# Patient Record
Sex: Female | Born: 1955 | Race: Black or African American | Hispanic: No | Marital: Married | Smoking: Former smoker
Health system: Southern US, Community
[De-identification: ages and names within clinical notes are randomized; demographics above are authoritative.]

## PROBLEM LIST (undated history)

## (undated) DIAGNOSIS — F410 Panic disorder [episodic paroxysmal anxiety] without agoraphobia: Secondary | ICD-10-CM

## (undated) DIAGNOSIS — M1990 Unspecified osteoarthritis, unspecified site: Secondary | ICD-10-CM

## (undated) DIAGNOSIS — E119 Type 2 diabetes mellitus without complications: Secondary | ICD-10-CM

## (undated) DIAGNOSIS — I10 Essential (primary) hypertension: Secondary | ICD-10-CM

## (undated) DIAGNOSIS — E7800 Pure hypercholesterolemia, unspecified: Secondary | ICD-10-CM

## (undated) HISTORY — PX: ABDOMINAL HYSTERECTOMY: SHX81

## (undated) HISTORY — PX: CHOLECYSTECTOMY: SHX55

## (undated) HISTORY — PX: TUBAL LIGATION: SHX77

---

## 1978-04-18 HISTORY — PX: TUBAL LIGATION: SHX77

## 1997-07-17 ENCOUNTER — Ambulatory Visit (HOSPITAL_COMMUNITY): Admission: RE | Admit: 1997-07-17 | Discharge: 1997-07-17 | Payer: Self-pay | Admitting: Internal Medicine

## 1998-03-18 ENCOUNTER — Other Ambulatory Visit: Admission: RE | Admit: 1998-03-18 | Discharge: 1998-03-18 | Payer: Self-pay | Admitting: Gynecology

## 1998-08-13 ENCOUNTER — Encounter: Payer: Self-pay | Admitting: Internal Medicine

## 1998-08-13 ENCOUNTER — Ambulatory Visit (HOSPITAL_COMMUNITY): Admission: RE | Admit: 1998-08-13 | Discharge: 1998-08-13 | Payer: Self-pay | Admitting: Internal Medicine

## 1998-09-28 ENCOUNTER — Emergency Department (HOSPITAL_COMMUNITY): Admission: EM | Admit: 1998-09-28 | Discharge: 1998-09-28 | Payer: Self-pay | Admitting: Emergency Medicine

## 1998-09-28 ENCOUNTER — Encounter: Payer: Self-pay | Admitting: Emergency Medicine

## 1999-06-15 ENCOUNTER — Other Ambulatory Visit: Admission: RE | Admit: 1999-06-15 | Discharge: 1999-06-15 | Payer: Self-pay | Admitting: Obstetrics and Gynecology

## 1999-08-16 ENCOUNTER — Ambulatory Visit (HOSPITAL_COMMUNITY): Admission: RE | Admit: 1999-08-16 | Discharge: 1999-08-16 | Payer: Self-pay | Admitting: Internal Medicine

## 1999-08-16 ENCOUNTER — Encounter: Payer: Self-pay | Admitting: Internal Medicine

## 2000-08-24 ENCOUNTER — Encounter: Payer: Self-pay | Admitting: Internal Medicine

## 2000-08-24 ENCOUNTER — Ambulatory Visit (HOSPITAL_COMMUNITY): Admission: RE | Admit: 2000-08-24 | Discharge: 2000-08-24 | Payer: Self-pay | Admitting: Internal Medicine

## 2000-09-16 ENCOUNTER — Encounter: Payer: Self-pay | Admitting: Emergency Medicine

## 2000-09-16 ENCOUNTER — Emergency Department (HOSPITAL_COMMUNITY): Admission: EM | Admit: 2000-09-16 | Discharge: 2000-09-16 | Payer: Self-pay | Admitting: Emergency Medicine

## 2000-09-19 ENCOUNTER — Encounter (INDEPENDENT_AMBULATORY_CARE_PROVIDER_SITE_OTHER): Payer: Self-pay | Admitting: Specialist

## 2000-09-19 ENCOUNTER — Encounter: Payer: Self-pay | Admitting: *Deleted

## 2000-09-20 ENCOUNTER — Inpatient Hospital Stay (HOSPITAL_COMMUNITY): Admission: RE | Admit: 2000-09-20 | Discharge: 2000-09-21 | Payer: Self-pay | Admitting: *Deleted

## 2001-06-20 ENCOUNTER — Emergency Department (HOSPITAL_COMMUNITY): Admission: EM | Admit: 2001-06-20 | Discharge: 2001-06-20 | Payer: Self-pay | Admitting: *Deleted

## 2001-06-20 ENCOUNTER — Encounter: Payer: Self-pay | Admitting: Emergency Medicine

## 2001-08-24 ENCOUNTER — Encounter: Admission: RE | Admit: 2001-08-24 | Discharge: 2001-08-24 | Payer: Self-pay | Admitting: Internal Medicine

## 2001-08-24 ENCOUNTER — Encounter: Payer: Self-pay | Admitting: Internal Medicine

## 2001-08-27 ENCOUNTER — Ambulatory Visit (HOSPITAL_COMMUNITY): Admission: RE | Admit: 2001-08-27 | Discharge: 2001-08-27 | Payer: Self-pay | Admitting: Internal Medicine

## 2001-08-27 ENCOUNTER — Encounter: Payer: Self-pay | Admitting: Internal Medicine

## 2005-01-03 ENCOUNTER — Ambulatory Visit: Payer: Self-pay | Admitting: Family Medicine

## 2005-01-05 ENCOUNTER — Ambulatory Visit: Payer: Self-pay | Admitting: Family Medicine

## 2005-01-17 ENCOUNTER — Ambulatory Visit: Payer: Self-pay | Admitting: Family Medicine

## 2005-01-31 ENCOUNTER — Ambulatory Visit: Payer: Self-pay | Admitting: Family Medicine

## 2005-03-25 ENCOUNTER — Encounter (INDEPENDENT_AMBULATORY_CARE_PROVIDER_SITE_OTHER): Payer: Self-pay | Admitting: Family Medicine

## 2005-03-25 ENCOUNTER — Ambulatory Visit: Payer: Self-pay | Admitting: Family Medicine

## 2005-03-25 LAB — CONVERTED CEMR LAB: Pap Smear: NORMAL

## 2005-04-05 ENCOUNTER — Ambulatory Visit: Payer: Self-pay | Admitting: *Deleted

## 2005-05-04 ENCOUNTER — Ambulatory Visit (HOSPITAL_COMMUNITY): Admission: RE | Admit: 2005-05-04 | Discharge: 2005-05-04 | Payer: Self-pay | Admitting: Family Medicine

## 2005-05-09 ENCOUNTER — Ambulatory Visit: Payer: Self-pay | Admitting: Internal Medicine

## 2005-05-18 ENCOUNTER — Ambulatory Visit: Payer: Self-pay | Admitting: Family Medicine

## 2005-06-27 ENCOUNTER — Ambulatory Visit: Payer: Self-pay | Admitting: Family Medicine

## 2005-06-28 ENCOUNTER — Ambulatory Visit (HOSPITAL_COMMUNITY): Admission: RE | Admit: 2005-06-28 | Discharge: 2005-06-28 | Payer: Self-pay | Admitting: Family Medicine

## 2005-10-30 ENCOUNTER — Emergency Department (HOSPITAL_COMMUNITY): Admission: EM | Admit: 2005-10-30 | Discharge: 2005-10-30 | Payer: Self-pay | Admitting: Emergency Medicine

## 2005-11-02 ENCOUNTER — Ambulatory Visit: Payer: Self-pay | Admitting: Family Medicine

## 2006-01-13 ENCOUNTER — Ambulatory Visit: Payer: Self-pay | Admitting: Internal Medicine

## 2006-01-16 ENCOUNTER — Ambulatory Visit: Payer: Self-pay | Admitting: Internal Medicine

## 2006-01-24 ENCOUNTER — Ambulatory Visit: Payer: Self-pay | Admitting: Family Medicine

## 2006-03-01 ENCOUNTER — Ambulatory Visit: Payer: Self-pay | Admitting: Family Medicine

## 2006-05-05 ENCOUNTER — Ambulatory Visit (HOSPITAL_COMMUNITY): Admission: RE | Admit: 2006-05-05 | Discharge: 2006-05-05 | Payer: Self-pay | Admitting: Family Medicine

## 2006-06-05 ENCOUNTER — Emergency Department (HOSPITAL_COMMUNITY): Admission: EM | Admit: 2006-06-05 | Discharge: 2006-06-05 | Payer: Self-pay | Admitting: Emergency Medicine

## 2006-12-22 ENCOUNTER — Encounter (INDEPENDENT_AMBULATORY_CARE_PROVIDER_SITE_OTHER): Payer: Self-pay | Admitting: Family Medicine

## 2006-12-22 DIAGNOSIS — F3289 Other specified depressive episodes: Secondary | ICD-10-CM | POA: Insufficient documentation

## 2006-12-22 DIAGNOSIS — J309 Allergic rhinitis, unspecified: Secondary | ICD-10-CM | POA: Insufficient documentation

## 2006-12-22 DIAGNOSIS — Z8679 Personal history of other diseases of the circulatory system: Secondary | ICD-10-CM | POA: Insufficient documentation

## 2006-12-22 DIAGNOSIS — I10 Essential (primary) hypertension: Secondary | ICD-10-CM | POA: Insufficient documentation

## 2006-12-22 DIAGNOSIS — F329 Major depressive disorder, single episode, unspecified: Secondary | ICD-10-CM | POA: Insufficient documentation

## 2007-01-03 ENCOUNTER — Encounter (INDEPENDENT_AMBULATORY_CARE_PROVIDER_SITE_OTHER): Payer: Self-pay | Admitting: *Deleted

## 2007-02-12 ENCOUNTER — Ambulatory Visit: Payer: Self-pay | Admitting: Family Medicine

## 2007-05-08 ENCOUNTER — Ambulatory Visit (HOSPITAL_COMMUNITY): Admission: RE | Admit: 2007-05-08 | Discharge: 2007-05-08 | Payer: Self-pay | Admitting: Family Medicine

## 2007-07-31 ENCOUNTER — Emergency Department (HOSPITAL_COMMUNITY): Admission: EM | Admit: 2007-07-31 | Discharge: 2007-08-01 | Payer: Self-pay | Admitting: Emergency Medicine

## 2007-08-13 ENCOUNTER — Ambulatory Visit: Payer: Self-pay | Admitting: Family Medicine

## 2007-08-13 LAB — CONVERTED CEMR LAB
ALT: 13 units/L (ref 0–35)
AST: 13 units/L (ref 0–37)
Albumin: 4 g/dL (ref 3.5–5.2)
Alkaline Phosphatase: 44 units/L (ref 39–117)
BUN: 11 mg/dL (ref 6–23)
CO2: 23 meq/L (ref 19–32)
Calcium: 9.1 mg/dL (ref 8.4–10.5)
Free T4: 0.99 ng/dL (ref 0.89–1.80)
Glucose, Bld: 146 mg/dL — ABNORMAL HIGH (ref 70–99)
Helicobacter Pylori Antibody-IgG: <0.4
Hemoglobin: 12.2 g/dL (ref 12.0–15.0)
Lymphocytes Relative: 34 % (ref 12–46)
Monocytes Absolute: 0.3 10*3/uL (ref 0.1–1.0)
Monocytes Relative: 6 % (ref 3–12)
Neutro Abs: 3.1 10*3/uL (ref 1.7–7.7)
TSH: 1.652 microintl units/mL (ref 0.350–5.50)
Total Protein: 7.2 g/dL (ref 6.0–8.3)
WBC: 5.4 10*3/uL (ref 4.0–10.5)

## 2008-02-08 ENCOUNTER — Ambulatory Visit: Payer: Self-pay | Admitting: Family Medicine

## 2008-05-13 ENCOUNTER — Ambulatory Visit (HOSPITAL_COMMUNITY): Admission: RE | Admit: 2008-05-13 | Discharge: 2008-05-13 | Payer: Self-pay | Admitting: Internal Medicine

## 2008-05-20 ENCOUNTER — Ambulatory Visit: Payer: Self-pay | Admitting: Internal Medicine

## 2008-05-20 ENCOUNTER — Encounter (INDEPENDENT_AMBULATORY_CARE_PROVIDER_SITE_OTHER): Payer: Self-pay | Admitting: Family Medicine

## 2008-06-08 ENCOUNTER — Emergency Department (HOSPITAL_COMMUNITY): Admission: EM | Admit: 2008-06-08 | Discharge: 2008-06-08 | Payer: Self-pay | Admitting: Emergency Medicine

## 2008-06-10 ENCOUNTER — Emergency Department (HOSPITAL_COMMUNITY): Admission: EM | Admit: 2008-06-10 | Discharge: 2008-06-10 | Payer: Self-pay | Admitting: Emergency Medicine

## 2008-07-04 ENCOUNTER — Emergency Department (HOSPITAL_BASED_OUTPATIENT_CLINIC_OR_DEPARTMENT_OTHER): Admission: EM | Admit: 2008-07-04 | Discharge: 2008-07-04 | Payer: Self-pay | Admitting: Emergency Medicine

## 2009-04-29 ENCOUNTER — Encounter: Admission: RE | Admit: 2009-04-29 | Discharge: 2009-04-29 | Payer: Self-pay | Admitting: Family Medicine

## 2009-05-14 ENCOUNTER — Ambulatory Visit (HOSPITAL_COMMUNITY): Admission: RE | Admit: 2009-05-14 | Discharge: 2009-05-14 | Payer: Self-pay | Admitting: Family Medicine

## 2009-06-02 ENCOUNTER — Ambulatory Visit: Payer: Self-pay | Admitting: Vascular Surgery

## 2009-08-27 ENCOUNTER — Encounter
Admission: RE | Admit: 2009-08-27 | Discharge: 2009-08-27 | Payer: Self-pay | Source: Home / Self Care | Admitting: Family Medicine

## 2010-05-06 ENCOUNTER — Other Ambulatory Visit (HOSPITAL_COMMUNITY): Payer: Self-pay | Admitting: Family Medicine

## 2010-05-06 DIAGNOSIS — Z0000 Encounter for general adult medical examination without abnormal findings: Secondary | ICD-10-CM

## 2010-05-20 ENCOUNTER — Ambulatory Visit (HOSPITAL_COMMUNITY)
Admission: RE | Admit: 2010-05-20 | Discharge: 2010-05-20 | Disposition: A | Discharge status: Home / Self Care | Payer: No Typology Code available for payment source | Source: Ambulatory Visit | Attending: Family Medicine | Admitting: Family Medicine

## 2010-05-20 ENCOUNTER — Ambulatory Visit (HOSPITAL_COMMUNITY): Admission: RE | Admit: 2010-05-20 | Payer: Self-pay | Source: Home / Self Care | Admitting: Family Medicine

## 2010-05-20 DIAGNOSIS — Z0000 Encounter for general adult medical examination without abnormal findings: Secondary | ICD-10-CM

## 2010-05-20 DIAGNOSIS — Z1231 Encounter for screening mammogram for malignant neoplasm of breast: Secondary | ICD-10-CM | POA: Insufficient documentation

## 2010-06-10 ENCOUNTER — Ambulatory Visit: Payer: No Typology Code available for payment source

## 2010-06-10 ENCOUNTER — Other Ambulatory Visit: Payer: No Typology Code available for payment source

## 2010-06-18 ENCOUNTER — Ambulatory Visit (INDEPENDENT_AMBULATORY_CARE_PROVIDER_SITE_OTHER): Payer: No Typology Code available for payment source

## 2010-06-18 ENCOUNTER — Other Ambulatory Visit (INDEPENDENT_AMBULATORY_CARE_PROVIDER_SITE_OTHER): Payer: No Typology Code available for payment source

## 2010-06-18 DIAGNOSIS — I6529 Occlusion and stenosis of unspecified carotid artery: Secondary | ICD-10-CM

## 2010-06-18 NOTE — Assessment & Plan Note (Signed)
OFFICE VISIT  Carol Wilkins, Carol Wilkins DOB:  [birth year]                                       06/18/2010 ZOXWR#:[id number]  CHIEF COMPLAINT:  Follow-up mild carotid artery disease.  Patient is a 55 year old woman with a history of hypertension and occasional dizziness who approximately a year ago was seen by her medical doctor and found to have a bruit in the left side of her neck and had a duplex scan which showed an approximately 50% stenosis.  She is sent to [place] for further evaluation.  Vascular lab at that time showed left-sided approximately 50% stenosis and 30% to 40% on the right.  She denies any history of amaurosis, hemiplegia, hemiparesis, speech difficulties, and has had no symptoms in the last year.  FAMILY HISTORY:  Her mother did have a stroke.  Vascular duplex scan of the carotid artery showed peak systolic of 57 over end-diastolic of 15 on the right, and a peak systolic of 100 over end-diastolic of 26 on the left with no hemodynamically significant stenosis noted in either carotid.  PAST MEDICAL HISTORY:  Significant for high blood pressure, some headaches (even though her blood pressure is well-controlled), occasional dizziness.  She denies diabetes, COPD, or coronary artery disease.  PHYSICAL EXAMINATION:  This is a well-developed, well-nourished woman in no acute distress.  Vital signs:  Heart rate was 75, sats were 99%, respiratory rate was 10.  HEENT is grossly within normal limits.  She is alert and oriented x3.  She had no tongue deviation.  No facial droop. She had a soft carotid bruit on the left side, none on the right.  She had no cardiac murmurs, and her heart rate and rhythm were regular.  She had good and equal strength in bilateral upper and lower extremities.  ASSESSMENT/PLAN:  Mild to moderate left carotid stenosis with no hemodynamically significant stenoses in either carotid in this asymptomatic woman who is right-hand  dominant.  Plan is to have her follow up in 1 year for a carotid study.  Della Goo, PA-C  Fransisco Hertz, MD Electronically Signed  RR/MEDQ  D:  06/18/2010  T:  06/18/2010  Job:  [id number]

## 2010-06-22 NOTE — Procedures (Unsigned)
CAROTID DUPLEX EXAM  INDICATION:  Carotid disease.  HISTORY: Diabetes:  No. Cardiac:  No. Hypertension:  Yes. Smoking:  Previous. Previous Surgery:  No. CV History:  Occasional dizziness. Amaurosis Fugax No, Paresthesias No, Hemiparesis No                                      RIGHT             LEFT Brachial systolic pressure:         154               150 Brachial Doppler waveforms:         Normal            Normal Vertebral direction of flow:        Antegrade         Antegrade DUPLEX VELOCITIES (cm/sec) CCA peak systolic                   88                64 ECA peak systolic                   66                155 ICA peak systolic                   57                100 ICA end diastolic                   15                26 PLAQUE MORPHOLOGY:                  Heterogeneous     Heterogeneous PLAQUE AMOUNT:                      Mild              Mild / moderate PLAQUE LOCATION:                    ICA               Distal CCA  IMPRESSION:  No hemodynamically significant stenoses noted in the bilateral internal carotid arteries with plaque formations as described above.  ___________________________________________ Quita Skye. Hart Rochester, M.D.  CH/MEDQ  D:  06/21/2010  T:  06/21/2010  Job:  [id number]

## 2010-08-31 NOTE — Consult Note (Signed)
NEW PATIENT CONSULTATION   Carol, Wilkins  DOB:  Mar 05, 1956                                       06/02/2009  OZHYQ#:[id number]   The patient is a 55 year old female referred by Dr. Mirna Mires for  carotid occlusive disease.  She states that a carotid bruit was heard on  physical examination by Dr. Loleta Chance and carotid duplex exam was performed  at [facility].  This was performed on 04/29/2009.  I have  reviewed the results of the study and have also reviewed the clinical  records provided by Dr. Loleta Chance.  The patient is noted to have mild carotid  occlusive disease bilaterally with the left side approximating 50% in  severity and the right side approximately 30%-40%.  She had antegrade  flow in both tibial arteries.  She denies any history of stroke,  hemiparesis, aphasia, amaurosis fugax, diplopia, blurred vision, syncope  or other neurologic symptoms.   CHRONIC STABLE MEDICAL PROBLEMS:  1. Hypertension.  2. Shingles, still takes gabapentin 300 mg t.i.d.  3. Negative for coronary artery disease, diabetes, COPD, stroke.  4. Remote history of asthma.   FAMILY HISTORY:  Positive for stroke, coronary artery disease and  diabetes in her mother, coronary artery disease and diabetes in two  sisters.   SOCIAL HISTORY:  She is separated, has three children.  Does work  actively.  She has not smoked in 25 years but did smoke up to a pack of  cigarettes per day prior to that.  Drinks occasional alcohol.   REVIEW OF SYSTEMS:  Positive for weight gain and headaches.  Denies any  chest pain, dyspnea on exertion, PND, orthopnea, claudication.  Has left  hip, thigh and calf discomfort on occasion which starts in her back.  All other systems in a 14 point review of systems are negative.   PHYSICAL EXAMINATION:  Vital signs:  Blood pressure 107/66, heart rate  62, respirations 14, temperature 98.  General:  She is a well-developed,  well-nourished middle-aged female  in no apparent distress.  HEENT:  Exam  is normal.  EOMs intact.  Conjunctivae normal.  Neck:  Is supple with 3+  carotid pulses.  No bruits are audible.  No palpable adenopathy.  Chest:  Clear to auscultation.  Cardiovascular:  Regular rhythm.  No murmurs.  No peripheral edema is noted.  Abdomen:  Soft, nontender with no masses.  Musculoskeletal:  Exam is free of deformities.  Neurological:  Normal.  Skin:  Free of rashes.   As noted I did review her carotid duplex report and the velocity in my  interpretation would be that her left internal carotid stenosis is  approximately 50% and the right in the 40% range.  This is asymptomatic  and does not require treatment at this time.  If she develops any  symptoms she will be in touch with [place].  I have recommended that she take  an aspirin 81 mg per day.  I have ordered a repeat study in 1 year.  She  will be seen in the PA clinic with carotid duplex exam unless she  develops any symptoms in the interim.     Quita Skye Hart Rochester, M.D.  Electronically Signed   JDL/MEDQ  D:  06/02/2009  T:  06/03/2009  Job:  [id number]   cc:   Annia Friendly. Loleta Chance, MD

## 2010-09-03 NOTE — Op Note (Signed)
[place]. [facility]  Patient:    Carol, Wilkins                      MRN: [id number] Proc. Date: 09/19/00 Adm. Date:  [id number] Attending:  Kandis Mannan CC:         Lilly Cove, M.D.   Operative Report  CCS# [id number]  PREOPERATIVE DIAGNOSIS:  Chronic cholecystitis with cholelithiasis.  POSTOPERATIVE DIAGNOSIS:  Acute cholecystitis with cholelithiasis.  OPERATION PERFORMED:  Laparoscopic cholecystectomy.  SURGEON:  Maisie Fus B. Samuella Cota, M.D.  ASSISTANT:  Milus Mallick, M.D.  ANESTHESIA:  General.  ANESTHESIOLOGIST:  Dr. Jacklynn Bue and CRNA.  DESCRIPTION OF PROCEDURE:  The patient was taken to the operating room and placed on the table in supine position.  After satisfactory general anesthetic with intubation, the entire abdomen was prepped and draped as a sterile field. The small vertical infraumbilical incision was made through skin and subcutaneous tissues and then the midline fascia.  A finger was placed in the peritoneal cavity.  There was some adhesion to the anterior abdominal wall inferiorly.  A pursestring suture of 0 Vicryl was placed and the Hasson trocar placed in the abdomen and the abdomen insufflated to 14 mmHg pressure.  A second 10 mm trocar was placed just to the right of midline in the subxiphoid area.  Two 5 mm trocars were placed laterally.  The gallbladder was distended with omentum adherent to essentially the entire gallbladder.  Some omentum was taken down and a needle was used to decompress the gallbladder so it could be grasped.  With the gallbladder grasped, gentle dissection was carried out to remove the omentum from the gallbladder.  There was a large stone impacted in the distal gallbladder obstructing the cystic duct.  The cystic artery was identified superior to the cystic duct and the cystic artery was doubly clipped on the remaining side, once on the gallbladder side and divided.  The cystic duct was then  dissected free.  The junction to the gallbladder was readily identified.  The cystic duct was triply clipped on the remaining side, once on the gallbladder side and divided.  The gallbladder was then dissected from the bed.  It was a very tedious dissection with a considerable amount of edema in the gallbladder wall and the liver bed.  Two other arteries were identified and doubly clipped at the end of dissection.  Bleeding was controlled with the cautery.  The gallbladder was then placed in the endocatch bag and brought up to the infraumbilical incision.  It was necessary to enlarge the fascial opening in order to deliver the gallbladder and the endocatch pouch.  The wound was copiously irrigated.  Hemostasis was obtained but because of the acute changes, it was elected to place a #10 Jackson-Pratt drain which was brought out through the lateral 5 mm trocar site.  The drain was placed through the subhepatic space.  It was sutured to the skin using 3-0 nylon.  The midline infraumbilical incision was then closed with three figure-of-eight sutures of 0 Vicryl.  The 5 mm trocar was removed.  The abdomen was deflated.  The two midline incisions were closed with running subcuticular sutures of 4-0 Vicryl and a single 4-0 Vicryl was placed at the 5 mm trocar laterally.  The Jackson-Pratt drain was connected to bulb suction.  Benzoin and 1/4 Steri-Strips were applied at all trocar sites.  The patient seemed to tolerate the procedure well and was taken to  the PACU in satisfactory condition. DD:  09/19/00 TD:  09/19/00 Job: [id number]

## 2010-12-25 ENCOUNTER — Emergency Department (HOSPITAL_COMMUNITY)
Admission: EM | Admit: 2010-12-25 | Discharge: 2010-12-25 | Disposition: A | Discharge status: Home / Self Care | Payer: BC Managed Care – PPO | Attending: Emergency Medicine | Admitting: Emergency Medicine

## 2010-12-25 ENCOUNTER — Inpatient Hospital Stay (INDEPENDENT_AMBULATORY_CARE_PROVIDER_SITE_OTHER)
Admission: RE | Admit: 2010-12-25 | Discharge: 2010-12-25 | Disposition: A | Discharge status: Home / Self Care | Payer: BC Managed Care – PPO | Source: Ambulatory Visit | Attending: Family Medicine | Admitting: Family Medicine

## 2010-12-25 ENCOUNTER — Emergency Department (HOSPITAL_COMMUNITY): Payer: BC Managed Care – PPO

## 2010-12-25 DIAGNOSIS — R220 Localized swelling, mass and lump, head: Secondary | ICD-10-CM | POA: Insufficient documentation

## 2010-12-25 DIAGNOSIS — Z79899 Other long term (current) drug therapy: Secondary | ICD-10-CM | POA: Insufficient documentation

## 2010-12-25 DIAGNOSIS — R51 Headache: Secondary | ICD-10-CM | POA: Insufficient documentation

## 2010-12-25 DIAGNOSIS — K1120 Sialoadenitis, unspecified: Secondary | ICD-10-CM | POA: Insufficient documentation

## 2010-12-25 DIAGNOSIS — I10 Essential (primary) hypertension: Secondary | ICD-10-CM | POA: Insufficient documentation

## 2010-12-25 LAB — CBC
HCT: 34.3 % — ABNORMAL LOW (ref 36.0–46.0)
MCH: 27.8 pg (ref 26.0–34.0)
MCHC: 33.2 g/dL (ref 30.0–36.0)
RBC: 4.1 MIL/uL (ref 3.87–5.11)

## 2010-12-25 LAB — DIFFERENTIAL
Basophils Relative: 0 % (ref 0–1)
Eosinophils Relative: 0 % (ref 0–5)
Neutro Abs: 8.1 10*3/uL — ABNORMAL HIGH (ref 1.7–7.7)
Neutrophils Relative %: 78 % — ABNORMAL HIGH (ref 43–77)

## 2010-12-25 LAB — POCT I-STAT, CHEM 8
Glucose, Bld: 90 mg/dL (ref 70–99)
Potassium: 3.4 mEq/L — ABNORMAL LOW (ref 3.5–5.1)
TCO2: 26 mmol/L (ref 0–100)

## 2010-12-25 MED ORDER — IOHEXOL 300 MG/ML  SOLN
80.0000 mL | Freq: Once | INTRAMUSCULAR | Status: AC | PRN
  Administered 2010-12-25: 80 mL via INTRAVENOUS

## 2011-01-11 LAB — POCT I-STAT, CHEM 8
BUN: 13
Calcium, Ion: 1.24
Chloride: 102
Creatinine, Ser: 1
Glucose, Bld: 135 — ABNORMAL HIGH
HCT: 39
Hemoglobin: 13.3
Potassium: 3.6
Sodium: 141
TCO2: 28

## 2011-01-11 LAB — CBC
MCHC: 33
Platelets: 250
RBC: 4.39
RDW: 13.7

## 2011-01-11 LAB — DIFFERENTIAL
Eosinophils Absolute: 0.1
Eosinophils Relative: 3
Lymphs Abs: 1.4
Monocytes Absolute: 0.4
Monocytes Relative: 7
Neutro Abs: 3.8

## 2011-01-11 LAB — POCT CARDIAC MARKERS
CKMB, poc: 7.2
CKMB, poc: 6.8
Myoglobin, poc: 291
Myoglobin, poc: 240
Operator id: 277751
Operator id: 277751
Troponin i, poc: <0.05
Troponin i, poc: <0.05

## 2011-02-16 ENCOUNTER — Inpatient Hospital Stay (INDEPENDENT_AMBULATORY_CARE_PROVIDER_SITE_OTHER)
Admission: RE | Admit: 2011-02-16 | Discharge: 2011-02-16 | Disposition: A | Discharge status: Home / Self Care | Payer: BC Managed Care – PPO | Source: Ambulatory Visit | Attending: Emergency Medicine | Admitting: Emergency Medicine

## 2011-02-16 DIAGNOSIS — J40 Bronchitis, not specified as acute or chronic: Secondary | ICD-10-CM

## 2011-02-16 DIAGNOSIS — H109 Unspecified conjunctivitis: Secondary | ICD-10-CM

## 2011-02-16 LAB — POCT RAPID STREP A: Streptococcus, Group A Screen (Direct): NEGATIVE

## 2011-05-30 ENCOUNTER — Other Ambulatory Visit (HOSPITAL_COMMUNITY): Payer: Self-pay | Admitting: Family Medicine

## 2011-05-30 DIAGNOSIS — Z1231 Encounter for screening mammogram for malignant neoplasm of breast: Secondary | ICD-10-CM

## 2011-06-17 ENCOUNTER — Encounter: Payer: Self-pay | Admitting: Surgery

## 2011-06-20 ENCOUNTER — Other Ambulatory Visit: Payer: No Typology Code available for payment source

## 2011-06-28 ENCOUNTER — Ambulatory Visit (HOSPITAL_COMMUNITY)
Admission: RE | Admit: 2011-06-28 | Discharge: 2011-06-28 | Disposition: A | Discharge status: Home / Self Care | Payer: No Typology Code available for payment source | Source: Ambulatory Visit | Attending: Family Medicine | Admitting: Family Medicine

## 2011-06-28 DIAGNOSIS — Z1231 Encounter for screening mammogram for malignant neoplasm of breast: Secondary | ICD-10-CM | POA: Insufficient documentation

## 2011-09-14 ENCOUNTER — Encounter: Payer: Self-pay | Admitting: Family Medicine

## 2011-09-14 ENCOUNTER — Ambulatory Visit (INDEPENDENT_AMBULATORY_CARE_PROVIDER_SITE_OTHER): Payer: No Typology Code available for payment source | Admitting: Family Medicine

## 2011-09-14 VITALS — BP 167/89 | HR 65 | Ht 63.0 in | Wt 173.0 lb

## 2011-09-14 DIAGNOSIS — B0229 Other postherpetic nervous system involvement: Secondary | ICD-10-CM

## 2011-09-14 DIAGNOSIS — I10 Essential (primary) hypertension: Secondary | ICD-10-CM

## 2011-09-14 MED ORDER — GABAPENTIN 300 MG PO CAPS: 300.0000 mg | ORAL_CAPSULE | Freq: Three times a day (TID) | ORAL | Status: DC

## 2011-09-14 MED ORDER — LISINOPRIL-HYDROCHLOROTHIAZIDE 20-12.5 MG PO TABS: 1.0000 | ORAL_TABLET | Freq: Every day | ORAL | Status: DC

## 2011-09-14 NOTE — Patient Instructions (Signed)
Thank you for coming in today, it was nice to meet you. I would like to see you back in 2-3 weeks for a physical and to recheck your blood pressure, hopefully you will have met with D. Hill by that time.

## 2011-09-18 ENCOUNTER — Emergency Department (HOSPITAL_COMMUNITY): Payer: No Typology Code available for payment source

## 2011-09-18 ENCOUNTER — Emergency Department (HOSPITAL_COMMUNITY)
Admission: EM | Admit: 2011-09-18 | Discharge: 2011-09-18 | Disposition: A | Discharge status: Home / Self Care | Payer: No Typology Code available for payment source | Attending: Emergency Medicine | Admitting: Emergency Medicine

## 2011-09-18 ENCOUNTER — Encounter (HOSPITAL_COMMUNITY): Payer: Self-pay | Admitting: Emergency Medicine

## 2011-09-18 DIAGNOSIS — I10 Essential (primary) hypertension: Secondary | ICD-10-CM | POA: Insufficient documentation

## 2011-09-18 DIAGNOSIS — R110 Nausea: Secondary | ICD-10-CM | POA: Insufficient documentation

## 2011-09-18 DIAGNOSIS — R42 Dizziness and giddiness: Secondary | ICD-10-CM

## 2011-09-18 HISTORY — DX: Panic disorder (episodic paroxysmal anxiety): F41.0

## 2011-09-18 HISTORY — DX: Essential (primary) hypertension: I10

## 2011-09-18 LAB — POCT I-STAT, CHEM 8
Calcium, Ion: 1.27 mmol/L (ref 1.12–1.32)
Creatinine, Ser: 1 mg/dL (ref 0.50–1.10)
Glucose, Bld: 94 mg/dL (ref 70–99)
HCT: 42 % (ref 36.0–46.0)
Hemoglobin: 14.3 g/dL (ref 12.0–15.0)
Potassium: 3.8 mEq/L (ref 3.5–5.1)
TCO2: 29 mmol/L (ref 0–100)

## 2011-09-18 MED ORDER — MECLIZINE HCL 25 MG PO TABS: 25.0000 mg | ORAL_TABLET | Freq: Three times a day (TID) | ORAL | Status: DC | PRN

## 2011-09-18 MED ORDER — MECLIZINE HCL 25 MG PO TABS
25.0000 mg | ORAL_TABLET | Freq: Once | ORAL | Status: AC
  Administered 2011-09-18: 25 mg via ORAL
  Filled 2011-09-18: qty 1

## 2011-09-18 NOTE — ED Notes (Signed)
Pt presenting to ed with c/o dizziness and positive nausea 2 days. Pt denies chest pain at this time. Pt is alert and oriented at this time. Pt states she was diagnosed with vertigo a long time ago. Pt states positive headaches. Pt denies blurred vision.

## 2011-09-18 NOTE — Assessment & Plan Note (Addendum)
Hesitant to start both BP meds back at once to avoid hypotension. Patient without insurance, will change to lisinopril/hctz for now and have her return in 2 weeks to recheck blood pressure.  Instructed to schedule cpe for that time to bring health maintenance up to date, check labs once has orange card.

## 2011-09-18 NOTE — Progress Notes (Signed)
  Subjective:    Patient ID: Carol Wilkins, female    DOB: 17-Jun-1955, 56 y.o.   MRN: [id number]  HPI 1. HTN:  Was taking Olmesartan-HCTZ 40/25mg  and amlodipine 10mg  however has been out for the past 3 months.  She does not check her bp at home.  She reports occasional headaches, bilaterall in her neck and frontal area of her head.  She denies nausea or vomiting, chest pain, palpitations, shortness of breath.      Review of Systems Per HPI    Objective:   Physical Exam  Constitutional: She is oriented to person, place, and time. She appears well-developed and well-nourished.  HENT:  Head: Normocephalic and atraumatic.  Neck: Neck supple. No thyromegaly present.  Cardiovascular: Normal rate, regular rhythm and normal heart sounds.   Pulmonary/Chest: Effort normal and breath sounds normal. She has no wheezes. She has no rales.  Musculoskeletal: Normal range of motion. She exhibits no edema.  Neurological: She is alert and oriented to person, place, and time.          Assessment & Plan:

## 2011-09-18 NOTE — Discharge Instructions (Signed)

## 2011-09-18 NOTE — ED Provider Notes (Signed)
History     CSN: [id number]  Arrival date & time 09/18/11  1138   First MD Initiated Contact with Patient 09/18/11 1217      Chief Complaint  Patient presents with  . Dizziness     HPI Pt presenting to ed with c/o dizziness and positive nausea 2 days. Pt denies chest pain at this time. Pt is alert and oriented at this time. Pt states she was diagnosed with vertigo a long time ago. Pt states positive headaches. Pt denies blurred vision.   Past Medical History  Diagnosis Date  . Hypertension   . Anxiety attack     Past Surgical History  Procedure Date  . Cholecystectomy   . Tubal ligation   . Abdominal hysterectomy     No family history on file.  History  Substance Use Topics  . Smoking status: Former Smoker    Quit date: 09/13/1980  . Smokeless tobacco: Not on file  . Alcohol Use: No    OB History    Grav Para Term Preterm Abortions TAB SAB Ect Mult Living                  Review of Systems  Allergies  Penicillins  Home Medications   Current Outpatient Rx  Name Route Sig Dispense Refill  . ASPIRIN EC 81 MG PO TBEC Oral Take 81 mg by mouth daily.    Marland Kitchen LISINOPRIL-HYDROCHLOROTHIAZIDE 20-12.5 MG PO TABS Oral Take 1 tablet by mouth daily. 30 tablet 1  . OVER THE COUNTER MEDICATION Oral Take 1 capsule by mouth 2 (two) times daily. Garcinia Cambogia    . MECLIZINE HCL 25 MG PO TABS Oral Take 1 tablet (25 mg total) by mouth 3 (three) times daily as needed. 30 tablet 0    BP 171/61  Pulse 61  Temp(Src) 98.3 F (36.8 C) (Oral)  Resp 20  SpO2 100%  Physical Exam  Nursing note and vitals reviewed. Constitutional: She is oriented to person, place, and time. She appears well-developed and well-nourished. No distress.  HENT:  Head: Normocephalic and atraumatic.  Eyes: Pupils are equal, round, and reactive to light. Right eye exhibits nystagmus. Left eye exhibits nystagmus.       Inducible nystagmus noted with tilt test  Neck: Normal range of motion.    Cardiovascular: Normal rate and intact distal pulses.        Sinus rhythm Rate = 57 QRS = 78 QTC = 409 Axis = normal impression: Sinus bradycardia with decreased heart rate since previous tracing in April 2009.  Pulmonary/Chest: No respiratory distress.  Abdominal: Normal appearance. She exhibits no distension.  Musculoskeletal: Normal range of motion.  Neurological: She is alert and oriented to person, place, and time. No cranial nerve deficit.  Skin: Skin is warm and dry. No rash noted.  Psychiatric: She has a normal mood and affect. Her behavior is normal.    ED Course  Procedures (including critical care time) Scheduled Meds:    . meclizine  25 mg Oral Once   Continuous Infusions:  PRN Meds:.   Labs Reviewed  POCT I-STAT, CHEM 8   Ct Head Wo Contrast  09/18/2011  *RADIOLOGY REPORT*  Clinical Data: 56 year old female with pain, dizziness, nausea x2 days.  CT HEAD WITHOUT CONTRAST  Technique:  Contiguous axial images were obtained from the base of the skull through the vertex without contrast.  Comparison: Face CT 12/25/2010.  Head CT 06/08/2008.  Findings: Stable paranasal sinuses and mastoids, clear except for some  chronic fluid in the inferior left mastoid air cells. No acute osseous abnormality identified.  Visualized orbits and scalp soft tissues are within normal limits.  Stable and normal cerebral volume.  No ventriculomegaly. No midline shift, mass effect, or evidence of mass lesion.  No acute intracranial hemorrhage identified.  Mild chronic subcortical white matter hypodensity at the right frontal operculum is stable. No evidence of cortically based acute infarction identified.  Normal gray-white matter differentiation elsewhere. No suspicious intracranial vascular hyperdensity.  Mild Calcified atherosclerosis at the skull base. Chronic partially empty sella is stable.  IMPRESSION: Stable and largely unremarkable noncontrast CT appearance of the brain.  Original Report  Authenticated By: Harley Hallmark, M.D.     1. Vertigo   2. Hypertension       MDM  After treatment in the ED the patient feels back to baseline and wants to go home.         Nelia Shi, MD 09/18/11 1440

## 2011-09-20 ENCOUNTER — Emergency Department (HOSPITAL_COMMUNITY)
Admission: EM | Admit: 2011-09-20 | Discharge: 2011-09-20 | Disposition: A | Discharge status: Home / Self Care | Payer: Self-pay | Attending: Emergency Medicine | Admitting: Emergency Medicine

## 2011-09-20 ENCOUNTER — Encounter (HOSPITAL_COMMUNITY): Payer: Self-pay | Admitting: Emergency Medicine

## 2011-09-20 DIAGNOSIS — Z87891 Personal history of nicotine dependence: Secondary | ICD-10-CM | POA: Insufficient documentation

## 2011-09-20 DIAGNOSIS — I10 Essential (primary) hypertension: Secondary | ICD-10-CM | POA: Insufficient documentation

## 2011-09-20 DIAGNOSIS — H6690 Otitis media, unspecified, unspecified ear: Secondary | ICD-10-CM | POA: Insufficient documentation

## 2011-09-20 MED ORDER — ACETAMINOPHEN-CODEINE 120-12 MG/5ML PO SOLN: 10.0000 mL | ORAL | Status: AC | PRN

## 2011-09-20 MED ORDER — GUAIFENESIN ER 1200 MG PO TB12: 1.0000 | ORAL_TABLET | Freq: Two times a day (BID) | ORAL | Status: DC

## 2011-09-20 MED ORDER — AZITHROMYCIN 250 MG PO TABS: ORAL_TABLET | ORAL | Status: DC

## 2011-09-20 NOTE — ED Notes (Signed)
Pt seen Sunday for dizziness and dx with vertigo.  Pt now having left ear pain, worse at night.

## 2011-09-20 NOTE — ED Provider Notes (Signed)
Medical screening examination/treatment/procedure(s) were performed by non-physician practitioner and as supervising physician I was immediately available for consultation/collaboration.   Shalayah Beagley, MD 09/20/11 1501 

## 2011-09-20 NOTE — Discharge Instructions (Signed)
Return here as needed. Follow up with your doctor for a recheck. °

## 2011-09-20 NOTE — ED Provider Notes (Signed)
History     CSN: [id number]  Arrival date & time 09/20/11  4098   First MD Initiated Contact with Patient 09/20/11 [phone]      Chief Complaint  Patient presents with  . Otalgia    (Consider location/radiation/quality/duration/timing/severity/associated sxs/prior treatment) HPI The patient presents to the ER with L ear pain. The patient was seen recently for vertigo. The patient states that the vertigo is better than it was. The patient denies fever, cough, ST, SOB, weakness, visual changes, or CP. The patient states that she has pain that radiates to the front and back portions of her ear.  Past Medical History  Diagnosis Date  . Hypertension   . Anxiety attack     Past Surgical History  Procedure Date  . Cholecystectomy   . Tubal ligation   . Abdominal hysterectomy     History reviewed. No pertinent family history.  History  Substance Use Topics  . Smoking status: Former Smoker    Quit date: 09/13/1980  . Smokeless tobacco: Not on file  . Alcohol Use: No    OB History    Grav Para Term Preterm Abortions TAB SAB Ect Mult Living                  Review of Systems All other systems negative except as documented in the HPI. All pertinent positives and negatives as reviewed in the HPI.  Allergies  Penicillins  Home Medications   Current Outpatient Rx  Name Route Sig Dispense Refill  . ASPIRIN EC 81 MG PO TBEC Oral Take 81 mg by mouth daily.    Marland Kitchen LISINOPRIL-HYDROCHLOROTHIAZIDE 20-12.5 MG PO TABS Oral Take 1 tablet by mouth daily. 30 tablet 1  . MECLIZINE HCL 25 MG PO TABS Oral Take 25 mg by mouth 3 (three) times daily as needed. For dizziness      BP 167/75  Pulse 68  Temp(Src) 98.2 F (36.8 C) (Oral)  Resp 18  SpO2 100%  Physical Exam  Constitutional: She appears well-developed and well-nourished. No distress.  HENT:  Right Ear: External ear and ear canal normal. No drainage. A middle ear effusion is present. No decreased hearing is noted.  Left Ear:  External ear and ear canal normal. No drainage. No decreased hearing is noted.  Mouth/Throat: Uvula is midline, oropharynx is clear and moist and mucous membranes are normal.  Cardiovascular: Normal rate, regular rhythm and normal heart sounds.  Exam reveals no gallop and no friction rub.   No murmur heard. Pulmonary/Chest: Effort normal and breath sounds normal.    ED Course  Procedures (including critical care time)  Labs Reviewed - No data to display Ct Head Wo Contrast  09/18/2011  *RADIOLOGY REPORT*  Clinical Data: 56 year old female with pain, dizziness, nausea x2 days.  CT HEAD WITHOUT CONTRAST  Technique:  Contiguous axial images were obtained from the base of the skull through the vertex without contrast.  Comparison: Face CT 12/25/2010.  Head CT 06/08/2008.  Findings: Stable paranasal sinuses and mastoids, clear except for some chronic fluid in the inferior left mastoid air cells. No acute osseous abnormality identified.  Visualized orbits and scalp soft tissues are within normal limits.  Stable and normal cerebral volume.  No ventriculomegaly. No midline shift, mass effect, or evidence of mass lesion.  No acute intracranial hemorrhage identified.  Mild chronic subcortical white matter hypodensity at the right frontal operculum is stable. No evidence of cortically based acute infarction identified.  Normal gray-white matter differentiation elsewhere. No suspicious  intracranial vascular hyperdensity.  Mild Calcified atherosclerosis at the skull base. Chronic partially empty sella is stable.  IMPRESSION: Stable and largely unremarkable noncontrast CT appearance of the brain.  Original Report Authenticated By: Harley Hallmark, M.D.   The patient will be treated for Serous Otitis. The patient is advised to follow up with her PCP.     MDM          Carlyle Dolly, PA-C 09/20/11 1051

## 2011-10-10 ENCOUNTER — Encounter: Payer: Self-pay | Admitting: Family Medicine

## 2011-10-31 ENCOUNTER — Ambulatory Visit (INDEPENDENT_AMBULATORY_CARE_PROVIDER_SITE_OTHER): Payer: Self-pay | Admitting: Family Medicine

## 2011-10-31 ENCOUNTER — Encounter: Payer: Self-pay | Admitting: Family Medicine

## 2011-10-31 VITALS — BP 161/92 | HR 54 | Ht 63.0 in | Wt 174.0 lb

## 2011-10-31 DIAGNOSIS — R109 Unspecified abdominal pain: Secondary | ICD-10-CM

## 2011-10-31 DIAGNOSIS — I10 Essential (primary) hypertension: Secondary | ICD-10-CM

## 2011-10-31 DIAGNOSIS — E669 Obesity, unspecified: Secondary | ICD-10-CM

## 2011-10-31 LAB — CBC
HCT: 36.7 % (ref 36.0–46.0)
Hemoglobin: 12.2 g/dL (ref 12.0–15.0)
MCH: 26.5 pg (ref 26.0–34.0)
MCHC: 33.2 g/dL (ref 30.0–36.0)
RBC: 4.61 MIL/uL (ref 3.87–5.11)

## 2011-10-31 MED ORDER — LISINOPRIL-HYDROCHLOROTHIAZIDE 20-12.5 MG PO TABS: 2.0000 | ORAL_TABLET | Freq: Every day | ORAL | Status: DC

## 2011-10-31 NOTE — Patient Instructions (Addendum)
Thank you for coming in today, it was good to see you We are checking your liver and kidney function as well as your blood count and cholesterol I would like for you to increase your lisinopril/hctz to two tablets per day, I have sent in a new prescription for this. Please follow up in two weeks to have a bp check with our nurse. For your constipation you can try miralax, or increase your fiber. Be sure to take a calcium + vitamin d supplement such as caltrate +D I will see you back in 6 months or as needed.

## 2011-11-01 LAB — COMPREHENSIVE METABOLIC PANEL
AST: 23 U/L (ref 0–37)
Alkaline Phosphatase: 66 U/L (ref 39–117)
BUN: 18 mg/dL (ref 6–23)
Creat: 0.85 mg/dL (ref 0.50–1.10)
Glucose, Bld: 72 mg/dL (ref 70–99)
Total Bilirubin: 0.4 mg/dL (ref 0.3–1.2)

## 2011-11-02 DIAGNOSIS — R109 Unspecified abdominal pain: Secondary | ICD-10-CM | POA: Insufficient documentation

## 2011-11-02 NOTE — Progress Notes (Signed)
Patient ID: Carol Wilkins, female   DOB: 1955/05/06, 56 y.o.   MRN: [id number] SUBJECTIVE:  56 y.o. female for annual routine checkup. Current Outpatient Prescriptions  Medication Sig Dispense Refill  . aspirin EC 81 MG tablet Take 81 mg by mouth daily.      Marland Kitchen lisinopril-hydrochlorothiazide (ZESTORETIC) 20-12.5 MG per tablet Take 2 tablets by mouth daily.  60 tablet  6   Allergies: Penicillins  No LMP recorded. Patient has had a hysterectomy.  ROS:  Feeling well. No dyspnea or chest pain on exertion.  She does report continued abdominal pain and problems with constipation.  She denies black or bloody stools.  No urinary tract symptoms. GYN ROS: no breast pain or new or enlarging lumps on self exam. No neurological complaints.  OBJECTIVE:  The patient appears well, alert, oriented x 3, in no distress. BP 161/92  Pulse 54  Ht 5\' 3"  (1.6 m)  Wt 174 lb (78.926 kg)  BMI 30.82 kg/m2 ENT normal.  Neck supple. No adenopathy or thyromegaly. PERLA.  Lungs are clear, good air entry, no wheezes, rhonchi or rales.  CV:S1 and S2 normal, no murmurs, regular rate and rhythm.  Abdomen soft with mild LLQ tenderness.  No guarding, mass or organomegaly. Extremities show no edema, normal peripheral pulses.  Neurological is normal, no focal findings.  PELVIC EXAM: S/p TAH  ASSESSMENT:  well woman  PLAN:  counseled on breast self exam and adequate intake of calcium and vitamin D return annually or prn Will refer to GI for continued abdominal pain.

## 2011-11-16 ENCOUNTER — Encounter: Payer: Self-pay | Admitting: Family Medicine

## 2011-11-18 ENCOUNTER — Ambulatory Visit (INDEPENDENT_AMBULATORY_CARE_PROVIDER_SITE_OTHER): Payer: Self-pay | Admitting: *Deleted

## 2011-11-18 ENCOUNTER — Other Ambulatory Visit: Payer: Self-pay

## 2011-11-18 VITALS — BP 168/88 | HR 80

## 2011-11-18 DIAGNOSIS — I10 Essential (primary) hypertension: Secondary | ICD-10-CM

## 2011-11-18 LAB — BASIC METABOLIC PANEL
BUN: 19 mg/dL (ref 6–23)
CO2: 30 mEq/L (ref 19–32)
Calcium: 9.8 mg/dL (ref 8.4–10.5)
Glucose, Bld: 91 mg/dL (ref 70–99)
Sodium: 142 mEq/L (ref 135–145)

## 2011-11-18 MED ORDER — AMLODIPINE BESYLATE 5 MG PO TABS: 5.0000 mg | ORAL_TABLET | Freq: Every day | ORAL | Status: DC

## 2011-11-18 NOTE — Patient Instructions (Addendum)
WKeep taking lisinopril-HCTZ  Will add bp medicine amlodpine 5  Make appointment with PCP in 2 weeks

## 2011-11-18 NOTE — Assessment & Plan Note (Signed)
Was in for nurse visit, noted to still be hypertensive after doubling lisinopril HCTX 20/12.5, two weeks ago.  Compliant per nurse.  Will check Cr, K.  Add amlodipine 5.  Will follow-up with PCP in 2 weeks

## 2011-11-21 NOTE — Progress Notes (Signed)
Late entry :  Patient in for BP check on 08/02.2013.  BP manually LA 168/88 and RA 172/82 pulse 80.  Patient reports she is taking medication as directed. Dr. Earnest Bailey notified of findings and  started Amlopidine 5 mg in addition to current med.  Advised to follow up with PCP in two weeks.   Blood drawn  for BMET.

## 2011-12-05 ENCOUNTER — Encounter: Payer: Self-pay | Admitting: Family Medicine

## 2011-12-05 ENCOUNTER — Ambulatory Visit (INDEPENDENT_AMBULATORY_CARE_PROVIDER_SITE_OTHER): Payer: Self-pay | Admitting: Family Medicine

## 2011-12-05 VITALS — BP 136/82 | HR 62 | Ht 63.0 in | Wt 172.0 lb

## 2011-12-05 DIAGNOSIS — R109 Unspecified abdominal pain: Secondary | ICD-10-CM

## 2011-12-05 DIAGNOSIS — I10 Essential (primary) hypertension: Secondary | ICD-10-CM

## 2011-12-05 MED ORDER — AMLODIPINE BESYLATE 5 MG PO TABS: 5.0000 mg | ORAL_TABLET | Freq: Every day | ORAL | Status: DC

## 2011-12-05 NOTE — Patient Instructions (Signed)
Thank you for coming in today, it was good to see you Your blood pressure looks great today You can try increasing your fiber by trying metamucil or fiber one, when starting use about 1/3 of the recommended dose and go up each week until a full dose is reached to prevent bloating I would like for you to have a colonoscopy as well some place in [place] that do this are: -[facility]: [phone] -[facility]: [phone] -[facility]: [phone]

## 2011-12-11 NOTE — Assessment & Plan Note (Signed)
Discussed with her slowly increasing fiber to prevent bloating/cramping.  Also discussed with her next course of action, whether it be stool cards vs colonoscopy.  She does not have insurance, but would elect to have a colonoscopy.  I gave her names of some groups around town that offer colonoscopies.

## 2011-12-11 NOTE — Progress Notes (Signed)
  Subjective:    Patient ID: Carol Wilkins, female    DOB: [birth year], 56 y.o.   MRN: [id number]  HPI 1.  HTN:  Has been compliant with hypertension medications however she is out of her amlodipine.  She has not been exercising much.  She has cut back on salt intake.  She denies chest pain, headache, vision changes, dizziness.   2.  Constipation/Abdominal Pain:  Has had continued constipation as well as occasional RLQ abdominal pain.  She has been using miralax which has helped some.  She is also trying to increase her fiber.  She has not seen any blood in her stool.  She typically has a bm every other day with miralax.  She denies nausea    Review of Systems Per HPI    Objective:   Physical Exam  Constitutional: She appears well-nourished. No distress.  Cardiovascular: Normal rate and regular rhythm.   No murmur heard. Pulmonary/Chest: Effort normal and breath sounds normal.  Abdominal: Soft. She exhibits no distension. There is no tenderness.  Musculoskeletal: She exhibits no edema.  Neurological: She is alert.          Assessment & Plan:

## 2011-12-11 NOTE — Assessment & Plan Note (Signed)
Well controlled today, medication refilled.

## 2012-01-05 ENCOUNTER — Encounter (HOSPITAL_COMMUNITY): Payer: Self-pay | Admitting: Emergency Medicine

## 2012-01-05 ENCOUNTER — Emergency Department (INDEPENDENT_AMBULATORY_CARE_PROVIDER_SITE_OTHER)
Admission: EM | Admit: 2012-01-05 | Discharge: 2012-01-05 | Disposition: A | Discharge status: Home / Self Care | Payer: Self-pay | Source: Home / Self Care | Attending: Emergency Medicine | Admitting: Emergency Medicine

## 2012-01-05 DIAGNOSIS — N760 Acute vaginitis: Secondary | ICD-10-CM

## 2012-01-05 LAB — POCT URINALYSIS DIP (DEVICE)
Glucose, UA: NEGATIVE mg/dL
Hgb urine dipstick: NEGATIVE
Nitrite: NEGATIVE
Protein, ur: NEGATIVE mg/dL
Specific Gravity, Urine: 1.02 (ref 1.005–1.030)
Urobilinogen, UA: 0.2 mg/dL (ref 0.0–1.0)

## 2012-01-05 LAB — WET PREP, GENITAL

## 2012-01-05 MED ORDER — METRONIDAZOLE 500 MG PO TABS: 500.0000 mg | ORAL_TABLET | Freq: Two times a day (BID) | ORAL | Status: DC

## 2012-01-05 NOTE — ED Notes (Signed)
Here with c/o vaginal irritation,burn,itch and yellow/brown d/c with odor that started x 10 dys ago.states she tried to treat otc with monistat but no relief, sx worsening with vag swelling.denies n/v/ or fevers.

## 2012-01-05 NOTE — ED Provider Notes (Signed)
Chief Complaint  Patient presents with  . Vaginal Discharge    History of Present Illness:   Carol Wilkins is a 56 year old female who has had a one-week history of vaginal discharge. The discharge is yellow and she describes itching, irritation, and odor. She has some swelling and redness externally. She has not had any external lesions such as ulcerations, blisters, or rash. She denies any pelvic pain but has had some lower back pain, although this has been chronic and she doesn't think it is related to the discharge. She denies any dysuria, frequency, urgency. She's had no fever, chills, nausea, or vomiting. She is status post hysterectomy and ovariectomy. She has a history of high blood pressure and is on amlodipine and lisinopril for that. She has an allergy to penicillin.  Review of Systems:  Other than noted above, the patient denies any of the following symptoms: Systemic:  No fever, chills, sweats, fatigue, or weight loss. GI:  No abdominal pain, nausea, anorexia, vomiting, diarrhea, constipation, melena or hematochezia. GU:  No dysuria, frequency, urgency, hematuria, vaginal discharge, itching, or abnormal vaginal bleeding. Skin:  No rash or itching.   PMFSH:  Past medical history, family history, social history, meds, and allergies were reviewed.  Physical Exam:   Vital signs:  BP 149/67  Pulse 66  Temp 99 F (37.2 C) (Oral)  Resp 18  SpO2 100% General:  Alert, oriented and in no distress. Lungs:  Breath sounds clear and equal bilaterally.  No wheezes, rales or rhonchi. Heart:  Regular rhythm.  No gallops or murmers. Abdomen:  Soft, flat and non-distended.  No organomegaly or mass.  No tenderness, guarding or rebound.  Bowel sounds normally active. Pelvic exam:  External exam is unremarkable. Vaginal mucosa is slightly atrophic. There is a scant amount of yellowish brown, frothy discharge which is somewhat malodorous. Cervix and uterus were surgically absent. Ovaries and tubes were  surgically absent. There is no tenderness in the pelvic area. Skin:  Clear, warm and dry.  Labs:   Results for orders placed during the hospital encounter of 01/05/12  POCT URINALYSIS DIP (DEVICE)      Component Value Range   Glucose, UA NEGATIVE  NEGATIVE mg/dL   Bilirubin Urine NEGATIVE  NEGATIVE   Ketones, ur NEGATIVE  NEGATIVE mg/dL   Specific Gravity, Urine 1.020  1.005 - 1.030   Hgb urine dipstick NEGATIVE  NEGATIVE   pH 6.0  5.0 - 8.0   Protein, ur NEGATIVE  NEGATIVE mg/dL   Urobilinogen, UA 0.2  0.0 - 1.0 mg/dL   Nitrite NEGATIVE  NEGATIVE   Leukocytes, UA LARGE (*) NEGATIVE     Assessment:  The encounter diagnosis was Vaginitis.  Differential diagnosis includes bacterial vaginosis, Trichomonas vaginitis, or atrophic vaginitis.  Plan:   1.  The following meds were prescribed:   New Prescriptions   METRONIDAZOLE (FLAGYL) 500 MG TABLET    Take 1 tablet (500 mg total) by mouth 2 (two) times daily.   2.  The patient was instructed in symptomatic care and handouts were given. 3.  The patient was told to return if becoming worse in any way, if no better in 3 or 4 days, and given some red flag symptoms that would indicate earlier return.    Reuben Likes, MD 01/05/12 [phone]

## 2012-01-06 LAB — GC/CHLAMYDIA PROBE AMP, GENITAL: Chlamydia, DNA Probe: NEGATIVE

## 2012-01-06 NOTE — ED Notes (Signed)
GC/Chlamydia neg., Wet prep: few clue cells, many WBC's. Pt. adequately treated with Flagyl. Vassie Moselle 01/06/2012

## 2012-01-10 ENCOUNTER — Emergency Department (HOSPITAL_COMMUNITY): Payer: Self-pay

## 2012-01-10 ENCOUNTER — Emergency Department (HOSPITAL_COMMUNITY)
Admission: EM | Admit: 2012-01-10 | Discharge: 2012-01-10 | Disposition: A | Discharge status: Home / Self Care | Payer: Self-pay | Attending: Emergency Medicine | Admitting: Emergency Medicine

## 2012-01-10 ENCOUNTER — Encounter (HOSPITAL_COMMUNITY): Payer: Self-pay

## 2012-01-10 DIAGNOSIS — R141 Gas pain: Secondary | ICD-10-CM | POA: Insufficient documentation

## 2012-01-10 DIAGNOSIS — R143 Flatulence: Secondary | ICD-10-CM | POA: Insufficient documentation

## 2012-01-10 DIAGNOSIS — K859 Acute pancreatitis without necrosis or infection, unspecified: Secondary | ICD-10-CM | POA: Insufficient documentation

## 2012-01-10 DIAGNOSIS — R110 Nausea: Secondary | ICD-10-CM | POA: Insufficient documentation

## 2012-01-10 DIAGNOSIS — R142 Eructation: Secondary | ICD-10-CM | POA: Insufficient documentation

## 2012-01-10 DIAGNOSIS — R1013 Epigastric pain: Secondary | ICD-10-CM | POA: Insufficient documentation

## 2012-01-10 DIAGNOSIS — R140 Abdominal distension (gaseous): Secondary | ICD-10-CM

## 2012-01-10 DIAGNOSIS — I10 Essential (primary) hypertension: Secondary | ICD-10-CM | POA: Insufficient documentation

## 2012-01-10 LAB — CBC WITH DIFFERENTIAL/PLATELET
HCT: 40 % (ref 36.0–46.0)
Hemoglobin: 13.1 g/dL (ref 12.0–15.0)
Lymphocytes Relative: 24 % (ref 12–46)
Lymphs Abs: 1.1 10*3/uL (ref 0.7–4.0)
MCHC: 32.8 g/dL (ref 30.0–36.0)
Monocytes Absolute: 0.3 10*3/uL (ref 0.1–1.0)
Monocytes Relative: 6 % (ref 3–12)
Neutro Abs: 3 10*3/uL (ref 1.7–7.7)
Neutrophils Relative %: 65 % (ref 43–77)
RBC: 4.81 MIL/uL (ref 3.87–5.11)

## 2012-01-10 LAB — URINALYSIS, ROUTINE W REFLEX MICROSCOPIC
Glucose, UA: NEGATIVE mg/dL
Ketones, ur: 15 mg/dL — AB
Specific Gravity, Urine: 1.024 (ref 1.005–1.030)
pH: 6.5 (ref 5.0–8.0)

## 2012-01-10 LAB — COMPREHENSIVE METABOLIC PANEL
AST: 25 U/L (ref 0–37)
Albumin: 4.1 g/dL (ref 3.5–5.2)
Alkaline Phosphatase: 75 U/L (ref 39–117)
Chloride: 102 mEq/L (ref 96–112)
Creatinine, Ser: 0.81 mg/dL (ref 0.50–1.10)
Potassium: 3.3 mEq/L — ABNORMAL LOW (ref 3.5–5.1)
Total Bilirubin: 0.3 mg/dL (ref 0.3–1.2)

## 2012-01-10 LAB — URINE MICROSCOPIC-ADD ON

## 2012-01-10 MED ORDER — ONDANSETRON HCL 4 MG PO TABS: 4.0000 mg | ORAL_TABLET | Freq: Four times a day (QID) | ORAL | Status: DC | PRN

## 2012-01-10 MED ORDER — OXYCODONE-ACETAMINOPHEN 5-325 MG PO TABS: 1.0000 | ORAL_TABLET | Freq: Four times a day (QID) | ORAL | Status: DC | PRN

## 2012-01-10 MED ORDER — HYDROMORPHONE HCL PF 1 MG/ML IJ SOLN
0.5000 mg | Freq: Once | INTRAMUSCULAR | Status: AC
  Administered 2012-01-10: 0.5 mg via INTRAVENOUS
  Filled 2012-01-10: qty 1

## 2012-01-10 MED ORDER — LACTATED RINGERS IV BOLUS (SEPSIS)
1000.0000 mL | Freq: Once | INTRAVENOUS | Status: AC
  Administered 2012-01-10: 1000 mL via INTRAVENOUS

## 2012-01-10 MED ORDER — IOHEXOL 300 MG/ML  SOLN
100.0000 mL | Freq: Once | INTRAMUSCULAR | Status: AC | PRN
  Administered 2012-01-10: 100 mL via INTRAVENOUS

## 2012-01-10 MED ORDER — ONDANSETRON HCL 4 MG/2ML IJ SOLN
4.0000 mg | Freq: Once | INTRAMUSCULAR | Status: AC
  Administered 2012-01-10: 4 mg via INTRAVENOUS
  Filled 2012-01-10: qty 2

## 2012-01-10 NOTE — ED Provider Notes (Signed)
History     CSN: [id number]  Arrival date & time 01/10/12  1478   First MD Initiated Contact with Patient 01/10/12 0848      Chief Complaint  Patient presents with  . Abdominal Pain    (Consider location/radiation/quality/duration/timing/severity/associated sxs/prior treatment) HPI Carol Wilkins is a 56 y.o. female presenting with bloating, upper abdominal pain, achy, 9/10, last meal last night soup which did not worsen it.  She had chills last night.  Pt has had GB out, still has appendix.  Pt was being treated with metronidazole for BV. Has 3 days left of therapy but those symptoms have resolved.  She denies SOB, Chest pain, exertional symptoms, diaphoreses.  She does have some mild nausea, no vomiting diarrhea, melena or hematochezia.   Complains about chronic left thumb MCP pain as well.    Past Medical History  Diagnosis Date  . Hypertension   . Anxiety attack     Past Surgical History  Procedure Date  . Cholecystectomy   . Tubal ligation   . Abdominal hysterectomy     History reviewed. No pertinent family history.  History  Substance Use Topics  . Smoking status: Former Smoker    Quit date: 09/13/1980  . Smokeless tobacco: Not on file  . Alcohol Use: No    OB History    Grav Para Term Preterm Abortions TAB SAB Ect Mult Living                  Review of SystemsAt least 10pt or greater review of systems completed and are negative except where specified in the HPI.   Allergies  Penicillins  Home Medications   Current Outpatient Rx  Name Route Sig Dispense Refill  . RISAQUAD PO CAPS Oral Take 1 capsule by mouth daily.    Marland Kitchen AMLODIPINE BESYLATE 5 MG PO TABS Oral Take 1 tablet (5 mg total) by mouth daily. 30 tablet 3  . ASPIRIN EC 81 MG PO TBEC Oral Take 81 mg by mouth daily.    Marland Kitchen LISINOPRIL-HYDROCHLOROTHIAZIDE 20-12.5 MG PO TABS Oral Take 2 tablets by mouth daily. 60 tablet 6  . METRONIDAZOLE 500 MG PO TABS Oral Take 1 tablet (500 mg total) by mouth 2  (two) times daily. 14 tablet 0  . ADULT MULTIVITAMIN W/MINERALS CH Oral Take 1 tablet by mouth daily.      BP 135/70  Pulse 65  Temp 98 F (36.7 C) (Oral)  Resp 16  SpO2 100%  Physical Exam  Nursing notes reviewed.  Electronic medical record reviewed. VITAL SIGNS:   Filed Vitals:   01/10/12 1119 01/10/12 1200 01/10/12 1300 01/10/12 1400  BP: 136/69 128/54 130/56 145/81  Pulse: 56 58 66 67  Temp:      TempSrc:      Resp: 16     Height: 5\' 3"  (1.6 m)     Weight: 170 lb (77.111 kg)     SpO2: 97% 98% 99% 100%   CONSTITUTIONAL: Awake, oriented, appears non-toxic HENT: Atraumatic, normocephalic, oral mucosa pink and moist, airway patent. Nares patent without drainage. External ears normal. EYES: Conjunctiva clear, EOMI, PERRLA NECK: Trachea midline, non-tender, supple CARDIOVASCULAR: Normal heart rate, Normal rhythm, No murmurs, rubs, gallops PULMONARY/CHEST: Clear to auscultation, no rhonchi, wheezes, or rales. Symmetrical breath sounds. Non-tender. ABDOMINAL: Non-distended, soft, mild TTP in the epigastrium, no rebound or guarding.  BS normal. NEUROLOGIC: Non-focal, moving all four extremities, no gross sensory or motor deficits. EXTREMITIES: No clubbing, cyanosis, or edema SKIN: Warm, Dry,  No erythema, No rash   ED Course  Procedures (including critical care time)  Labs Reviewed  COMPREHENSIVE METABOLIC PANEL - Abnormal; Notable for the following:    Potassium 3.3 (*)     Glucose, Bld 109 (*)     Total Protein 8.5 (*)     GFR calc non Af Amer 80 (*)     All other components within normal limits  LIPASE, BLOOD - Abnormal; Notable for the following:    Lipase 274 (*)     All other components within normal limits  URINALYSIS, ROUTINE W REFLEX MICROSCOPIC - Abnormal; Notable for the following:    Color, Urine AMBER (*)  BIOCHEMICALS MAY BE AFFECTED BY COLOR   Bilirubin Urine SMALL (*)     Ketones, ur 15 (*)     Leukocytes, UA MODERATE (*)     All other components  within normal limits  URINE MICROSCOPIC-ADD ON - Abnormal; Notable for the following:    Casts HYALINE CASTS (*)     All other components within normal limits  CBC WITH DIFFERENTIAL  LAB REPORT - SCANNED   Ct Abdomen Pelvis W Contrast  01/10/2012  *RADIOLOGY REPORT*  Clinical Data: Nausea.  Pancreatitis.  CT ABDOMEN AND PELVIS WITH CONTRAST  Technique:  Multidetector CT imaging of the abdomen and pelvis was performed following the standard protocol during bolus administration of intravenous contrast.  Contrast: OMNIPAQUE IOHEXOL 300 MG/ML  SOLN  Comparison: Abdominal pelvic CT 08/27/2009.  Findings: There is stable mild scarring or atelectasis at both lung bases.  No significant pleural effusion is seen.  There is stable mild prominence of the biliary system status post cholecystectomy.  The pancreas demonstrates no focal mass, surrounding inflammatory change or fluid collection.  There is no significant pancreatic ductal dilatation.  The spleen and adrenal glands appear normal.  There is an 8 mm cyst in the upper pole of the left kidney which has mildly enlarged.  No enhancing renal lesions are identified.  There is no hydronephrosis.  There is aorto iliac and proximal visceral artery atherosclerosis with prominent involvement of the superior mesenteric artery.  No large vessel occlusion is seen.  The portal, superior mesenteric and splenic veins are patent.  Stool is present throughout the colon.  There is no bowel wall thickening or evidence of bowel obstruction.  There is no adnexal mass or pelvic inflammatory process status post partial hysterectomy.  The bladder appears normal.  There is diffuse facet disease throughout the lumbar spine with a stable grade 1 anterolisthesis at L4-L5 and a convex right scoliosis.  IMPRESSION:  1.  No evidence of complicated pancreatitis or other acute process. 2.  No significant biliary dilatation status post cholecystectomy. 3.  Small left renal cyst, diffuse  atherosclerosis and lumbar spondylosis noted.   Original Report Authenticated By: Gerrianne Scale, M.D.    Dg Abd 2 Views  01/10/2012  *RADIOLOGY REPORT*  Clinical Data: Left upper abdominal pain with nausea.  History of cholecystectomy.  ABDOMEN - 2 VIEW  Comparison: abdominal pelvic CT 08/27/2009.  Findings: The bowel gas pattern is normal.  There is no free intraperitoneal air.  Stool is noted throughout the colon.  There are cholecystectomy clips.  No suspicious abdominal pelvic calcifications are seen.  There is a moderate convex right lumbar scoliosis which has progressed compared with the prior CT.  IMPRESSION: No acute abdominal findings identified status post CABG. Progressive lumbar scoliosis.   Original Report Authenticated By: Gerrianne Scale, M.D.  1. Pancreatitis   2. Epigastric pain   3. Bloating   4. Nausea       MDM  Carol Wilkins is a 56 y.o. female with presentation c/w pancreatitis, she is s/p cholecystectomy, may have choledocholithiasis, but she is in no distress, is non-toxic and afebrile.  Doubt cholangitis, perforated viscus.  Labs show mild elevation in lipase, no elevation in LFT c/w very mild pancreatitis.  CT shows no stranding.  Possible that metronidazole caused pancreatitis.  UA is heavily pigmented - LE was +, but minimal WBC and pt is asymptomatic.  Will not treat urine.  DC metronidazole.  Pt will be DC to home with conservative pain management therapy, anti-emetics and extensive counseling on a liquid diet with slow advancement to solids over the next few days.  PT rehydrated in eD with 2L LR.  I explained the diagnosis and have given explicit precautions to return to the ER including inability to keep down fluids d/t N/V or any other new or worsening symptoms. The patient understands and accepts the medical plan as it's been dictated and I have answered their questions. Discharge instructions concerning home care and prescriptions have been given.   The patient is STABLE and is discharged to home in good condition.         Jones Skene, MD 01/11/12 [phone]

## 2012-01-10 NOTE — ED Notes (Signed)
Pt with c/o left sided abdominal pain and bloated feeling

## 2012-01-10 NOTE — ED Notes (Signed)
Patient transported to X-ray 

## 2012-01-25 ENCOUNTER — Ambulatory Visit: Payer: Self-pay | Admitting: Family Medicine

## 2012-01-25 ENCOUNTER — Encounter: Payer: Self-pay | Admitting: Family Medicine

## 2012-01-25 ENCOUNTER — Ambulatory Visit (INDEPENDENT_AMBULATORY_CARE_PROVIDER_SITE_OTHER): Payer: Self-pay | Admitting: Family Medicine

## 2012-01-25 VITALS — BP 131/83 | HR 60 | Temp 97.8°F | Ht 63.0 in | Wt 172.0 lb

## 2012-01-25 DIAGNOSIS — M545 Low back pain, unspecified: Secondary | ICD-10-CM

## 2012-01-25 DIAGNOSIS — R109 Unspecified abdominal pain: Secondary | ICD-10-CM

## 2012-01-25 DIAGNOSIS — I10 Essential (primary) hypertension: Secondary | ICD-10-CM

## 2012-01-25 MED ORDER — LISINOPRIL-HYDROCHLOROTHIAZIDE 20-12.5 MG PO TABS: 2.0000 | ORAL_TABLET | Freq: Every day | ORAL | Status: DC

## 2012-01-25 MED ORDER — AMLODIPINE BESYLATE 5 MG PO TABS: 5.0000 mg | ORAL_TABLET | Freq: Every day | ORAL | Status: DC

## 2012-01-25 NOTE — Patient Instructions (Addendum)
It was nice to meet you.  I'm glad you're feeling better!  Get your flu shot from a pharmacy such as [facility] or [facility].   For your back pain, you can take over the counter acetaminophen (tylenol) extended release 650mg  3 times a day.  In a few weeks, you can start taking ibuprofen 600mg  every 8 hours as needed.    Come back and see Dr. Ashley Royalty in a few months.

## 2012-01-25 NOTE — Assessment & Plan Note (Signed)
Pancreatitis appears to have resolved. Red flags such as nausea, vomiting, epigastric pain discussed.

## 2012-01-25 NOTE — Assessment & Plan Note (Signed)
Likely MSK, try tylenol 1st given recent pancreatitis, then add ibuprofen PRN. No red flags, pt w/o insurance and no real need for imaging so will defer films at this time.

## 2012-01-25 NOTE — Progress Notes (Signed)
S: Pt comes in today for follow up. Pt was seen in ED on 01/10/12 for mild pancreatitis. No nausea or vomiting.  No abdominal pain.  No diarrhea.  Is feeling like she gets full a little faster than previously.  No pain with urination.  No vaginal discharge.  Some mild low back pain.  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   ROS: Per HPI  History  Smoking status  . Former Smoker  . Quit date: 09/13/1980  Smokeless tobacco  . Not on file    O:  Filed Vitals:   01/25/12 1629  BP: 131/83  Pulse: 60  Temp: 97.8 F (36.6 C)    Gen: NAD CV: RRR, no murmur Pulm: CTA bilat, no wheezes or crackles Abd: soft, NT, +BS Ext: Warm, no edema   A/P: 56 y.o. female p/w resolved pancreatitis, likely MSK back pain -See problem list -f/u in 2 months

## 2012-03-01 ENCOUNTER — Encounter: Payer: Self-pay | Admitting: Vascular Surgery

## 2012-05-01 ENCOUNTER — Encounter (HOSPITAL_COMMUNITY): Payer: Self-pay | Admitting: Emergency Medicine

## 2012-05-01 ENCOUNTER — Emergency Department (INDEPENDENT_AMBULATORY_CARE_PROVIDER_SITE_OTHER): Payer: BC Managed Care – PPO

## 2012-05-01 ENCOUNTER — Emergency Department (HOSPITAL_COMMUNITY)
Admission: EM | Admit: 2012-05-01 | Discharge: 2012-05-01 | Disposition: A | Discharge status: Home / Self Care | Payer: BC Managed Care – PPO | Source: Home / Self Care | Attending: Family Medicine | Admitting: Family Medicine

## 2012-05-01 DIAGNOSIS — J111 Influenza due to unidentified influenza virus with other respiratory manifestations: Secondary | ICD-10-CM

## 2012-05-01 MED ORDER — HYDROCOD POLST-CHLORPHEN POLST 10-8 MG/5ML PO LQCR: 5.0000 mL | Freq: Two times a day (BID) | ORAL | Status: DC | PRN

## 2012-05-01 NOTE — ED Notes (Signed)
Pt c/o cold/flu like sx x6 days.  Sx include: chills, back pain, left ear pain, dry cough, runny nose, nasal congestion Denies: fevers, vomiting, nauseas, diarrhea  She is alert w/no signs of acute distress.

## 2012-05-01 NOTE — ED Provider Notes (Signed)
History     CSN: [id number]  Arrival date & time 05/01/12  1600   First MD Initiated Contact with Patient 05/01/12 1644      Chief Complaint  Patient presents with  . URI    (Consider location/radiation/quality/duration/timing/severity/associated sxs/prior treatment) Patient is a 57 y.o. female presenting with cough. The history is provided by the patient.  Cough This is a new problem. The current episode started more than 1 week ago. The problem has been gradually worsening. The cough is non-productive. The maximum temperature recorded prior to her arrival was 100 to 100.9 F. Associated symptoms include chills, ear congestion, ear pain, rhinorrhea and myalgias. Pertinent negatives include no shortness of breath. Risk factors: around sick people at work., did not get flu shot this season. She is not a smoker.    Past Medical History  Diagnosis Date  . Hypertension   . Anxiety attack     Past Surgical History  Procedure Date  . Cholecystectomy   . Tubal ligation   . Abdominal hysterectomy     No family history on file.  History  Substance Use Topics  . Smoking status: Former Smoker    Quit date: 09/13/1980  . Smokeless tobacco: Not on file  . Alcohol Use: No    OB History    Grav Para Term Preterm Abortions TAB SAB Ect Mult Living                  Review of Systems  Constitutional: Positive for fever and chills.  HENT: Positive for ear pain, congestion and rhinorrhea.   Respiratory: Positive for cough. Negative for shortness of breath.   Gastrointestinal: Negative.   Genitourinary: Negative.   Musculoskeletal: Positive for myalgias.    Allergies  Penicillins  Home Medications   Current Outpatient Rx  Name  Route  Sig  Dispense  Refill  . AMLODIPINE BESYLATE 5 MG PO TABS   Oral   Take 1 tablet (5 mg total) by mouth daily.   30 tablet   5   . LISINOPRIL-HYDROCHLOROTHIAZIDE 20-12.5 MG PO TABS   Oral   Take 2 tablets by mouth daily.   60 tablet  6   . RISAQUAD PO CAPS   Oral   Take 1 capsule by mouth daily.         . ASPIRIN EC 81 MG PO TBEC   Oral   Take 81 mg by mouth daily.         Marland Kitchen HYDROCOD POLST-CPM POLST ER 10-8 MG/5ML PO LQCR   Oral   Take 5 mLs by mouth every 12 (twelve) hours as needed. cough   115 mL   0   . METRONIDAZOLE 500 MG PO TABS   Oral   Take 1 tablet (500 mg total) by mouth 2 (two) times daily.   14 tablet   0   . ADULT MULTIVITAMIN W/MINERALS CH   Oral   Take 1 tablet by mouth daily.         Marland Kitchen ONDANSETRON HCL 4 MG PO TABS   Oral   Take 1 tablet (4 mg total) by mouth every 6 (six) hours as needed for nausea.   12 tablet   0   . OXYCODONE-ACETAMINOPHEN 5-325 MG PO TABS   Oral   Take 1-2 tablets by mouth every 6 (six) hours as needed for pain.   6 tablet   0     BP 142/75  Pulse 60  Temp 98.2 F (36.8 C) (Oral)  Resp 16  SpO2 100%  Physical Exam  Nursing note and vitals reviewed. Constitutional: She is oriented to person, place, and time. She appears well-developed and well-nourished.  HENT:  Head: Normocephalic.  Right Ear: External ear normal.  Left Ear: External ear normal.  Mouth/Throat: Oropharynx is clear and moist.  Eyes: Conjunctivae normal are normal. Pupils are equal, round, and reactive to light.  Neck: Normal range of motion. Neck supple.  Cardiovascular: Normal rate, regular rhythm, normal heart sounds and intact distal pulses.   Pulmonary/Chest: Effort normal and breath sounds normal.  Abdominal: Soft. Bowel sounds are normal.  Lymphadenopathy:    She has no cervical adenopathy.  Neurological: She is alert and oriented to person, place, and time.  Skin: Skin is warm and dry.    ED Course  Procedures (including critical care time)  Labs Reviewed - No data to display Dg Chest 2 View  05/01/2012  *RADIOLOGY REPORT*  Clinical Data: Cough and fever.  CHEST - 2 VIEW  Comparison: 08/27/2009  Findings: Heart size upper limits normal.  Vascular clips in the  upper abdomen.  Lungs clear.  No effusion.  Regional bones unremarkable.  IMPRESSION:  1.  No acute disease   Original Report Authenticated By: D. Andria Rhein, MD      1. Influenza-like illness       MDM  X-rays reviewed and report per radiologist.         Linna Hoff, MD 05/04/12 [phone]

## 2012-06-05 ENCOUNTER — Other Ambulatory Visit (HOSPITAL_COMMUNITY): Payer: Self-pay | Admitting: Family Medicine

## 2012-06-05 DIAGNOSIS — Z1231 Encounter for screening mammogram for malignant neoplasm of breast: Secondary | ICD-10-CM

## 2012-07-03 ENCOUNTER — Ambulatory Visit (HOSPITAL_COMMUNITY): Payer: BC Managed Care – PPO

## 2012-07-12 ENCOUNTER — Ambulatory Visit (HOSPITAL_COMMUNITY)
Admission: RE | Admit: 2012-07-12 | Discharge: 2012-07-12 | Disposition: A | Discharge status: Home / Self Care | Payer: BC Managed Care – PPO | Source: Ambulatory Visit | Attending: Family Medicine | Admitting: Family Medicine

## 2012-07-12 DIAGNOSIS — Z1231 Encounter for screening mammogram for malignant neoplasm of breast: Secondary | ICD-10-CM | POA: Insufficient documentation

## 2013-01-01 ENCOUNTER — Emergency Department (HOSPITAL_COMMUNITY)
Admission: EM | Admit: 2013-01-01 | Discharge: 2013-01-02 | Disposition: A | Discharge status: Home / Self Care | Payer: BC Managed Care – PPO | Attending: Emergency Medicine | Admitting: Emergency Medicine

## 2013-01-01 ENCOUNTER — Emergency Department (HOSPITAL_COMMUNITY): Payer: BC Managed Care – PPO

## 2013-01-01 ENCOUNTER — Encounter (HOSPITAL_COMMUNITY): Payer: Self-pay | Admitting: Emergency Medicine

## 2013-01-01 ENCOUNTER — Other Ambulatory Visit: Payer: Self-pay

## 2013-01-01 DIAGNOSIS — R51 Headache: Secondary | ICD-10-CM | POA: Insufficient documentation

## 2013-01-01 DIAGNOSIS — Z79899 Other long term (current) drug therapy: Secondary | ICD-10-CM | POA: Insufficient documentation

## 2013-01-01 DIAGNOSIS — Z87891 Personal history of nicotine dependence: Secondary | ICD-10-CM | POA: Insufficient documentation

## 2013-01-01 DIAGNOSIS — Z8659 Personal history of other mental and behavioral disorders: Secondary | ICD-10-CM | POA: Insufficient documentation

## 2013-01-01 DIAGNOSIS — R519 Headache, unspecified: Secondary | ICD-10-CM

## 2013-01-01 DIAGNOSIS — I10 Essential (primary) hypertension: Secondary | ICD-10-CM | POA: Insufficient documentation

## 2013-01-01 LAB — CBC WITH DIFFERENTIAL/PLATELET
Basophils Absolute: 0 10*3/uL (ref 0.0–0.1)
Basophils Relative: 0 % (ref 0–1)
Eosinophils Absolute: 0.2 10*3/uL (ref 0.0–0.7)
Eosinophils Relative: 4 % (ref 0–5)
Lymphs Abs: 1.6 10*3/uL (ref 0.7–4.0)
MCH: 26.4 pg (ref 26.0–34.0)
MCHC: 31.4 g/dL (ref 30.0–36.0)
Neutrophils Relative %: 51 % (ref 43–77)
Platelets: 216 10*3/uL (ref 150–400)
RBC: 4.58 MIL/uL (ref 3.87–5.11)
RDW: 15 % (ref 11.5–15.5)

## 2013-01-01 LAB — POCT I-STAT TROPONIN I: Troponin i, poc: 0.02 ng/mL (ref 0.00–0.08)

## 2013-01-01 LAB — BASIC METABOLIC PANEL
Calcium: 10.1 mg/dL (ref 8.4–10.5)
GFR calc Af Amer: >90 mL/min (ref >90)
GFR calc non Af Amer: 80 mL/min — ABNORMAL LOW (ref >90)
Potassium: 3.5 mEq/L (ref 3.5–5.1)
Sodium: 139 mEq/L (ref 135–145)

## 2013-01-01 MED ORDER — KETOROLAC TROMETHAMINE 30 MG/ML IJ SOLN
30.0000 mg | Freq: Once | INTRAMUSCULAR | Status: AC
  Administered 2013-01-01: 30 mg via INTRAVENOUS
  Filled 2013-01-01: qty 1

## 2013-01-01 MED ORDER — DIPHENHYDRAMINE HCL 50 MG/ML IJ SOLN
25.0000 mg | Freq: Once | INTRAMUSCULAR | Status: AC
  Administered 2013-01-01: 25 mg via INTRAVENOUS
  Filled 2013-01-01: qty 1

## 2013-01-01 MED ORDER — METOCLOPRAMIDE HCL 5 MG/ML IJ SOLN
10.0000 mg | Freq: Once | INTRAMUSCULAR | Status: AC
  Administered 2013-01-01: 10 mg via INTRAVENOUS
  Filled 2013-01-01: qty 2

## 2013-01-01 MED ORDER — SODIUM CHLORIDE 0.9 % IV BOLUS (SEPSIS)
1000.0000 mL | Freq: Once | INTRAVENOUS | Status: AC
Start: 2013-01-01 — End: 2013-01-02
  Administered 2013-01-01: 1000 mL via INTRAVENOUS

## 2013-01-01 NOTE — ED Notes (Signed)
Pt c/o headaches and "equilibrium being off" x 2 wks.  States that a few years ago, she was told to come back and get a "vein in her neck checked because she wasn't getting enough oxygen".  States that she doesn't know whether that has something to do with it or not.  Hx of HTN.

## 2013-01-01 NOTE — ED Provider Notes (Signed)
CSN: [id number]     Arrival date & time 01/01/13  7829 History   First MD Initiated Contact with Patient 01/01/13 2131     Chief Complaint  Patient presents with  . Headache   (Consider location/radiation/quality/duration/timing/severity/associated sxs/prior Treatment) HPI Pt presents with c/o headache over the past 2 weeks.  She states headache began gradually and has been constant in nature. Head is hurting over her forehead and behind left eye.  She also states that she occasionally feels a pain in her left ear.  No changes in vision or speech.  No focal weakness of arms or legs.  No vomiting.  She states she has taken ibuprofen occasionally for headache but it did not give much relief.  No trauma.  No fever, no neck stiffness.  There are no other associated systemic symptoms, there are no other alleviating or modifying factors.  Pt also c/o burning in epigastric region which has been constant over the past 3 days.  No sob, no nausea associated.  No radiation of pain.    Past Medical History  Diagnosis Date  . Hypertension   . Anxiety attack    Past Surgical History  Procedure Laterality Date  . Cholecystectomy    . Tubal ligation    . Abdominal hysterectomy     No family history on file. History  Substance Use Topics  . Smoking status: Former Smoker    Quit date: 09/13/1980  . Smokeless tobacco: Not on file  . Alcohol Use: No   OB History   Grav Para Term Preterm Abortions TAB SAB Ect Mult Living                 Review of Systems ROS reviewed and all otherwise negative except for mentioned in HPI  Allergies  Penicillins  Home Medications   Current Outpatient Rx  Name  Route  Sig  Dispense  Refill  . amLODipine (NORVASC) 5 MG tablet   Oral   Take 1 tablet (5 mg total) by mouth daily.   30 tablet   5   . lisinopril-hydrochlorothiazide (ZESTORETIC) 20-12.5 MG per tablet   Oral   Take 2 tablets by mouth daily.   60 tablet   6    BP 157/76  Pulse 63   Temp(Src) 97.5 F (36.4 C) (Oral)  Resp 18  SpO2 100% Vitals reviewed Physical Exam Physical Examination: General appearance - alert, well appearing, and in no distress Mental status - alert, oriented to person, place, and time Eyes - pupils equal and reactive, EOMI Mouth - mucous membranes moist, pharynx normal without lesions Chest - clear to auscultation, no wheezes, rales or rhonchi, symmetric air entry Heart - normal rate, regular rhythm, normal S1, S2, no murmurs, rubs, clicks or gallops Abdomen - soft, nontender, nondistended, no masses or organomegaly Neurological - alert, oriented, cranial nerves 2-12 tested and intact, strength 5/5 in extremities, sensation intact Extremities - peripheral pulses normal, no pedal edema, no clubbing or cyanosis Skin - normal coloration and turgor, no rashes  ED Course  Procedures (including critical care time) Labs Review Labs Reviewed  BASIC METABOLIC PANEL - Abnormal; Notable for the following:    Glucose, Bld 107 (*)    GFR calc non Af Amer 80 (*)    All other components within normal limits  CBC WITH DIFFERENTIAL  TROPONIN I  POCT I-STAT TROPONIN I   Imaging Review Ct Head Wo Contrast  01/01/2013   *RADIOLOGY REPORT*  Clinical Data: Disequilibrium.  CT HEAD  WITHOUT CONTRAST  Technique:  Contiguous axial images were obtained from the base of the skull through the vertex without contrast.  Comparison: CT of head September 18, 2011.  Findings: Ventricles and sulci are normal for patient's age.  No intraparenchymal hemorrhage, focal abnormal hypodensities, mass effect or midline shift. No acute large vascular territory infarcts.  No abnormal extra-axial fluid collections.  Basal cisterns are patent.  Fluid density within the sella, unchanged.  Mild to moderate calcific atherosclerosis of the carotid siphons.  Punctate calcifications within the included parotid glands may reflect sialolith.  Soft tissue opacifies the left mastoid tip, similar.  No  paranasal sinus air-fluid levels.  Included ocular globes and orbital contents are not suspicious but not tailored for evaluation.  Moderate temporomandibular osteoarthrosis.  No skull fracture.  IMPRESSION: No acute intracranial process; stable from September 18, 2011.   Original Report Authenticated By: Awilda Metro    MDM   1. Headache    Pt presnting with c/o headache over the past 2 weeks.  She has normal neurologic exam, CT head is reassuring.  She also c/o burning in her chest which feels like heartburn and has been constantly present over the past 3 days.  EKG and troponin are reassuring.  Pt received migraine cocktail for headache and will also give GI cocktail.      Ethelda Chick, MD 01/02/13 [id number]

## 2013-01-02 MED ORDER — GI COCKTAIL ~~LOC~~
30.0000 mL | Freq: Once | ORAL | Status: AC
  Administered 2013-01-02: 30 mL via ORAL
  Filled 2013-01-02: qty 30

## 2013-01-22 ENCOUNTER — Other Ambulatory Visit: Payer: Self-pay | Admitting: Family Medicine

## 2013-01-22 DIAGNOSIS — R0989 Other specified symptoms and signs involving the circulatory and respiratory systems: Secondary | ICD-10-CM

## 2013-01-25 ENCOUNTER — Ambulatory Visit
Admission: RE | Admit: 2013-01-25 | Discharge: 2013-01-25 | Disposition: A | Discharge status: Home / Self Care | Payer: BC Managed Care – PPO | Source: Ambulatory Visit | Attending: Family Medicine | Admitting: Family Medicine

## 2013-01-25 DIAGNOSIS — R0989 Other specified symptoms and signs involving the circulatory and respiratory systems: Secondary | ICD-10-CM

## 2013-03-03 ENCOUNTER — Emergency Department (HOSPITAL_COMMUNITY)
Admission: EM | Admit: 2013-03-03 | Discharge: 2013-03-03 | Disposition: A | Discharge status: Home / Self Care | Payer: BC Managed Care – PPO | Source: Home / Self Care

## 2013-03-03 ENCOUNTER — Encounter (HOSPITAL_COMMUNITY): Payer: Self-pay | Admitting: Emergency Medicine

## 2013-03-03 DIAGNOSIS — R0982 Postnasal drip: Secondary | ICD-10-CM

## 2013-03-03 DIAGNOSIS — R059 Cough, unspecified: Secondary | ICD-10-CM

## 2013-03-03 DIAGNOSIS — R05 Cough: Secondary | ICD-10-CM

## 2013-03-03 DIAGNOSIS — J988 Other specified respiratory disorders: Secondary | ICD-10-CM

## 2013-03-03 MED ORDER — AZITHROMYCIN 250 MG PO TABS: 250.0000 mg | ORAL_TABLET | Freq: Every day | ORAL | Status: DC

## 2013-03-03 MED ORDER — BENZONATATE 100 MG PO CAPS: ORAL_CAPSULE | ORAL | Status: DC

## 2013-03-03 MED ORDER — HYDROCODONE-HOMATROPINE 5-1.5 MG/5ML PO SYRP: 5.0000 mL | ORAL_SOLUTION | Freq: Four times a day (QID) | ORAL | Status: DC | PRN

## 2013-03-03 NOTE — ED Provider Notes (Signed)
CSN: [id number]     Arrival date & time 03/03/13  1013 History   None    Chief Complaint  Patient presents with  . URI   (Consider location/radiation/quality/duration/timing/severity/associated sxs/prior Treatment) HPI Comments: Carol Wilkins is a very pleasant 57 yo black female who presents with dry cough, fatigue and malaise x 1.5 weeks. Using OTC medication without relief. She denies fever or chills. No upper nasal symptoms, though she has mild post nasal drip. No history of allergies or asthma and otherwise healthy.   Patient is a 57 y.o. female presenting with URI. The history is provided by the patient.  URI   Past Medical History  Diagnosis Date  . Hypertension   . Anxiety attack    Past Surgical History  Procedure Laterality Date  . Cholecystectomy    . Tubal ligation    . Abdominal hysterectomy     History reviewed. No pertinent family history. History  Substance Use Topics  . Smoking status: Former Smoker    Quit date: 09/13/1980  . Smokeless tobacco: Not on file  . Alcohol Use: No   OB History   Grav Para Term Preterm Abortions TAB SAB Ect Mult Living                 Review of Systems  All other systems reviewed and are negative.    Allergies  Penicillins  Home Medications   Current Outpatient Rx  Name  Route  Sig  Dispense  Refill  . EXPIRED: amLODipine (NORVASC) 5 MG tablet   Oral   Take 1 tablet (5 mg total) by mouth daily.   30 tablet   5   . azithromycin (ZITHROMAX) 250 MG tablet   Oral   Take 1 tablet (250 mg total) by mouth daily. Take first 2 tablets together, then 1 every day until finished.   6 tablet   0   . benzonatate (TESSALON) 100 MG capsule      Take 1 tablet every 12 hours as needed for cough   21 capsule   0   . HYDROcodone-homatropine (HYCODAN) 5-1.5 MG/5ML syrup   Oral   Take 5 mLs by mouth every 6 (six) hours as needed for cough.   120 mL   0   . EXPIRED: lisinopril-hydrochlorothiazide (ZESTORETIC) 20-12.5 MG  per tablet   Oral   Take 2 tablets by mouth daily.   60 tablet   6    BP 114/72  Pulse 62  Temp(Src) 98.6 F (37 C) (Oral)  Resp 16  SpO2 100% Physical Exam  Nursing note and vitals reviewed. Constitutional: She is oriented to person, place, and time. She appears well-developed and well-nourished. No distress.  HENT:  Head: Normocephalic and atraumatic.  Right Ear: External ear normal.  Left Ear: External ear normal.  Nose: Nose normal.  Mouth/Throat: Oropharynx is clear and moist.  Eyes: Pupils are equal, round, and reactive to light.  Neck: Normal range of motion. Neck supple. No tracheal deviation present.  Cardiovascular: Normal rate and regular rhythm.   No murmur heard. Pulmonary/Chest: Effort normal. No respiratory distress. She has no wheezes. She has no rales.  Mild course breath sounds in the right lower base, few mild crackles clear with cough, no wheeze  Lymphadenopathy:    She has no cervical adenopathy.  Neurological: She is alert and oriented to person, place, and time. No cranial nerve deficit.  Skin: Skin is warm and dry. She is not diaphoretic.  Psychiatric: Her behavior is normal.  ED Course  Procedures (including critical care time) Labs Review Labs Reviewed - No data to display Imaging Review No results found.  EKG Interpretation     Ventricular Rate:    PR Interval:    QRS Duration:   QT Interval:    QTC Calculation:   R Axis:     Text Interpretation:              MDM   1. Respiratory infection   2. Cough   3. PND (post-nasal drip)    Given duration cover with ABX and suppurative care with cough syrup and Tessalon Pereles.     Riki Sheer, PA-C 03/03/13 1158

## 2013-03-03 NOTE — ED Notes (Signed)
Head cold/congestion.  Noted sinus drainage, congestion , chills, watery eyes, denies fever.  Throat sore, frequent coughing.  Coughing up thick, grey phlegm this am

## 2013-03-04 NOTE — ED Provider Notes (Signed)
Medical screening examination/treatment/procedure(s) were performed by resident physician or non-physician practitioner and as supervising physician I was immediately available for consultation/collaboration.   Eliga Arvie DOUGLAS MD.   Laverle Pillard D Shyia Fillingim, MD 03/04/13 2059 

## 2013-03-06 ENCOUNTER — Emergency Department (HOSPITAL_COMMUNITY)
Admission: EM | Admit: 2013-03-06 | Discharge: 2013-03-06 | Disposition: A | Discharge status: Home / Self Care | Payer: BC Managed Care – PPO | Attending: Emergency Medicine | Admitting: Emergency Medicine

## 2013-03-06 ENCOUNTER — Encounter (HOSPITAL_COMMUNITY): Payer: Self-pay | Admitting: Emergency Medicine

## 2013-03-06 ENCOUNTER — Emergency Department (HOSPITAL_COMMUNITY): Payer: BC Managed Care – PPO

## 2013-03-06 DIAGNOSIS — Z792 Long term (current) use of antibiotics: Secondary | ICD-10-CM | POA: Insufficient documentation

## 2013-03-06 DIAGNOSIS — Z7982 Long term (current) use of aspirin: Secondary | ICD-10-CM | POA: Insufficient documentation

## 2013-03-06 DIAGNOSIS — R059 Cough, unspecified: Secondary | ICD-10-CM

## 2013-03-06 DIAGNOSIS — Z880 Allergy status to penicillin: Secondary | ICD-10-CM | POA: Insufficient documentation

## 2013-03-06 DIAGNOSIS — Z79899 Other long term (current) drug therapy: Secondary | ICD-10-CM | POA: Insufficient documentation

## 2013-03-06 DIAGNOSIS — R05 Cough: Secondary | ICD-10-CM

## 2013-03-06 DIAGNOSIS — R109 Unspecified abdominal pain: Secondary | ICD-10-CM | POA: Insufficient documentation

## 2013-03-06 DIAGNOSIS — Z87891 Personal history of nicotine dependence: Secondary | ICD-10-CM | POA: Insufficient documentation

## 2013-03-06 DIAGNOSIS — Z8659 Personal history of other mental and behavioral disorders: Secondary | ICD-10-CM | POA: Insufficient documentation

## 2013-03-06 DIAGNOSIS — I10 Essential (primary) hypertension: Secondary | ICD-10-CM | POA: Insufficient documentation

## 2013-03-06 DIAGNOSIS — M940 Chondrocostal junction syndrome [Tietze]: Secondary | ICD-10-CM | POA: Insufficient documentation

## 2013-03-06 DIAGNOSIS — J069 Acute upper respiratory infection, unspecified: Secondary | ICD-10-CM | POA: Insufficient documentation

## 2013-03-06 LAB — CBC
HCT: 35.9 % — ABNORMAL LOW (ref 36.0–46.0)
Hemoglobin: 11.8 g/dL — ABNORMAL LOW (ref 12.0–15.0)
MCH: 27.6 pg (ref 26.0–34.0)
MCHC: 32.9 g/dL (ref 30.0–36.0)
MCV: 84.1 fL (ref 78.0–100.0)
RBC: 4.27 MIL/uL (ref 3.87–5.11)

## 2013-03-06 LAB — COMPREHENSIVE METABOLIC PANEL
ALT: 14 U/L (ref 0–35)
Alkaline Phosphatase: 66 U/L (ref 39–117)
BUN: 17 mg/dL (ref 6–23)
CO2: 29 mEq/L (ref 19–32)
Calcium: 9.7 mg/dL (ref 8.4–10.5)
GFR calc Af Amer: >90 mL/min (ref >90)
GFR calc non Af Amer: 79 mL/min — ABNORMAL LOW (ref >90)
Glucose, Bld: 82 mg/dL (ref 70–99)
Potassium: 3.3 mEq/L — ABNORMAL LOW (ref 3.5–5.1)
Total Protein: 8.1 g/dL (ref 6.0–8.3)

## 2013-03-06 LAB — LIPASE, BLOOD: Lipase: 34 U/L (ref 11–59)

## 2013-03-06 MED ORDER — HYDROCODONE-ACETAMINOPHEN 5-325 MG PO TABS: 1.0000 | ORAL_TABLET | Freq: Four times a day (QID) | ORAL | Status: DC | PRN

## 2013-03-06 MED ORDER — IBUPROFEN 800 MG PO TABS: 800.0000 mg | ORAL_TABLET | Freq: Three times a day (TID) | ORAL | Status: DC

## 2013-03-06 MED ORDER — POTASSIUM CHLORIDE CRYS ER 20 MEQ PO TBCR
40.0000 meq | EXTENDED_RELEASE_TABLET | Freq: Once | ORAL | Status: AC
  Administered 2013-03-06: 40 meq via ORAL
  Filled 2013-03-06: qty 2

## 2013-03-06 NOTE — ED Provider Notes (Signed)
CSN: [id number]     Arrival date & time 03/06/13  1754 History   First MD Initiated Contact with Patient 03/06/13 1956     Chief Complaint  Patient presents with  . Dizziness  . Abdominal Pain   (Consider location/radiation/quality/duration/timing/severity/associated sxs/prior Treatment) HPI Comments: The patient is a 57 year old female past medical history significant for hypertension, anxiety presented to the emergency department for 1-1/2 weeks of continued productive cough, nasal congestion, rhinorrhea. Patient states she was in urgent care Center on Sunday and diagnosed with an upper respiratory infection and given Tussinex and a Z-Pak which is only mildly alleviated her symptoms. The patient states that she has developed posttussive left lower rib pain. She states her pain is worsened with coughing and deep inspiration. She also states she has been a little lightheaded but has not had any syncope or loss of consciousness. She does note decreased by mouth intake over the last few days due to her illness. Patient denies any chest pain, nausea, vomiting, diarrhea.     The history is provided by the patient.    Past Medical History  Diagnosis Date  . Hypertension   . Anxiety attack    Past Surgical History  Procedure Laterality Date  . Cholecystectomy    . Tubal ligation    . Abdominal hysterectomy     No family history on file. History  Substance Use Topics  . Smoking status: Former Smoker    Quit date: 09/13/1980  . Smokeless tobacco: Not on file  . Alcohol Use: No   OB History   Grav Para Term Preterm Abortions TAB SAB Ect Mult Living                 Review of Systems  Constitutional: Negative for fever.  HENT: Positive for congestion, postnasal drip and rhinorrhea.   Respiratory: Positive for cough and chest tightness.   Cardiovascular: Negative for chest pain.    Allergies  Penicillins  Home Medications   Current Outpatient Rx  Name  Route  Sig  Dispense   Refill  . amLODipine (NORVASC) 5 MG tablet   Oral   Take 1 tablet (5 mg total) by mouth daily.   30 tablet   5   . aspirin 325 MG EC tablet   Oral   Take 325 mg by mouth daily.         Marland Kitchen azithromycin (ZITHROMAX) 250 MG tablet   Oral   Take 1 tablet (250 mg total) by mouth daily. Take first 2 tablets together, then 1 every day until finished.   6 tablet   0   . benzonatate (TESSALON) 100 MG capsule      Take 1 tablet every 12 hours as needed for cough   21 capsule   0   . guaifenesin (ROBITUSSIN) 100 MG/5ML syrup   Oral   Take 30 mLs by mouth 3 (three) times daily as needed for cough.         Marland Kitchen HYDROcodone-homatropine (HYCODAN) 5-1.5 MG/5ML syrup   Oral   Take 5 mLs by mouth every 6 (six) hours as needed for cough.   120 mL   0   . lisinopril-hydrochlorothiazide (ZESTORETIC) 20-12.5 MG per tablet   Oral   Take 2 tablets by mouth daily.   60 tablet   6   . HYDROcodone-acetaminophen (NORCO/VICODIN) 5-325 MG per tablet   Oral   Take 1-2 tablets by mouth every 6 (six) hours as needed for severe pain.   12  tablet   0   . ibuprofen (ADVIL,MOTRIN) 800 MG tablet   Oral   Take 1 tablet (800 mg total) by mouth 3 (three) times daily.   21 tablet   0    BP 148/74  Pulse 72  Temp(Src) 98.3 F (36.8 C) (Oral)  Resp 16  Ht 5\' 3"  (1.6 m)  Wt 172 lb (78.019 kg)  BMI 30.48 kg/m2  SpO2 100% Physical Exam  Constitutional: She is oriented to person, place, and time. She appears well-developed and well-nourished. No distress.  HENT:  Head: Normocephalic and atraumatic.  Right Ear: External ear normal.  Left Ear: External ear normal.  Nose: Nose normal.  Mouth/Throat: Oropharynx is clear and moist. No oropharyngeal exudate.  Eyes: Conjunctivae and EOM are normal. Pupils are equal, round, and reactive to light.  Neck: Normal range of motion. Neck supple.  Cardiovascular: Normal rate, regular rhythm, normal heart sounds and intact distal pulses.   Pulmonary/Chest:  Effort normal and breath sounds normal. No respiratory distress.  Abdominal: Soft. There is no tenderness.  Neurological: She is alert and oriented to person, place, and time. She has normal strength. No cranial nerve deficit or sensory deficit. She displays a negative Romberg sign. Gait normal. GCS eye subscore is 4. GCS verbal subscore is 5. GCS motor subscore is 6.  No pronator drift. Bilateral heel-knee-shin intact.  Skin: Skin is warm and dry. She is not diaphoretic.    ED Course  Procedures (including critical care time) Labs Review Labs Reviewed  CBC - Abnormal; Notable for the following:    Hemoglobin 11.8 (*)    HCT 35.9 (*)    All other components within normal limits  COMPREHENSIVE METABOLIC PANEL - Abnormal; Notable for the following:    Potassium 3.3 (*)    Total Bilirubin 0.2 (*)    GFR calc non Af Amer 79 (*)    All other components within normal limits  LIPASE, BLOOD   Imaging Review Dg Chest 2 View  03/06/2013   CLINICAL DATA:  Cough and chest tightness  EXAM: CHEST  2 VIEW  COMPARISON:  05/01/2012  FINDINGS: The heart size and mediastinal contours are within normal limits. Both lungs are clear. Mild S-shaped curvature of the thoracolumbar spine. Cholecystectomy changes.  IMPRESSION: No active cardiopulmonary disease.   Electronically Signed   By: Tiburcio Pea M.D.   On: 03/06/2013 21:12    EKG Interpretation   None       MDM   1. URI (upper respiratory infection)   2. Cough   3. Costochondritis     Afebrile, NAD, non-toxic appearing, AAOx4. I have reviewed nursing notes, vital signs, and all appropriate lab and imaging results for this patient. No neurofocal deficits. VSS.   Pt CXR negative for acute infiltrate. Patients symptoms are consistent with URI, likely viral etiology. Discussed that antibiotics are not indicated for viral infections. Pt will be discharged with symptomatic treatment. Left sided pain likely d/t costochondritis.  Tolerated PO  intake in ED w/o difficulty. Verbalizes understanding and is agreeable with plan. Pt is hemodynamically stable & in NAD prior to dc. Return precautions discussed. Patient is agreeable to plan. Patient is stable at time of discharge.  Patient d/w with Dr. Juleen China, agrees with plan.     Jeannetta Ellis, PA-C 03/07/13 0034

## 2013-03-06 NOTE — ED Notes (Signed)
Pt c/o flu like symptoms x 11 days, Pt went to urgent care on Sunday, Pt has abdominal pain but isn't sure if it is coming from coughing or other, no nausea or vomiting, no diarrhea, Pt has cough but is dry cough with no production, Pts throat is dry but not sore

## 2013-03-06 NOTE — ED Notes (Signed)
Pt states she was seen at [facility] on Sunday and diagnosed with URI.  Reports she is still having chills and non-productive cough.  Now c/o L sided upper abd pain since Monday that is worse with coughing.  C/o dizziness that started today.

## 2013-03-07 NOTE — ED Provider Notes (Signed)
Medical screening examination/treatment/procedure(s) were performed by non-physician practitioner and as supervising physician I was immediately available for consultation/collaboration.  EKG Interpretation   None        Libia Fazzini, MD 03/07/13 0105 

## 2013-03-13 ENCOUNTER — Encounter: Payer: Self-pay | Admitting: Advanced Practice Midwife

## 2013-03-13 ENCOUNTER — Encounter: Payer: Self-pay | Admitting: Obstetrics

## 2013-03-13 ENCOUNTER — Ambulatory Visit (INDEPENDENT_AMBULATORY_CARE_PROVIDER_SITE_OTHER): Payer: BC Managed Care – PPO | Admitting: Advanced Practice Midwife

## 2013-03-13 VITALS — BP 136/78 | HR 73 | Temp 98.5°F | Ht 63.0 in | Wt 171.0 lb

## 2013-03-13 DIAGNOSIS — Z01419 Encounter for gynecological examination (general) (routine) without abnormal findings: Secondary | ICD-10-CM

## 2013-03-13 DIAGNOSIS — Z0000 Encounter for general adult medical examination without abnormal findings: Secondary | ICD-10-CM

## 2013-03-13 LAB — POCT URINALYSIS DIPSTICK
Blood, UA: NEGATIVE
Glucose, UA: NEGATIVE
Ketones, UA: NEGATIVE
Nitrite, UA: NEGATIVE
Protein, UA: NEGATIVE
Spec Grav, UA: 1.02
Urobilinogen, UA: NEGATIVE
pH, UA: 5

## 2013-03-13 NOTE — Progress Notes (Signed)
Subjective:    Carol Wilkins is a 57 y.o. female who presents for an annual exam. The patient has no complaints today. The patient is sexually active. GYN screening history: Patient has had a hysterectomy, no recent pap.. The patient wears seatbelts: yes. The patient participates in regular exercise: no. Has the patient ever been transfused or tattooed?: no. The patient reports that there is not domestic violence in her life.   Patient reports feeling a bulge in her vagina. She reports mild vaginal dryness. Uses a lubricant. Denies pain w/ intercourse. Denies urinary or stool complications. Suffers from mild constipation.   Has had polyps in the past and cannot afford a colonoscopy at this time.   Menstrual History: OB History   Grav Para Term Preterm Abortions TAB SAB Ect Mult Living                  Menarche age: NA  No LMP recorded. Patient has had a hysterectomy.    The following portions of the patient's history were reviewed and updated as appropriate: allergies, current medications, past family history, past medical history, past social history, past surgical history and problem list.  Review of Systems A comprehensive review of systems was negative.    Objective:    BP 136/78  Pulse 73  Temp(Src) 98.5 F (36.9 C)  Ht 5\' 3"  (1.6 m)  Wt 171 lb (77.565 kg)  BMI 30.30 kg/m2  General Appearance:    Alert, cooperative, no distress, appears stated age  Head:    Normocephalic, without obvious abnormality, atraumatic  Eyes:    PERRL, conjunctiva/corneas clear, EOM's intact, fundi    benign, both eyes  Ears:    Normal TM's and external ear canals, both ears  Nose:   Nares normal, septum midline, mucosa normal, no drainage    or sinus tenderness  Throat:   Lips, mucosa, and tongue normal; teeth and gums normal  Neck:   Supple, symmetrical, trachea midline, no adenopathy;    thyroid:  no enlargement/tenderness/nodules; no carotid   bruit or JVD  Back:     Symmetric, no  curvature, ROM normal, no CVA tenderness  Lungs:     Clear to auscultation bilaterally, respirations unlabored  Chest Wall:    No tenderness or deformity   Heart:    Regular rate and rhythm, S1 and S2 normal, no murmur, rub   or gallop  Breast Exam:    No tenderness, masses, or nipple abnormality  Abdomen:     Soft, non-tender, bowel sounds active all four quadrants,    no masses, no organomegaly  Genitalia:    Normal female without lesion, discharge or tenderness, no rectocele or cystocele noted  Rectal:    Normal tone, normal prostate, no masses or tenderness;   guaiac negative stool  Extremities:   Extremities normal, atraumatic, no cyanosis or edema  Pulses:   2+ and symmetric all extremities  Skin:   Skin color, texture, turgor normal, no rashes or lesions  Lymph nodes:   Cervical, supraclavicular, and axillary nodes normal  Neurologic:   CNII-XII intact, normal strength, sensation and reflexes    throughout  .    Assessment:    Healthy female exam.  H/O Hysterectomy Patient Active Problem List   Diagnosis Date Noted  . Low back pain 01/25/2012  . Abdominal pain 11/02/2011  . Obesity (BMI 30.0-34.9) 10/31/2011  . Post herpetic neuralgia 09/14/2011  . DEPRESSION 12/22/2006  . HYPERTENSION 12/22/2006  . ALLERGIC RHINITIS 12/22/2006  .  PALPITATIONS, HX OF 12/22/2006      Plan:     Hemocult today negative  No indication of further GYN screening at this time Encouraged patient to cont w/ annual mammogram Encouraged patient to have colonoscopy Patient to cont care w/ PCP  40 min spent with patient greater than 80% spent in counseling and coordination of care.   Mohamed Portlock Wilson Singer CNM

## 2013-06-18 ENCOUNTER — Other Ambulatory Visit (HOSPITAL_COMMUNITY): Payer: Self-pay | Admitting: Family Medicine

## 2013-06-18 DIAGNOSIS — Z1231 Encounter for screening mammogram for malignant neoplasm of breast: Secondary | ICD-10-CM

## 2013-07-15 IMAGING — CT CT ABD-PELV W/ CM
2 of 5 series · 13 of 42 positions shown, 19 images · IV contrast (omnipaque)
Comparison: Abdominal pelvic CT 08/27/2009.

CLINICAL DATA: Nausea.  Pancreatitis.

CT ABDOMEN AND PELVIS WITH CONTRAST
TECHNIQUE: Multidetector CT imaging of the abdomen and pelvis was
performed following the standard protocol during bolus
administration of intravenous contrast.
Contrast: 100mL OMNIPAQUE IOHEXOL 300 MG/ML  SOLN

[Series 2: routine abdomen · axial · 0.70mm/px · z∈[-367,-42]mm · 10 of 81 slices shown, 16 images]
[im 8/81  soft-tissue]
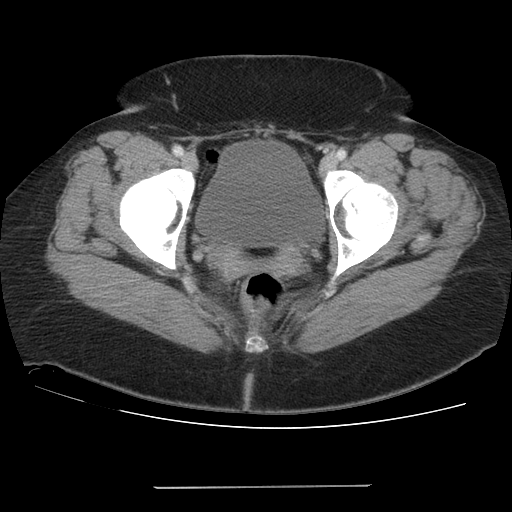
[im 8/81  bone]
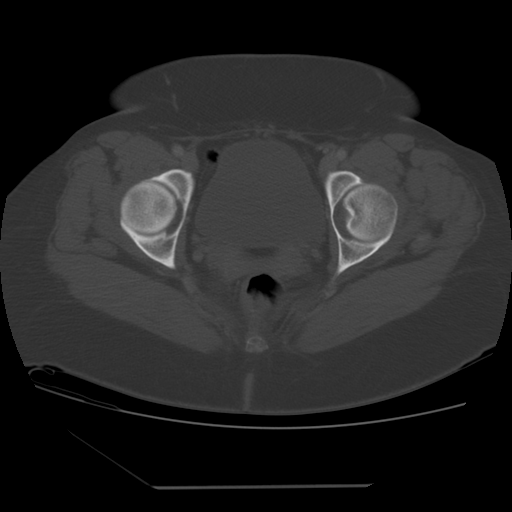
[im 15/81  soft-tissue]
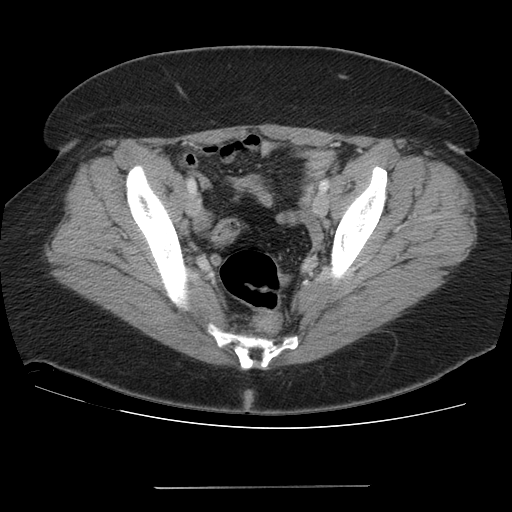
[im 22/81  soft-tissue]
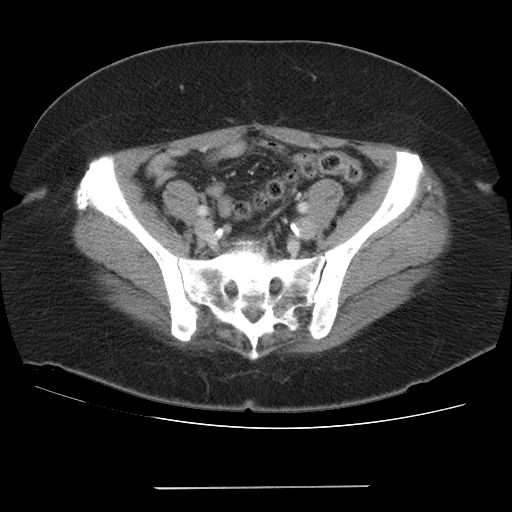
[im 30/81  soft-tissue]
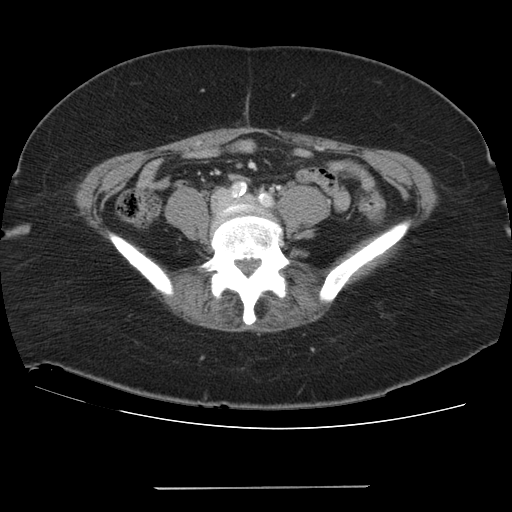
[im 37/81  soft-tissue]
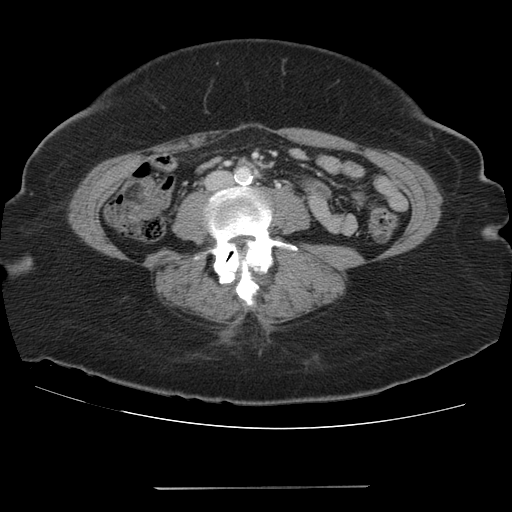
[im 44/81  soft-tissue]
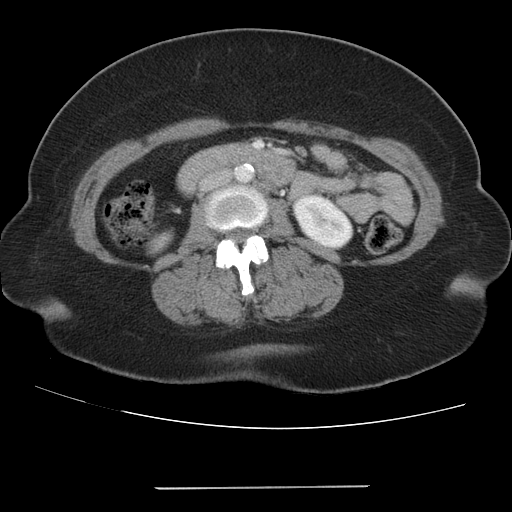
[im 51/81  soft-tissue]
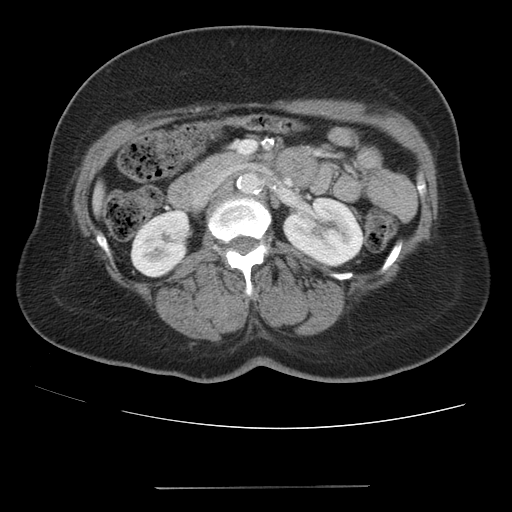
[im 51/81  lung]
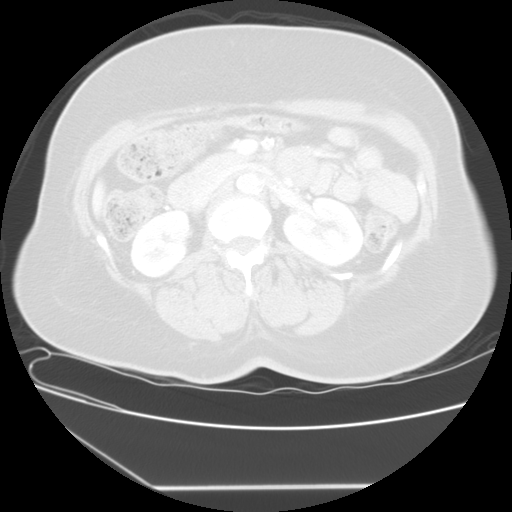
[im 59/81  soft-tissue]
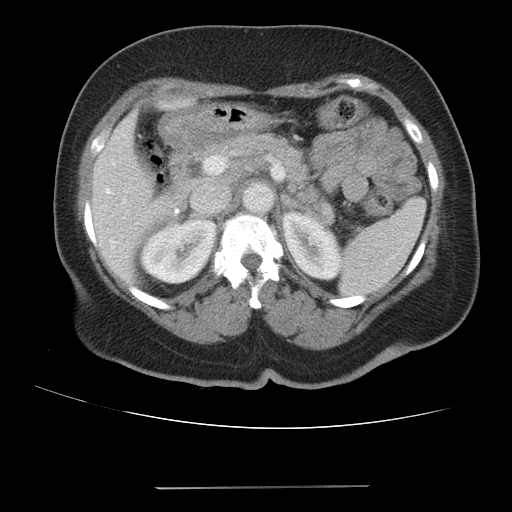
[im 59/81  lung]
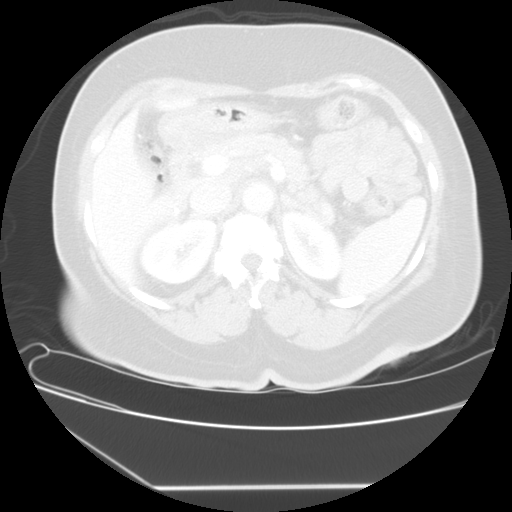
[im 66/81  soft-tissue]
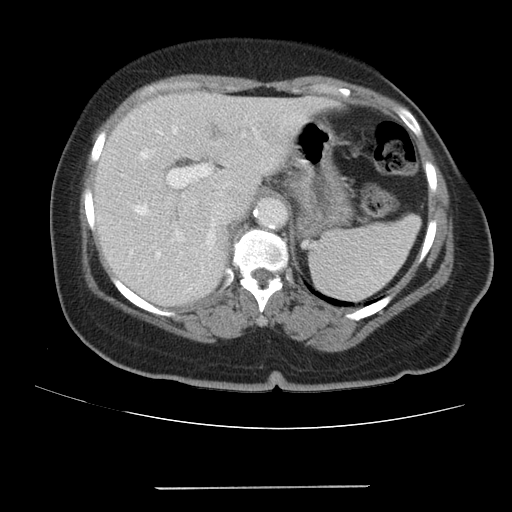
[im 66/81  lung]
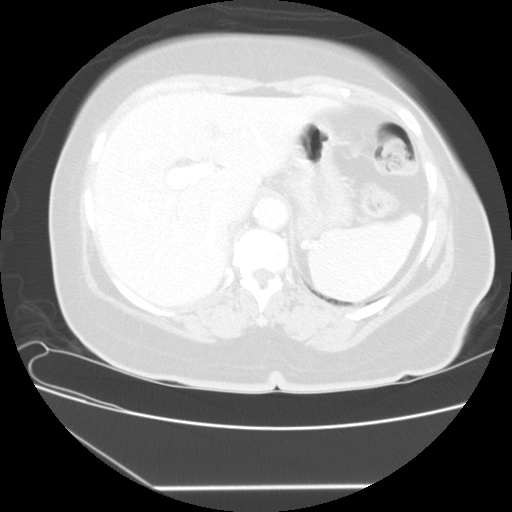
[im 66/81  bone]
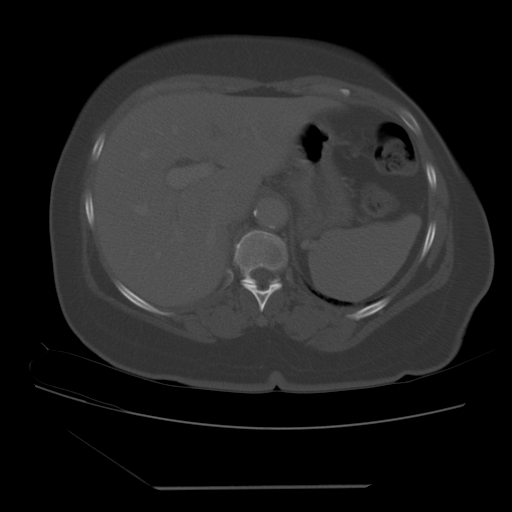
[im 73/81  soft-tissue]
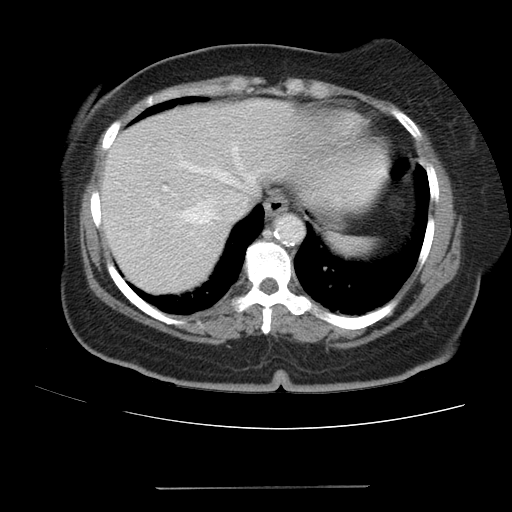
[im 73/81  lung]
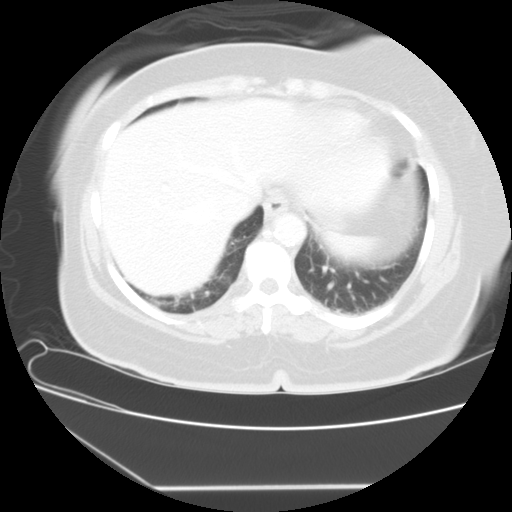

[Series 400: cor · coronal · 0.90mm/px · 3 of 90 slices shown]
[im 30/90  soft-tissue]
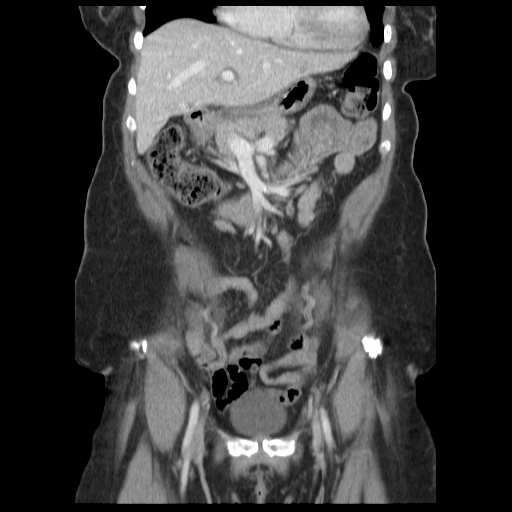
[im 40/90  soft-tissue]
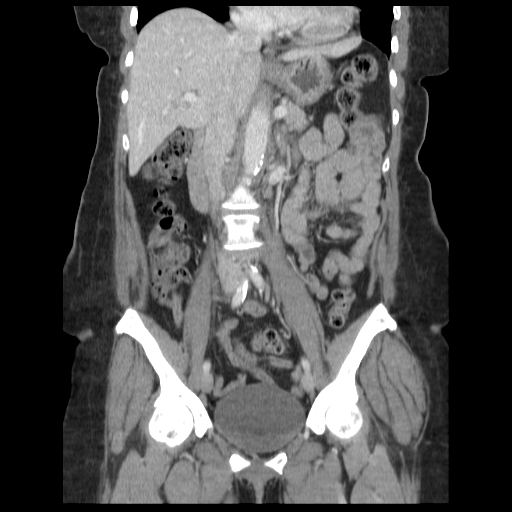
[im 50/90  soft-tissue]
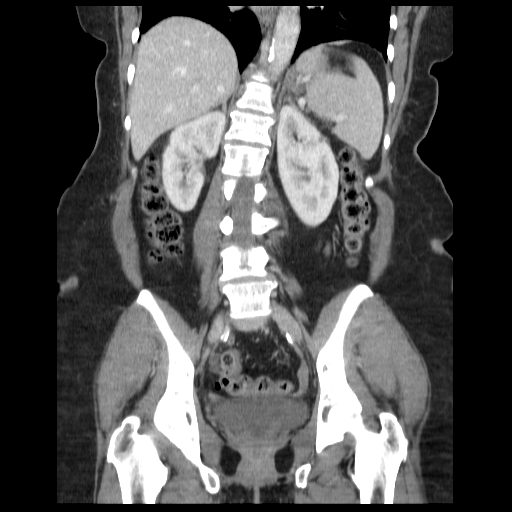

[13 of 42 positions shown; findings below may reference images not displayed]

FINDINGS: There is stable mild scarring or atelectasis at both lung
bases.  No significant pleural effusion is seen.

There is stable mild prominence of the biliary system status post
cholecystectomy.  The pancreas demonstrates no focal mass,
surrounding inflammatory change or fluid collection.  There is no
significant pancreatic ductal dilatation.

The spleen and adrenal glands appear normal.  There is an 8 mm cyst
in the upper pole of the left kidney which has mildly enlarged.  No
enhancing renal lesions are identified.  There is no
hydronephrosis.

There is aorto iliac and proximal visceral artery atherosclerosis
with prominent involvement of the superior mesenteric artery.  No
large vessel occlusion is seen.  The portal, superior mesenteric
and splenic veins are patent.  Stool is present throughout the
colon.  There is no bowel wall thickening or evidence of bowel
obstruction.  There is no adnexal mass or pelvic inflammatory
process status post partial hysterectomy.  The bladder appears
normal.

There is diffuse facet disease throughout the lumbar spine with a
stable grade 1 anterolisthesis at L4-L5 and a convex right
scoliosis.
IMPRESSION: 1.  No evidence of complicated pancreatitis or other acute process.
2.  No significant biliary dilatation status post cholecystectomy.
3.  Small left renal cyst, diffuse atherosclerosis and lumbar
spondylosis noted.

## 2013-07-16 ENCOUNTER — Ambulatory Visit (HOSPITAL_COMMUNITY): Payer: BC Managed Care – PPO

## 2013-07-18 ENCOUNTER — Ambulatory Visit (HOSPITAL_COMMUNITY)
Admission: RE | Admit: 2013-07-18 | Discharge: 2013-07-18 | Disposition: A | Discharge status: Home / Self Care | Payer: 59 | Source: Ambulatory Visit | Attending: Family Medicine | Admitting: Family Medicine

## 2013-07-18 DIAGNOSIS — Z1231 Encounter for screening mammogram for malignant neoplasm of breast: Secondary | ICD-10-CM

## 2014-06-18 ENCOUNTER — Encounter (HOSPITAL_COMMUNITY): Payer: Self-pay

## 2014-06-18 ENCOUNTER — Emergency Department (HOSPITAL_COMMUNITY)
Admission: EM | Admit: 2014-06-18 | Discharge: 2014-06-18 | Disposition: A | Discharge status: Home / Self Care | Payer: 59 | Attending: Emergency Medicine | Admitting: Emergency Medicine

## 2014-06-18 DIAGNOSIS — Z79899 Other long term (current) drug therapy: Secondary | ICD-10-CM | POA: Diagnosis not present

## 2014-06-18 DIAGNOSIS — Z7982 Long term (current) use of aspirin: Secondary | ICD-10-CM | POA: Insufficient documentation

## 2014-06-18 DIAGNOSIS — Z880 Allergy status to penicillin: Secondary | ICD-10-CM | POA: Diagnosis not present

## 2014-06-18 DIAGNOSIS — M545 Low back pain: Secondary | ICD-10-CM | POA: Diagnosis not present

## 2014-06-18 DIAGNOSIS — Z87891 Personal history of nicotine dependence: Secondary | ICD-10-CM | POA: Insufficient documentation

## 2014-06-18 DIAGNOSIS — R51 Headache: Secondary | ICD-10-CM | POA: Diagnosis not present

## 2014-06-18 DIAGNOSIS — Z8659 Personal history of other mental and behavioral disorders: Secondary | ICD-10-CM | POA: Diagnosis not present

## 2014-06-18 DIAGNOSIS — I10 Essential (primary) hypertension: Secondary | ICD-10-CM | POA: Diagnosis not present

## 2014-06-18 DIAGNOSIS — M549 Dorsalgia, unspecified: Secondary | ICD-10-CM

## 2014-06-18 NOTE — ED Notes (Signed)
Pt with headache, back pain, leg pain x 2 weeks.

## 2014-06-18 NOTE — ED Provider Notes (Signed)
CSN: [id number]     Arrival date & time 06/18/14  1715 History   First MD Initiated Contact with Patient 06/18/14 1903     Chief Complaint  Patient presents with  . Headache  . Back Pain    HPI Comments: 59 YOF presents with 2 months of worsening lower back pain. She states that the pain has gradually come on over the last few months. The pain is sharp in nature located in the lower back with radiation down her left leg. The pain is made worse with forward hip flexion, and worse in the morning. She denies loss of distal extremity strength or function, no loss/change in bowel or bladder functioning. No history of trauma. Pt states that she does not like taking medication so she has not tried therapy at home. Also notes she attempted to make an appointment with her PCP but could not be seen for two weeks.   Pt also notes a mild headache that she describes as pressure located behind her eyes anf forehead. No other associated symptoms, and states this is similar to previous headaches and is getting better.     Patient is a 59 y.o. female presenting with headaches and back pain.  Headache Associated symptoms: back pain   Back Pain Associated symptoms: headaches        Past Medical History  Diagnosis Date  . Hypertension   . Anxiety attack    Past Surgical History  Procedure Laterality Date  . Cholecystectomy    . Tubal ligation    . Abdominal hysterectomy     Family History  Problem Relation Age of Onset  . Heart disease Mother   . Diabetes Mother   . Hypertension Mother   . Diabetes Father   . Hypertension Father    History  Substance Use Topics  . Smoking status: Former Smoker    Quit date: 09/13/1980  . Smokeless tobacco: Not on file  . Alcohol Use: No   OB History    No data available     Review of Systems  Musculoskeletal: Positive for back pain.  Neurological: Positive for headaches.  All other systems reviewed and are negative.     Allergies   Penicillins  Home Medications   Prior to Admission medications   Medication Sig Start Date End Date Taking? Authorizing Provider  amLODipine (NORVASC) 5 MG tablet Take 1 tablet (5 mg total) by mouth daily. 01/25/12 06/18/14 Yes Jacquelyn A McGill, MD  hydrochlorothiazide (MICROZIDE) 12.5 MG capsule Take 12.5 mg by mouth daily. 05/25/14  Yes Historical Provider, MD  aspirin 325 MG EC tablet Take 325 mg by mouth daily.    Historical Provider, MD  ibuprofen (ADVIL,MOTRIN) 800 MG tablet Take 1 tablet (800 mg total) by mouth 3 (three) times daily. Patient not taking: Reported on 06/18/2014 03/06/13   Stephani Police Piepenbrink, PA-C  lisinopril-hydrochlorothiazide (ZESTORETIC) 20-12.5 MG per tablet Take 2 tablets by mouth daily. 01/25/12 03/06/13  Jacquelyn A McGill, MD   BP 165/85 mmHg  Pulse 75  Temp(Src) 98 F (36.7 C) (Oral)  Resp 18  SpO2 100% Physical Exam  Constitutional: She is oriented to person, place, and time. She appears well-developed and well-nourished.  HENT:  Head: Normocephalic and atraumatic.  Neck: Normal range of motion. Neck supple.  Cardiovascular: Normal rate, regular rhythm and normal heart sounds.   Pulmonary/Chest: Effort normal and breath sounds normal.  Musculoskeletal: Normal range of motion.  Lower back pain and sharp radiation into left leg with forward  felxion  Neurological: She is alert and oriented to person, place, and time. No cranial nerve deficit. Coordination normal.  Skin: Skin is warm and dry.  Psychiatric: She has a normal mood and affect. Her behavior is normal. Judgment and thought content normal.    ED Course  Procedures (including critical care time) Labs Review Labs Reviewed - No data to display  Imaging Review No results found.   EKG Interpretation None      MDM   Final diagnoses:  Back pain, unspecified location    Pt was given the options for pain management and agreed with OTC Ibuprofen 400 three times a day for lower back pain.  She was encouraged to follow-up this week with her PCP for further management. Educated for red flags relating to back pain and encouraged to seek immediate medical care if symptoms worsen.      Stevie Kern Marceline Napierala, PA-C 06/18/14 [place], MD 06/19/14 [phone]

## 2014-06-18 NOTE — Discharge Instructions (Signed)
Back Exercises Back exercises help treat and prevent back injuries. The goal of back exercises is to increase the strength of your abdominal and back muscles and the flexibility of your back. These exercises should be started when you no longer have back pain. Back exercises include:  Pelvic Tilt. Lie on your back with your knees bent. Tilt your pelvis until the lower part of your back is against the floor. Hold this position 5 to 10 sec and repeat 5 to 10 times.  Knee to Chest. Pull first 1 knee up against your chest and hold for 20 to 30 seconds, repeat this with the other knee, and then both knees. This may be done with the other leg straight or bent, whichever feels better.  Sit-Ups or Curl-Ups. Bend your knees 90 degrees. Start with tilting your pelvis, and do a partial, slow sit-up, lifting your trunk only 30 to 45 degrees off the floor. Take at least 2 to 3 seconds for each sit-up. Do not do sit-ups with your knees out straight. If partial sit-ups are difficult, simply do the above but with only tightening your abdominal muscles and holding it as directed.  Hip-Lift. Lie on your back with your knees flexed 90 degrees. Push down with your feet and shoulders as you raise your hips a couple inches off the floor; hold for 10 seconds, repeat 5 to 10 times.  Back arches. Lie on your stomach, propping yourself up on bent elbows. Slowly press on your hands, causing an arch in your low back. Repeat 3 to 5 times. Any initial stiffness and discomfort should lessen with repetition over time.  Shoulder-Lifts. Lie face down with arms beside your body. Keep hips and torso pressed to floor as you slowly lift your head and shoulders off the floor. Do not overdo your exercises, especially in the beginning. Exercises may cause you some mild back discomfort which lasts for a few minutes; however, if the pain is more severe, or lasts for more than 15 minutes, do not continue exercises until you see your caregiver.  Improvement with exercise therapy for back problems is slow.  See your caregivers for assistance with developing a proper back exercise program. Document Released: 05/12/2004 Document Revised: 06/27/2011 Document Reviewed: 02/03/2011 [facility] Patient Information 2015 [place], [place]. This information is not intended to replace advice given to you by your health care provider. Make sure you discuss any questions you have with your health care provider.  Pt advised to avoid aggravating activities, use warm compress, and Ibuprofen 400 mg three times a day for 7-10 days. Please follow-up with PCP for further management.

## 2014-06-18 NOTE — ED Provider Notes (Signed)
  Face-to-face evaluation   History: Ongoing low back pain radiating to left leg, for 4 weeks. No specific trauma.  Physical exam: Alert, calm, cooperative. Mild diffuse lumbar tenderness. Normal range of motion. Back and legs. She is able to bear weight easily.  Medical screening examination/treatment/procedure(s) were conducted as a shared visit with non-physician practitioner(s) and myself.  I personally evaluated the patient during the encounter  Richarda Blade, MD 06/19/14 1556

## 2014-07-03 ENCOUNTER — Other Ambulatory Visit: Payer: Self-pay | Admitting: Family Medicine

## 2014-07-03 DIAGNOSIS — R0989 Other specified symptoms and signs involving the circulatory and respiratory systems: Secondary | ICD-10-CM

## 2014-07-07 ENCOUNTER — Ambulatory Visit
Admission: RE | Admit: 2014-07-07 | Discharge: 2014-07-07 | Disposition: A | Discharge status: Home / Self Care | Payer: 59 | Source: Ambulatory Visit | Attending: Family Medicine | Admitting: Family Medicine

## 2014-07-07 DIAGNOSIS — R0989 Other specified symptoms and signs involving the circulatory and respiratory systems: Secondary | ICD-10-CM

## 2014-07-21 ENCOUNTER — Encounter (HOSPITAL_COMMUNITY): Payer: Self-pay | Admitting: *Deleted

## 2014-07-21 ENCOUNTER — Emergency Department (HOSPITAL_COMMUNITY)
Admission: EM | Admit: 2014-07-21 | Discharge: 2014-07-21 | Disposition: A | Discharge status: Home / Self Care | Payer: 59 | Source: Home / Self Care | Attending: Emergency Medicine | Admitting: Emergency Medicine

## 2014-07-21 DIAGNOSIS — J029 Acute pharyngitis, unspecified: Secondary | ICD-10-CM | POA: Diagnosis not present

## 2014-07-21 LAB — POCT RAPID STREP A: Streptococcus, Group A Screen (Direct): NEGATIVE

## 2014-07-21 MED ORDER — CLINDAMYCIN HCL 300 MG PO CAPS: 300.0000 mg | ORAL_CAPSULE | Freq: Three times a day (TID) | ORAL | Status: DC

## 2014-07-21 NOTE — ED Notes (Signed)
C/o dry mouth, sore throat, ears stopped up, hears heartbeat in her L ear, and occ. Cough. C/o chills but has not noted a fever.  C/o eyes being matted in AM since last week. Has been using Renew eye drops without relief.

## 2014-07-21 NOTE — Discharge Instructions (Signed)
You have pharyngitis. Take clindamycin 3 times a day for 10 days. Continue the allergy medicine. Use Chloraseptic spray or Cepacol lozenges for the throat pain. If you are not any better after finishing the antibiotic, please follow-up with ENT.

## 2014-07-21 NOTE — ED Provider Notes (Signed)
CSN: [id number]     Arrival date & time 07/21/14  1722 History   First MD Initiated Contact with Patient 07/21/14 1912     Chief Complaint  Patient presents with  . Sore Throat   (Consider location/radiation/quality/duration/timing/severity/associated sxs/prior Treatment) HPI She is a 59 year old woman here for evaluation of sore throat. She states for the last 2-3 weeks she has had fullness of her ears, sore throat, and mild cough. She denies any ear pain, nasal congestion, rhinorrhea. She does have some itchy eyes with discharge. No shortness of breath. She reports chills, but no fevers. Her appetite is normal. She has tried both Human resources officer and Claritin without improvement.  Past Medical History  Diagnosis Date  . Hypertension   . Anxiety attack    Past Surgical History  Procedure Laterality Date  . Tubal ligation    . Cholecystectomy      in the 90's  . Tubal ligation  1980  . Abdominal hysterectomy      in early 32's   Family History  Problem Relation Age of Onset  . Heart disease Mother   . Diabetes Mother   . Hypertension Mother   . Diabetes Father   . Hypertension Father   . Diabetes Sister   . Hypertension Sister   . Heart disease Sister   . Diabetes Brother   . Hypertension Brother   . Heart disease Brother    History  Substance Use Topics  . Smoking status: Former Smoker    Quit date: 09/13/1980  . Smokeless tobacco: Not on file  . Alcohol Use: No   OB History    No data available     Review of Systems  Constitutional: Positive for chills. Negative for fever and appetite change.  HENT: Positive for hearing loss and sore throat. Negative for congestion, ear pain and rhinorrhea.   Respiratory: Positive for cough. Negative for shortness of breath.   Gastrointestinal: Negative for nausea and vomiting.    Allergies  Penicillins  Home Medications   Prior to Admission medications   Medication Sig Start Date End Date Taking? Authorizing Provider   amLODipine (NORVASC) 5 MG tablet Take 1 tablet (5 mg total) by mouth daily. 01/25/12 07/21/14 Yes Jacquelyn A McGill, MD  hydrochlorothiazide (MICROZIDE) 12.5 MG capsule Take 12.5 mg by mouth daily. 05/25/14  Yes Historical Provider, MD  olmesartan (BENICAR) 20 MG tablet Take 20 mg by mouth daily.   Yes Historical Provider, MD  clindamycin (CLEOCIN) 300 MG capsule Take 1 capsule (300 mg total) by mouth 3 (three) times daily. 07/21/14   Melony Overly, MD  lisinopril-hydrochlorothiazide (ZESTORETIC) 20-12.5 MG per tablet Take 2 tablets by mouth daily. 01/25/12 03/06/13  Jacquelyn A McGill, MD   BP 156/81 mmHg  Pulse 104  Temp(Src) 97.4 F (36.3 C) (Oral)  Resp 16  SpO2 97% Physical Exam  Constitutional: She is oriented to person, place, and time. She appears well-developed and well-nourished. No distress.  HENT:  Head: Normocephalic and atraumatic.  Right Ear: Tympanic membrane normal.  Left Ear: A middle ear effusion (clear fluid) is present.  Nose: No mucosal edema or rhinorrhea.  Mouth/Throat: Posterior oropharyngeal erythema present. No oropharyngeal exudate.  Left tonsil is swollen and erythematous compared to the right.  Neck: Neck supple.  Cardiovascular: Normal rate, regular rhythm and normal heart sounds.   No murmur heard. Pulmonary/Chest: Effort normal and breath sounds normal. No respiratory distress. She has no wheezes. She has no rales.  Lymphadenopathy:    She  has no cervical adenopathy.  Neurological: She is oriented to person, place, and time.    ED Course  Procedures (including critical care time) Labs Review Labs Reviewed  CULTURE, GROUP A STREP  POCT RAPID STREP A ([facility] CARE ONLY)    Imaging Review No results found.   MDM   1. Pharyngitis    We'll treat with clindamycin. Continue allergy medicine. Follow-up with ENT if no improvement after antibiotics.    Melony Overly, MD 07/21/14 2003

## 2014-07-24 LAB — CULTURE, GROUP A STREP

## 2014-07-29 NOTE — ED Notes (Signed)
Throat culture: Strep beta hemolytic not group A.  Pt. Adequately treated with Cleocin.  Needs notified. Roselyn Meier 07/29/2014

## 2014-08-01 ENCOUNTER — Telehealth (HOSPITAL_COMMUNITY): Payer: Self-pay | Admitting: *Deleted

## 2014-08-01 NOTE — ED Notes (Signed)
Throat culture: Strep beta hemolytic not group A.  I called pt. Pt. verified x 2 and given result.  Pt. told that she was adequately treated with Clindamycin.  She said she is better. Instructed to notify her contacts. Roselyn Meier 08/01/2014

## 2015-05-19 ENCOUNTER — Encounter (HOSPITAL_COMMUNITY): Payer: Self-pay | Admitting: Cardiology

## 2015-05-19 ENCOUNTER — Emergency Department (HOSPITAL_COMMUNITY): Payer: BLUE CROSS/BLUE SHIELD

## 2015-05-19 ENCOUNTER — Emergency Department (HOSPITAL_COMMUNITY)
Admission: EM | Admit: 2015-05-19 | Discharge: 2015-05-19 | Disposition: A | Discharge status: Home / Self Care | Payer: BLUE CROSS/BLUE SHIELD | Attending: Emergency Medicine | Admitting: Emergency Medicine

## 2015-05-19 DIAGNOSIS — R079 Chest pain, unspecified: Secondary | ICD-10-CM

## 2015-05-19 DIAGNOSIS — F419 Anxiety disorder, unspecified: Secondary | ICD-10-CM | POA: Insufficient documentation

## 2015-05-19 DIAGNOSIS — Z7982 Long term (current) use of aspirin: Secondary | ICD-10-CM | POA: Insufficient documentation

## 2015-05-19 DIAGNOSIS — Z880 Allergy status to penicillin: Secondary | ICD-10-CM | POA: Diagnosis not present

## 2015-05-19 DIAGNOSIS — Z87891 Personal history of nicotine dependence: Secondary | ICD-10-CM | POA: Diagnosis not present

## 2015-05-19 DIAGNOSIS — I10 Essential (primary) hypertension: Secondary | ICD-10-CM | POA: Insufficient documentation

## 2015-05-19 DIAGNOSIS — Z79899 Other long term (current) drug therapy: Secondary | ICD-10-CM | POA: Diagnosis not present

## 2015-05-19 LAB — BASIC METABOLIC PANEL
ANION GAP: 8 (ref 5–15)
BUN: 15 mg/dL (ref 6–20)
CALCIUM: 9.7 mg/dL (ref 8.9–10.3)
CHLORIDE: 105 mmol/L (ref 101–111)
CO2: 28 mmol/L (ref 22–32)
CREATININE: 0.82 mg/dL (ref 0.44–1.00)
GFR calc non Af Amer: >60 mL/min (ref >60)
GLUCOSE: 138 mg/dL — AB (ref 65–99)
Potassium: 3.6 mmol/L (ref 3.5–5.1)
Sodium: 141 mmol/L (ref 135–145)

## 2015-05-19 LAB — CBC
HCT: 39.4 % (ref 36.0–46.0)
HEMOGLOBIN: 12.4 g/dL (ref 12.0–15.0)
MCH: 26.6 pg (ref 26.0–34.0)
MCHC: 31.5 g/dL (ref 30.0–36.0)
MCV: 84.5 fL (ref 78.0–100.0)
Platelets: 204 10*3/uL (ref 150–400)
RBC: 4.66 MIL/uL (ref 3.87–5.11)
RDW: 15.5 % (ref 11.5–15.5)
WBC: 3.8 10*3/uL — ABNORMAL LOW (ref 4.0–10.5)

## 2015-05-19 LAB — I-STAT TROPONIN, ED: TROPONIN I, POC: 0.01 ng/mL (ref 0.00–0.08)

## 2015-05-19 LAB — TROPONIN I: Troponin I: <0.03 ng/mL (ref <0.031)

## 2015-05-19 MED ORDER — GI COCKTAIL ~~LOC~~
30.0000 mL | Freq: Once | ORAL | Status: AC
  Administered 2015-05-19: 30 mL via ORAL
  Filled 2015-05-19: qty 30

## 2015-05-19 MED ORDER — ASPIRIN 81 MG PO CHEW
324.0000 mg | CHEWABLE_TABLET | Freq: Once | ORAL | Status: AC
  Administered 2015-05-19: 324 mg via ORAL
  Filled 2015-05-19: qty 4

## 2015-05-19 NOTE — ED Provider Notes (Signed)
CSN: [id number]     Arrival date & time 05/19/15  1203 History   First MD Initiated Contact with Patient 05/19/15 1359     Chief Complaint  Patient presents with  . Chest Pain   HPI    60 year old female presents today with chest pain. Patient reports that she woke up in the middle the night with centralized chest pain, palpitations, lightheadedness and "anxiety". She was able to fall back to sleep but felt sluggish this morning upon awakening. She reports palpitations with centralized chest pain again throughout the day, this has been intermittent coming and going lasting couple minutes at a time. She denies any exacerbating or alleviating factors. Patient does note some baseline shortness of breath with ambulation, she denies any diaphoresis, nausea, vomiting, radiation of pain. Patient denies any significant PE or DVT risk factors, URI complaints.   Pt reports that she was seen by cardiology at [facility] cardiology for palpitations with a reported normal stress test yesterday    Past Medical History  Diagnosis Date  . Hypertension   . Anxiety attack    Past Surgical History  Procedure Laterality Date  . Tubal ligation    . Cholecystectomy      in the 90's  . Tubal ligation  1980  . Abdominal hysterectomy      in early 77's   Family History  Problem Relation Age of Onset  . Heart disease Mother   . Diabetes Mother   . Hypertension Mother   . Diabetes Father   . Hypertension Father   . Diabetes Sister   . Hypertension Sister   . Heart disease Sister   . Diabetes Brother   . Hypertension Brother   . Heart disease Brother    Social History  Substance Use Topics  . Smoking status: Former Smoker    Quit date: 09/13/1980  . Smokeless tobacco: None  . Alcohol Use: No   OB History    No data available     Review of Systems  All other systems reviewed and are negative.   Allergies  Penicillins  Home Medications   Prior to Admission medications   Medication Sig  Start Date End Date Taking? Authorizing Provider  amLODipine (NORVASC) 5 MG tablet Take 1 tablet (5 mg total) by mouth daily. 01/25/12 05/19/15 Yes Jacquelyn A McGill, MD  aspirin EC 81 MG tablet Take 81 mg by mouth daily.   Yes Historical Provider, MD  atorvastatin (LIPITOR) 20 MG tablet Take 20 mg by mouth daily.   Yes Historical Provider, MD  Azilsartan Medoxomil (EDARBI) 40 MG TABS Take 40 mg by mouth daily.   Yes Historical Provider, MD  hydrochlorothiazide (MICROZIDE) 12.5 MG capsule Take 12.5 mg by mouth daily. 05/25/14  Yes Historical Provider, MD   BP 150/76 mmHg  Pulse 62  Temp(Src) 97.8 F (36.6 C) (Oral)  Resp 15  Ht 5\' 2"  (1.575 m)  Wt 83.008 kg  BMI 33.46 kg/m2  SpO2 99%   Physical Exam  Constitutional: She is oriented to person, place, and time. She appears well-developed and well-nourished.  HENT:  Head: Normocephalic and atraumatic.  Eyes: Conjunctivae are normal. Pupils are equal, round, and reactive to light. Right eye exhibits no discharge. Left eye exhibits no discharge. No scleral icterus.  Neck: Normal range of motion. No JVD present. No tracheal deviation present.  Cardiovascular: Normal rate, regular rhythm, normal heart sounds and intact distal pulses.  Exam reveals no gallop and no friction rub.   No murmur  heard. Pulmonary/Chest: Effort normal. No stridor.  Nontender to palpation  Abdominal: Bowel sounds are normal. She exhibits no mass. There is no tenderness. There is no rebound and no guarding.  Musculoskeletal: Normal range of motion. She exhibits no edema or tenderness.  Neurological: She is alert and oriented to person, place, and time. Coordination normal.  Skin: Skin is warm and dry. No rash noted. No erythema.  Psychiatric: She has a normal mood and affect. Her behavior is normal. Judgment and thought content normal.  Nursing note and vitals reviewed.   ED Course  Procedures (including critical care time) Labs Review Labs Reviewed  BASIC  METABOLIC PANEL - Abnormal; Notable for the following:    Glucose, Bld 138 (*)    All other components within normal limits  CBC - Abnormal; Notable for the following:    WBC 3.8 (*)    All other components within normal limits  TROPONIN I  Randolm Idol, ED    Imaging Review Dg Chest 2 View  05/19/2015  CLINICAL DATA:  Chest pain, dizziness, headache. EXAM: CHEST  2 VIEW COMPARISON:  03/06/2013 FINDINGS: The cardiac silhouette is mildly enlarged. Aorta is tortuous. Mediastinal contours appear intact. There is no evidence of focal airspace consolidation, pleural effusion or pneumothorax. Osseous structures are without acute abnormality. Soft tissues are grossly normal. IMPRESSION: Mildly enlarged cardiac silhouette, otherwise no evidence of acute cardiopulmonary disease. Electronically Signed   By: Fidela Salisbury M.D.   On: 05/19/2015 12:47   I have personally reviewed and evaluated these images and lab results as part of my medical decision-making.   EKG Interpretation   Date/Time:  Tuesday May 19 2015 15:41:43 EST Ventricular Rate:  59 PR Interval:  129 QRS Duration: 74 QT Interval:  416 QTC Calculation: 412 R Axis:   88 Text Interpretation:  Sinus rhythm Borderline right axis deviation Low  voltage, extremity and precordial leads Anteroseptal infarct, old  Confirmed by Hazle Coca [phone]) on 05/19/2015 3:53:53 PM      MDM   Final diagnoses:  Chest pain, unspecified chest pain type    Labs: Troponin, BMP, CBC- no significant findings  Imaging: DG chest  Consults:  Therapeutics:   Discharge Meds:   Assessment/Plan:  60 year old female presents today with complaints of chest pain. Upon my initial evaluation she described it as centralized chest pain, coming and going. She had a period of time on the ED that she was asymptomatic, again returned. She reports the pain migrated from her central chest to the lateral wall of her chest. This is very atypical chest  pain, low suspicion for ACS, she does have a heart score of 3 with no significant findings on troponin or EKG. Due to patient's persistent complaints, her cardiologist Dr. Lyda Kalata was consult and who personally lifted her EKGs, previous stress test. He recommended patient follow-up in his office this week for reevaluation. Patient appeared in no acute distress, no significant findings today that would necessitate further evaluation or management here in the ED. Patient discharged home, cardiology follow-up, should return precautions, verbalized understanding and agreement for today's plan and had no further questions or concerns at time of discharge.         Okey Regal, PA-C 05/19/15 1656  Quintella Reichert, MD 05/20/15 [phone]

## 2015-05-19 NOTE — ED Notes (Signed)
Pt reports chest pain and headache that started last night and continued to get worse this morning. Also had some SOB last night.

## 2015-05-19 NOTE — ED Notes (Signed)
Pt verbalized understanding of d/c instructions and follow-up care. No further questions/concerns, VSS, ambulatory w/ steady gait (refused wheelchair) 

## 2015-05-19 NOTE — Discharge Instructions (Signed)
°  Please follow-up with your cardiologist this week for reevaluation and further management. If any new or worsening signs or symptoms present please return immediately.

## 2015-07-13 NOTE — H&P (Signed)
OFFICE VISIT NOTES COPIED TO EPIC FOR DOCUMENTATION  . Charley L. Swindle July 24, 2015 3:25 PM Location: [facility] Patient #: [id number] DOB: October 26, 1955 Single / Language: Cleophus Molt / Race: Black or African American Female   History of Present Illness Neldon Labella AGNP-C; 07-24-2015 4:44 PM) The patient is a 60 year old female who presents for a follow-up for Palpitations. She was initially referred last year for evaluation of PACs noted on EKG. She states she went to her PCP for surveillance of known carotid artery stenosis and EKG was performed revealing PACs. Essentially asymptomatic at that time. She had GXT to evaluate for CAD which was normal. She now presents for follow-up of recent ED visit on 05/19/2015. She presented after waking with midsternal chest pain, palpitations, lightheadedness, and a feeling of anxiety. She continues to have intermittent episodes of palpitations and chest discomfort with shortness of breath, however not as severe as that initial episode. She has palpitations 5-6 days out of the week, lasting for several minutes at a time. Also reports increasing dyspnea on exertion. She was scheduled for exercise Myoview stress test which did not reveal any evidence of ischemia. At her last visit, had ordered an event monitor and echocardiogram due to worsening shortness of breath and palpitations. She presents today for follow-up.   Problem List/Past Medical Adonis Brook Beane; 2015-07-24 3:26 PM) Premature atrial beats (I49.1) Intermittent palpitations (R00.2) Event monitor 2 weeks 02/70/2017: Symptomatic transmissions palpitations, fatigue) reveals brief 3-4 beat atrial runs, PACs. No atrial fibrillation, no heart block. Asymptomatic carotid artery stenosis, bilateral (B33.83) Carotid duplex 07/07/2014: Stable appearance by duplex ultrasound of bilateral atherosclerotic plaque affecting the carotid bulbs and proximal ICAs, left greater than right. Estimated left  ICA stenosis remains 50-69%. Estimated right ICA stenosis remains less than 50%. Benign essential hypertension (I10) Hyperlipidemia, group A (E78.00) Parotid swelling (R22.0) Chest pain (R07.9) Exercise sestamibi stress test 05/29/2015: 1. Resting EKG demonstrated normal sinus rhythm, normal axis. Low voltage complexes, poor R-wave progression. The stress electrocardiogram was normal.  The patient performed treadmill exercise using a Bruce protocol, completing 5 minutes. The patient completed an estimated workload of 7.02 METS, 91% of the maximum predicted heart rate. Stress symptoms included dyspnea. The stress test was terminated because of fatigue. 2. Myocardial perfusion imaging is normal. Overall left ventricular systolic function was normal without regional wall motion abnormalities. The left ventricular ejection fraction was 77%. Shortness of breath (R06.02) Echo- 06/18/2015 1. Left ventricle cavity is normal in size. Mild concentric hypertrophy of the left ventricle. Hyperdynamic global wall motion. Calculated EF 75%. 2. Left atrial cavity is borderline dilated. Suggestion of patent foramen ovale v/s fenestrated ASD by color Doppler in apical 4 chamber view, but not visualized in subcostal view. Suggest TEE if clinically indicated. 3. Right ventricle cavity is borderline dilated. Normal right ventricular function. 4. Mild mitral regurgitation. 5. Moderate tricuspid regurgitation. Mild pulmonary hypertension with approx. PA syst. pressure of 33 mmHg. 6. IVC is normal with a respiratory response of <50%. Murmur (R01.1)  Allergies Adonis Brook Beane; 24-Jul-2015 3:26 PM) Penicillins Itching.  Family History Adonis Brook Beane; 07/24/15 3:26 PM) Mother Deceased. at age 3, from heart failure. hx of heart disease, diabetes, htn; hx of mini strokes Father alive- has diabetes, and htn Sister 44 1 younger 2 older Brother 17 all younger  Social History ([place]; 07-24-2015 3:26 PM) Current  tobacco use Former smoker. quit in 1982 Non Drinker/No Alcohol Use Marital status Single. Living Situation Lives alone. Number of Children 3.  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 Aspirin Adult Low Strength (81MG Tablet Chewable, 1 (one) Tablet Chewable Tablet Oral daily, Taken starting 08/04/2014) Active. Allegra-D Allergy & Congestion (180-240MG Tablet ER 24HR, 1 Oral as needed for allergies) Active. Hydrochlorothiazide (12.5MG Capsule, 1 Oral daily) Active. AmLODIPine Besylate (5MG Tablet, 1 Oral daily) Active. Edarbi (40MG Tablet, 1 Oral daily) Active. Medications Reconciled (Verbally with patient.)  Diagnostic Studies History Adonis Brook Beane; Jul 19, 2015 3:28 PM) Worthy Keeler 05/19/2015: CBC normal, creatinine 0.82, potassium 3.6, delta troponin negative 2 Carotid Doppler03/21/2016 Stable appearance by duplex ultrasound of bilateral atherosclerotic plaque affecting the carotid bulbs and proximal ICAs, left greater than right. Estimated left ICA stenosis remains 50-69%. Estimated right ICA stenosis remains less than 50%. Treadmill stress test05/05/2014 Indication: Screening for CAD The patient exercised according to Bruce Protocol, Total time recorded 05:00 min achieving max heart rate of 152 which was 94 % of MPHR for age and 7.03 METS of work. Normal BP response. Resting ECG NSR, poor R progression cannot R/O anterior infarct, old. Low voltage complexes. There was no ST-T changes of ischemia with exercise stress test, upsloping 0.66m ST depression at peak back to base at 1 minute in recovery. Stress terminated due to THR (>85% MPHR)/MPHR met. Chest X-ray01/31/2017 Mildly enlarged cardiac silhouette, otherwise no  evidence of acute cardiopulmonary disease Nuclear stress test02/01/2016 1. Resting EKG demonstrated normal sinus rhythm, normal axis. Low voltage complexes, poor R-wave progression. The stress electrocardiogram was normal. The patient performed treadmill exercise using a Bruce protocol, completing 5 minutes. The patient completed an estimated workload of 7.02 METS, 91% of the maximum predicted heart rate. Stress symptoms included dyspnea. The stress test was terminated because of fatigue. 2. Myocardial perfusion imaging is normal. Overall left ventricular systolic function was normal without regional wall motion abnormalities. The left ventricular ejection fraction was 77%. Echocardiogram03/05/2015 1. Left ventricle cavity is normal in size. Mild concentric hypertrophy of the left ventricle. Hyperdynamic global wall motion. Calculated EF 75%. 2. Left atrial cavity is borderline dilated. Suggestion of patent foramen ovale v/s fenestrated ASD by color Doppler in apical 4 chamber view, but not visualized in subcostal view. Suggest TEE if clinically indicated. 3. Right ventricle cavity is borderline dilated. Normal right ventricular function. 4. Mild mitral regurgitation. 5. Moderate tricuspid regurgitation. Mild pulmonary hypertension with approx. PA syst. pressure of 33 mm of Hg. 6. IVC is normal with a respiratory response of <50%. 30-Day Event Monitor03/05/2015    Review of Systems ([facility] Hail AVirginia 002-Apr-20174:44 PM) General Present- Feeling well. Not Present- Fatigue, Fever and Night Sweats. Skin Not Present- Itching and Rash. HEENT Not Present- Headache. Respiratory Present- Decreased Exercise Tolerance and Difficulty Breathing on Exertion. Cardiovascular Present- Chest Pain and Palpitations. Not Present- Claudications, Fainting, Orthopnea and Swelling of Extremities. Gastrointestinal Not Present- Abdominal Pain, Constipation, Diarrhea, Nausea and Vomiting. Musculoskeletal Not Present-  Joint Swelling. Neurological Not Present- Headaches. Hematology Not Present- Blood Clots, Easy Bruising and Nose Bleed.  Vitals (Adonis BrookBeane; 304-02-173:36 PM) 304/02/20173:26 PM Weight: 185.5 lb Height: 63in Body Surface Area: 1.87 m Body Mass Index: 32.86 kg/m  Pulse: 67 (Regular)  P.OX: 98% (Room air) BP: 164/72 (Sitting, Left Arm, Standard)       Physical Exam (Bridgette AEbony Hail AGNP-C; 004/02/20174:44 PM) General Mental Status-Alert. General Appearance-Cooperative, Appears stated age, Not in acute distress. Orientation-Oriented  X3. Build & Nutrition-Moderately built and Mildly obese.  Head and Neck Thyroid Gland Characteristics - no palpable nodules, no palpable enlargement.  Chest and Lung Exam Palpation Tender - No chest wall tenderness. Auscultation Breath sounds - Clear.  Cardiovascular Inspection Jugular vein - Right - No Distention. Auscultation Heart Sounds - S1 WNL, S2 WNL and No gallop present. Murmurs & Other Heart Sounds: Murmur - Location - Aortic Area. Timing - Early systolic. Grade - II/VI.  Abdomen Palpation/Percussion Normal exam - Non Tender and No hepatosplenomegaly. Auscultation Normal exam - Bowel sounds normal.  Peripheral Vascular Lower Extremity Inspection - Left - No Pigmentation, No Varicose veins. Right - No Pigmentation, No Varicose veins. Palpation - Edema - Left - No edema. Right - No edema. Femoral pulse - Bilateral - Normal. Popliteal pulse - Bilateral - 1+(difficult to feel due to obesity). Dorsalis pedis pulse - Bilateral - 1+. Posterior tibial pulse - Bilateral - 0+. Carotid arteries - Bilateral-Soft Bruit. Abdomen-No prominent abdominal aortic pulsation, No epigastric bruit.  Neurologic Motor-Grossly intact without any focal deficits.  Musculoskeletal Global Assessment Left Lower Extremity - normal range of motion without pain. Right Lower Extremity - normal range of motion without  pain.    Assessment & Plan (Bridgette Ebony Hail AGNP-C; 06/25/2015 4:43 PM)  Shortness of breath (R06.02) Story: Echo- 06/18/2015 1. Left ventricle cavity is normal in size. Mild concentric hypertrophy of the left ventricle. Hyperdynamic global wall motion. Calculated EF 75%. 2. Left atrial cavity is borderline dilated. Suggestion of patent foramen ovale v/s fenestrated ASD by color  Doppler in apical 4 chamber view, but not visualized in subcostal view. Suggest TEE if clinically indicated. 3. Right ventricle cavity is borderline dilated. Normal right ventricular function. 4. Mild mitral regurgitation. 5. Moderate tricuspid regurgitation. Mild pulmonary hypertension with approx. PA syst. pressure of 33 mmHg. 6. IVC is normal with a respiratory response of <50%.  Intermittent palpitations (R00.2) Story: Event monitor 2 weeks 02/70/2017: Symptomatic transmissions palpitations, fatigue) reveals brief 3-4 beat atrial runs, PACs. No atrial fibrillation, no heart block. Impression: EKG 08/04/2014: Normal sinus rhythm at the rate of 70 bpm, normal axis. No evidence of ischemia. Low voltage complexes.  Murmur (R01.1)  Asymptomatic carotid artery stenosis, bilateral (B09.62) Story: Carotid duplex 07/07/2014: Stable appearance by duplex ultrasound of bilateral atherosclerotic plaque affecting the carotid bulbs and proximal ICAs, left greater than right. Estimated left ICA stenosis remains 50-69%. Estimated right ICA stenosis remains less than 50%. ASD (atrial septal defect) (Q21.1)  Current Plans Mechanism of underlying disease process and action of medications discussed with the patient. She presents for follow-up and reevaluation after undergoing echocardiogram and event monitor for worsening palpitations and shortness of breath. Event monitor revealed brief 3-4 beat runs of atrial tachycardia with frequent PACs. No atrial fibrillation or heart block. Echocardiogram reveals PFO versus ASD with evidence  of right-sided strain. Given worsening symptoms and abnormal echocardiogram, would recommend TEE for further evaluation. I have explained risks, benefits, alternatives to TEE, including but not limited to rare esophageal perforation, bleeding, aspiration pneumonia. Follow-up after procedure for reevaluation and further recommendations.  *I have discussed this case with Dr. Einar Gip and he is in agreement with this plan.*    Signed by Neldon Labella, AGNP-C (06/25/2015 4:45 PM)

## 2015-07-14 ENCOUNTER — Encounter (HOSPITAL_COMMUNITY)
Admission: RE | Disposition: A | Discharge status: Home / Self Care | Payer: Self-pay | Source: Ambulatory Visit | Attending: Cardiology

## 2015-07-14 ENCOUNTER — Ambulatory Visit (HOSPITAL_COMMUNITY): Payer: BLUE CROSS/BLUE SHIELD

## 2015-07-14 ENCOUNTER — Ambulatory Visit (HOSPITAL_COMMUNITY)
Admission: RE | Admit: 2015-07-14 | Discharge: 2015-07-14 | Disposition: A | Discharge status: Home / Self Care | Payer: BLUE CROSS/BLUE SHIELD | Source: Ambulatory Visit | Attending: Cardiology | Admitting: Cardiology

## 2015-07-14 ENCOUNTER — Encounter (HOSPITAL_COMMUNITY): Payer: Self-pay | Admitting: Cardiology

## 2015-07-14 DIAGNOSIS — Z880 Allergy status to penicillin: Secondary | ICD-10-CM | POA: Insufficient documentation

## 2015-07-14 DIAGNOSIS — Q211 Atrial septal defect: Secondary | ICD-10-CM | POA: Diagnosis not present

## 2015-07-14 DIAGNOSIS — E785 Hyperlipidemia, unspecified: Secondary | ICD-10-CM | POA: Diagnosis not present

## 2015-07-14 DIAGNOSIS — I2510 Atherosclerotic heart disease of native coronary artery without angina pectoris: Secondary | ICD-10-CM | POA: Diagnosis not present

## 2015-07-14 DIAGNOSIS — I6523 Occlusion and stenosis of bilateral carotid arteries: Secondary | ICD-10-CM | POA: Diagnosis not present

## 2015-07-14 DIAGNOSIS — Z87891 Personal history of nicotine dependence: Secondary | ICD-10-CM | POA: Diagnosis not present

## 2015-07-14 DIAGNOSIS — I10 Essential (primary) hypertension: Secondary | ICD-10-CM | POA: Insufficient documentation

## 2015-07-14 DIAGNOSIS — R0600 Dyspnea, unspecified: Secondary | ICD-10-CM | POA: Insufficient documentation

## 2015-07-14 HISTORY — PX: TEE WITHOUT CARDIOVERSION: SHX5443

## 2015-07-14 LAB — ECHO TEE
Peak grad: 29 mmHg
TV PEAK RV-RA GRADIENT: 32 cm/s

## 2015-07-14 SURGERY — ECHOCARDIOGRAM, TRANSESOPHAGEAL
Anesthesia: Moderate Sedation

## 2015-07-14 MED ORDER — DIPHENHYDRAMINE HCL 50 MG/ML IJ SOLN
INTRAMUSCULAR | Status: AC
  Filled 2015-07-14: qty 1

## 2015-07-14 MED ORDER — MIDAZOLAM HCL 5 MG/ML IJ SOLN
INTRAMUSCULAR | Status: AC
  Filled 2015-07-14: qty 2

## 2015-07-14 MED ORDER — SODIUM CHLORIDE 0.9 % IV SOLN
INTRAVENOUS | Status: DC
Start: 2015-07-14 — End: 2015-07-14

## 2015-07-14 MED ORDER — MIDAZOLAM HCL 10 MG/2ML IJ SOLN
INTRAMUSCULAR | Status: DC | PRN
  Administered 2015-07-14: 1 mg via INTRAVENOUS
  Administered 2015-07-14: 2 mg via INTRAVENOUS

## 2015-07-14 MED ORDER — FENTANYL CITRATE (PF) 100 MCG/2ML IJ SOLN
INTRAMUSCULAR | Status: AC
  Filled 2015-07-14: qty 2

## 2015-07-14 MED ORDER — FENTANYL CITRATE (PF) 100 MCG/2ML IJ SOLN
INTRAMUSCULAR | Status: DC | PRN
  Administered 2015-07-14: 25 ug via INTRAVENOUS

## 2015-07-14 MED ORDER — BUTAMBEN-TETRACAINE-BENZOCAINE 2-2-14 % EX AERO
INHALATION_SPRAY | CUTANEOUS | Status: DC | PRN
  Administered 2015-07-14: 2 via TOPICAL

## 2015-07-14 NOTE — Discharge Instructions (Signed)

## 2015-07-14 NOTE — Progress Notes (Signed)
  Echocardiogram Echocardiogram Transesophageal has been performed.  Carol Wilkins M 07/14/2015, 12:35 PM

## 2015-07-14 NOTE — Interval H&P Note (Signed)
History and Physical Interval Note:  07/14/2015 11:59 AM  Carol Wilkins  has presented today for surgery, with the diagnosis of SHORTNESS OF BREATH, ASD FOR ATRIAL SEPTAL DEFECT  The various methods of treatment have been discussed with the patient and family. After consideration of risks, benefits and other options for treatment, the patient has consented to  Procedure(s): TRANSESOPHAGEAL ECHOCARDIOGRAM (TEE) (N/A) as a surgical intervention .  The patient's history has been reviewed, patient examined, no change in status, stable for surgery.  I have reviewed the patient's chart and labs.  Questions were answered to the patient's satisfaction.     Adrian Prows

## 2015-11-11 ENCOUNTER — Other Ambulatory Visit: Payer: Self-pay | Admitting: Family Medicine

## 2015-11-11 DIAGNOSIS — Z1231 Encounter for screening mammogram for malignant neoplasm of breast: Secondary | ICD-10-CM

## 2015-11-26 ENCOUNTER — Ambulatory Visit: Payer: BLUE CROSS/BLUE SHIELD

## 2015-11-30 ENCOUNTER — Ambulatory Visit
Admission: RE | Admit: 2015-11-30 | Discharge: 2015-11-30 | Disposition: A | Discharge status: Home / Self Care | Payer: BLUE CROSS/BLUE SHIELD | Source: Ambulatory Visit | Attending: Family Medicine | Admitting: Family Medicine

## 2015-11-30 DIAGNOSIS — Z1231 Encounter for screening mammogram for malignant neoplasm of breast: Secondary | ICD-10-CM

## 2016-05-25 ENCOUNTER — Ambulatory Visit (HOSPITAL_COMMUNITY)
Admission: EM | Admit: 2016-05-25 | Discharge: 2016-05-25 | Disposition: A | Discharge status: Home / Self Care | Payer: BLUE CROSS/BLUE SHIELD | Attending: Family Medicine | Admitting: Family Medicine

## 2016-05-25 ENCOUNTER — Encounter (HOSPITAL_COMMUNITY): Payer: Self-pay | Admitting: Emergency Medicine

## 2016-05-25 DIAGNOSIS — M5441 Lumbago with sciatica, right side: Secondary | ICD-10-CM

## 2016-05-25 MED ORDER — KETOROLAC TROMETHAMINE 30 MG/ML IJ SOLN
INTRAMUSCULAR | Status: AC
  Filled 2016-05-25: qty 1

## 2016-05-25 MED ORDER — KETOROLAC TROMETHAMINE 30 MG/ML IJ SOLN
30.0000 mg | Freq: Once | INTRAMUSCULAR | Status: AC
  Administered 2016-05-25: 30 mg via INTRAMUSCULAR

## 2016-05-25 MED ORDER — CYCLOBENZAPRINE HCL 10 MG PO TABS: 10.0000 mg | ORAL_TABLET | Freq: Two times a day (BID) | ORAL | 0 refills | Status: DC | PRN

## 2016-05-25 MED ORDER — DICLOFENAC SODIUM 75 MG PO TBEC: 75.0000 mg | DELAYED_RELEASE_TABLET | Freq: Two times a day (BID) | ORAL | 0 refills | Status: DC

## 2016-05-25 NOTE — ED Triage Notes (Signed)
The patient presented to the [facility] with a complaint of low back pain that radiates down her right leg x 2 days. The patient denied any known injury.

## 2016-05-25 NOTE — Discharge Instructions (Signed)
You most likely have sciatica in your lower back. You have been given a toradol injection in clinic today. I have prescribed two medicines for your pain. The first is diclofenac, take 1 tablet twice a day and the other is Flexeril, take 1 tablet twice a day. Flexeril may cause drowsiness so do not drive until you know how this medicine affects you. Also do not drink any alcohol either. You may apply ice and alternate with heat for 15 minutes at a time 4 times daily and for additional pain control you may take tylenol over the counter ever 4 hours but do not take more than 4000 mg a day. Should your pain continue or fail to resolve, follow up with your primary care provider or return to clinic as needed.

## 2016-05-25 NOTE — ED Provider Notes (Signed)
CSN: [id number]     Arrival date & time 05/25/16  1703 History   First MD Initiated Contact with Patient 05/25/16 1758     Chief Complaint  Patient presents with  . Back Pain   (Consider location/radiation/quality/duration/timing/severity/associated sxs/prior Treatment) 61 year old female presents to clinic with chief complaint of low back pain. She has history of chronic back pain, seen in the ER for similar condition last year. Current pain has been on going for four days, she denies traumatic cause, pain is radiating down the right leg with a burning sensation. She denies loss of sensation in her extremities or other red flag signs.    The history is provided by the patient.  Back Pain    Past Medical History:  Diagnosis Date  . Anxiety attack   . Hypertension    Past Surgical History:  Procedure Laterality Date  . ABDOMINAL HYSTERECTOMY     in early 90's  . CHOLECYSTECTOMY     in the 90's  . TEE WITHOUT CARDIOVERSION N/A 07/14/2015   Procedure: TRANSESOPHAGEAL ECHOCARDIOGRAM (TEE);  Surgeon: Adrian Prows, MD;  Location: [facility];  Service: Cardiovascular;  Laterality: N/A;  . TUBAL LIGATION    . TUBAL LIGATION  1980   Family History  Problem Relation Age of Onset  . Heart disease Mother   . Diabetes Mother   . Hypertension Mother   . Diabetes Father   . Hypertension Father   . Diabetes Sister   . Hypertension Sister   . Heart disease Sister   . Diabetes Brother   . Hypertension Brother   . Heart disease Brother    Social History  Substance Use Topics  . Smoking status: Former Smoker    Quit date: 09/13/1980  . Smokeless tobacco: Not on file  . Alcohol use No   OB History    No data available     Review of Systems  Reason unable to perform ROS: as covered in HPI.  Musculoskeletal: Positive for back pain.  All other systems reviewed and are negative.   Allergies  Penicillins  Home Medications   Prior to Admission medications   Medication Sig Start  Date End Date Taking? Authorizing Provider  amLODipine (NORVASC) 5 MG tablet Take 5 mg by mouth daily.   Yes Historical Provider, MD  aspirin EC 81 MG tablet Take 81 mg by mouth daily.   Yes Historical Provider, MD  atorvastatin (LIPITOR) 20 MG tablet Take 20 mg by mouth daily.   Yes Historical Provider, MD  Azilsartan Medoxomil (EDARBI) 40 MG TABS Take 40 mg by mouth daily.   Yes Historical Provider, MD  hydrochlorothiazide (MICROZIDE) 12.5 MG capsule Take 12.5 mg by mouth daily. 05/25/14  Yes Historical Provider, MD  cyclobenzaprine (FLEXERIL) 10 MG tablet Take 1 tablet (10 mg total) by mouth 2 (two) times daily as needed for muscle spasms. 05/25/16   Barnet Glasgow, NP  diclofenac (VOLTAREN) 75 MG EC tablet Take 1 tablet (75 mg total) by mouth 2 (two) times daily. 05/25/16   Barnet Glasgow, NP   Meds Ordered and Administered this Visit   Medications  ketorolac (TORADOL) 30 MG/ML injection 30 mg (not administered)    BP 186/68 (BP Location: Left Arm)   Pulse 64   Temp 97.6 F (36.4 C) (Oral)   Resp 16   SpO2 100%  No data found.   Physical Exam  Constitutional: She is oriented to person, place, and time. She appears well-developed and well-nourished. No distress.  HENT:  Head: Normocephalic and atraumatic.  Neck: Normal range of motion.  Cardiovascular: Normal rate and regular rhythm.   Pulmonary/Chest: Effort normal and breath sounds normal.  Musculoskeletal:       Back:  Positive right straight leg raise  Neurological: She is alert and oriented to person, place, and time.  Skin: Skin is warm and dry. Capillary refill takes less than 2 seconds. She is not diaphoretic.  Psychiatric: She has a normal mood and affect.  Nursing note and vitals reviewed.   Urgent Care Course     Procedures (including critical care time)  Labs Review Labs Reviewed - No data to display  Imaging Review No results found.   Visual Acuity Review  Right Eye Distance:   Left Eye Distance:    Bilateral Distance:    Right Eye Near:   Left Eye Near:    Bilateral Near:         MDM   1. Acute right-sided low back pain with right-sided sciatica   You most likely have sciatica in your lower back. You have been given a toradol injection in clinic today. I have prescribed two medicines for your pain. The first is diclofenac, take 1 tablet twice a day and the other is Flexeril, take 1 tablet twice a day. Flexeril may cause drowsiness so do not drive until you know how this medicine affects you. Also do not drink any alcohol either. You may apply ice and alternate with heat for 15 minutes at a time 4 times daily and for additional pain control you may take tylenol over the counter ever 4 hours but do not take more than 4000 mg a day. Should your pain continue or fail to resolve, follow up with your primary care provider or return to clinic as needed.       Barnet Glasgow, NP 05/25/16 [phone]

## 2016-11-23 ENCOUNTER — Other Ambulatory Visit: Payer: Self-pay | Admitting: Family Medicine

## 2016-11-23 DIAGNOSIS — Z1231 Encounter for screening mammogram for malignant neoplasm of breast: Secondary | ICD-10-CM

## 2016-12-05 ENCOUNTER — Ambulatory Visit: Payer: BLUE CROSS/BLUE SHIELD

## 2016-12-26 ENCOUNTER — Ambulatory Visit
Admission: RE | Admit: 2016-12-26 | Discharge: 2016-12-26 | Disposition: A | Discharge status: Home / Self Care | Payer: BLUE CROSS/BLUE SHIELD | Source: Ambulatory Visit | Attending: Family Medicine | Admitting: Family Medicine

## 2016-12-26 DIAGNOSIS — Z1231 Encounter for screening mammogram for malignant neoplasm of breast: Secondary | ICD-10-CM

## 2017-02-07 ENCOUNTER — Encounter (HOSPITAL_COMMUNITY): Payer: Self-pay

## 2017-02-07 ENCOUNTER — Emergency Department (HOSPITAL_COMMUNITY)
Admission: EM | Admit: 2017-02-07 | Discharge: 2017-02-08 | Disposition: A | Discharge status: Home / Self Care | Payer: BLUE CROSS/BLUE SHIELD | Attending: Emergency Medicine | Admitting: Emergency Medicine

## 2017-02-07 ENCOUNTER — Emergency Department (HOSPITAL_COMMUNITY): Payer: BLUE CROSS/BLUE SHIELD

## 2017-02-07 DIAGNOSIS — Z87891 Personal history of nicotine dependence: Secondary | ICD-10-CM | POA: Insufficient documentation

## 2017-02-07 DIAGNOSIS — Z7982 Long term (current) use of aspirin: Secondary | ICD-10-CM | POA: Insufficient documentation

## 2017-02-07 DIAGNOSIS — R109 Unspecified abdominal pain: Secondary | ICD-10-CM | POA: Diagnosis not present

## 2017-02-07 DIAGNOSIS — I10 Essential (primary) hypertension: Secondary | ICD-10-CM | POA: Diagnosis not present

## 2017-02-07 DIAGNOSIS — Z79899 Other long term (current) drug therapy: Secondary | ICD-10-CM | POA: Diagnosis not present

## 2017-02-07 LAB — URINALYSIS, ROUTINE W REFLEX MICROSCOPIC
Bilirubin Urine: NEGATIVE
GLUCOSE, UA: NEGATIVE mg/dL
Hgb urine dipstick: NEGATIVE
KETONES UR: NEGATIVE mg/dL
LEUKOCYTES UA: NEGATIVE
NITRITE: NEGATIVE
PH: 5 (ref 5.0–8.0)
PROTEIN: NEGATIVE mg/dL
Specific Gravity, Urine: 1.015 (ref 1.005–1.030)

## 2017-02-07 LAB — COMPREHENSIVE METABOLIC PANEL
ALT: 40 U/L (ref 14–54)
ANION GAP: 9 (ref 5–15)
AST: 29 U/L (ref 15–41)
Albumin: 3.9 g/dL (ref 3.5–5.0)
Alkaline Phosphatase: 69 U/L (ref 38–126)
BILIRUBIN TOTAL: 0.6 mg/dL (ref 0.3–1.2)
BUN: 21 mg/dL — ABNORMAL HIGH (ref 6–20)
CHLORIDE: 105 mmol/L (ref 101–111)
CO2: 24 mmol/L (ref 22–32)
Calcium: 9.5 mg/dL (ref 8.9–10.3)
Creatinine, Ser: 0.96 mg/dL (ref 0.44–1.00)
Glucose, Bld: 100 mg/dL — ABNORMAL HIGH (ref 65–99)
POTASSIUM: 3.7 mmol/L (ref 3.5–5.1)
Sodium: 138 mmol/L (ref 135–145)
TOTAL PROTEIN: 7.4 g/dL (ref 6.5–8.1)

## 2017-02-07 LAB — CBC
HEMATOCRIT: 35.2 % — AB (ref 36.0–46.0)
Hemoglobin: 11.1 g/dL — ABNORMAL LOW (ref 12.0–15.0)
MCH: 26.6 pg (ref 26.0–34.0)
MCHC: 31.5 g/dL (ref 30.0–36.0)
MCV: 84.4 fL (ref 78.0–100.0)
Platelets: 225 10*3/uL (ref 150–400)
RBC: 4.17 MIL/uL (ref 3.87–5.11)
RDW: 16.1 % — ABNORMAL HIGH (ref 11.5–15.5)
WBC: 5.8 10*3/uL (ref 4.0–10.5)

## 2017-02-07 LAB — LIPASE, BLOOD: LIPASE: 32 U/L (ref 11–51)

## 2017-02-07 NOTE — ED Triage Notes (Signed)
PT reports right flank and RLQ pain that is worse with activity x several weeks. Pt denies urinary symptoms. Endorses nausea and dizziness at times with pain

## 2017-02-07 NOTE — ED Provider Notes (Signed)
[facility] EMERGENCY DEPARTMENT Provider Note   CSN: [id number] Arrival date & time: 02/07/17  1706     History   Chief Complaint Chief Complaint  Patient presents with  . Flank Pain  . Abdominal Pain    HPI ONEDA DUFFETT is a 61 y.o. female.  The history is provided by the patient and medical records.  Flank Pain  Associated symptoms include abdominal pain.  Abdominal Pain      61 y.o. F with hx of anxiety, HTN, depression, low back pain, presenting to the ED for right flank/abdominal pain that has been ongoing for 2+ weeks now.  States it starts in the right flank and occasionally radiates to right lower abdomen.  States pain seems to come and go, worse with exertional activity and movement.  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  She denies any known injury though.  No fever, chills.  Some nausea and lightheadedness when pain is severe.  No radiation of pain into the legs.  No bowel or bladder incontinence.  No numbness/weakness of the legs.  No urinary symptoms.  BM's normal.  Has been eating/drinking well.  Past Medical History:  Diagnosis Date  . Anxiety attack   . Hypertension     Patient Active Problem List   Diagnosis Date Noted  . Low back pain 01/25/2012  . Abdominal pain 11/02/2011  . Obesity (BMI 30.0-34.9) 10/31/2011  . Post herpetic neuralgia 09/14/2011  . DEPRESSION 12/22/2006  . HYPERTENSION 12/22/2006  . ALLERGIC RHINITIS 12/22/2006  . PALPITATIONS, HX OF 12/22/2006    Past Surgical History:  Procedure Laterality Date  . ABDOMINAL HYSTERECTOMY     in early 90's  . CHOLECYSTECTOMY     in the 90's  . TEE WITHOUT CARDIOVERSION N/A 07/14/2015   Procedure: TRANSESOPHAGEAL ECHOCARDIOGRAM (TEE);  Surgeon: Adrian Prows, MD;  Location: [facility];  Service: Cardiovascular;  Laterality: N/A;  . TUBAL LIGATION    . TUBAL LIGATION  1980    OB  History    No data available       Home Medications    Prior to Admission medications   Medication Sig Start Date End Date Taking? Authorizing Provider  amLODipine (NORVASC) 5 MG tablet Take 5 mg by mouth daily.   Yes [provider]  aspirin EC 81 MG tablet Take 81 mg by mouth daily.   Yes [provider]  Azilsartan Medoxomil (EDARBI) 40 MG TABS Take 40 mg by mouth daily.   Yes [provider]  hydrochlorothiazide (MICROZIDE) 12.5 MG capsule Take 12.5 mg by mouth daily. 05/25/14  Yes [provider]  cyclobenzaprine (FLEXERIL) 10 MG tablet Take 1 tablet (10 mg total) by mouth 2 (two) times daily as needed for muscle spasms. Patient not taking: Reported on 02/07/2017 05/25/16   Barnet Glasgow, NP  diclofenac (VOLTAREN) 75 MG EC tablet Take 1 tablet (75 mg total) by mouth 2 (two) times daily. Patient not taking: Reported on 02/07/2017 05/25/16   Barnet Glasgow, NP    Family History Family History  Problem Relation Age of Onset  . Heart disease Mother   . Diabetes Mother   . Hypertension Mother   . Diabetes Father   . Hypertension Father   . Diabetes Sister   . Hypertension Sister   . Heart disease Sister   . Diabetes Brother   . Hypertension  Brother   . Heart disease Brother     Social History Social History  Substance Use Topics  . Smoking status: Former Smoker    Quit date: 09/13/1980  . Smokeless tobacco: Never Used  . Alcohol use No     Allergies   Penicillins   Review of Systems Review of Systems  Gastrointestinal: Positive for abdominal pain.  Genitourinary: Positive for flank pain.  All other systems reviewed and are negative.    Physical Exam Updated Vital Signs BP (!) 162/78   Pulse 65   Temp 97.6 F (36.4 C) (Oral)   Resp 18   Ht 5\' 3"  (1.6 m)   Wt 83.9 kg (185 lb)   SpO2 100%   BMI 32.77 kg/m   Physical Exam  Constitutional: She is oriented to person, place, and time. She appears well-developed and  well-nourished.  HENT:  Head: Normocephalic and atraumatic.  Mouth/Throat: Oropharynx is clear and moist.  Eyes: Pupils are equal, round, and reactive to light. Conjunctivae and EOM are normal.  Neck: Normal range of motion.  Cardiovascular: Normal rate, regular rhythm and normal heart sounds.   Pulmonary/Chest: Effort normal and breath sounds normal. No respiratory distress. She has no wheezes.  Abdominal: Soft. Bowel sounds are normal. There is no tenderness. There is no rebound.  Endorses pain of right flank but no focal CVA tenderness  Musculoskeletal: Normal range of motion.  No midline spinal tenderness, no deformities or step-off; - SLR bilaterally, normal sensation and strength of both legs, normal gait  Neurological: She is alert and oriented to person, place, and time.  Skin: Skin is warm and dry.  Psychiatric: She has a normal mood and affect.  Nursing note and vitals reviewed.    ED Treatments / Results  Labs (all labs ordered are listed, but only abnormal results are displayed) Labs Reviewed  COMPREHENSIVE METABOLIC PANEL - Abnormal; Notable for the following:       Result Value   Glucose, Bld 100 (*)    BUN 21 (*)    All other components within normal limits  CBC - Abnormal; Notable for the following:    Hemoglobin 11.1 (*)    HCT 35.2 (*)    RDW 16.1 (*)    All other components within normal limits  LIPASE, BLOOD  URINALYSIS, ROUTINE W REFLEX MICROSCOPIC    EKG  EKG Interpretation None       Radiology Ct Renal Stone Study  Result Date: 02/07/2017 CLINICAL DATA:  Initial evaluation for acute right lower quadrant pain and nausea. EXAM: CT ABDOMEN AND PELVIS WITHOUT CONTRAST TECHNIQUE: Multidetector CT imaging of the abdomen and pelvis was performed following the standard protocol without IV contrast. COMPARISON:  Prior CT from [facility]. FINDINGS: Lower chest: Mild scattered bibasilar atelectatic changes. Visualized lung bases are otherwise clear.  Hepatobiliary: Liver demonstrates a normal unenhanced appearance. Gallbladder surgically absent. No biliary dilatation. Pancreas: Pancreas within normal limits. Spleen: Spleen within normal limits. Adrenals/Urinary Tract: Adrenal glands are normal. Kidneys equal in size without evidence for nephrolithiasis or hydronephrosis. No radiopaque calculi seen along the course of either renal collecting system. No hydroureter. Partially distended bladder within normal limits. No layering stones within the bladder lumen. Stomach/Bowel: Stomach within normal limits. No evidence for bowel obstruction. Appendix is normal. Colonic diverticulosis without evidence for acute diverticulitis. No acute inflammatory changes seen about the bowels. Vascular/Lymphatic: Moderate aorto bi-iliac atherosclerotic disease. No aneurysm. No adenopathy. Reproductive: Uterus is absent.  Ovaries within normal limits. Other: No free  air or fluid. Small fat containing paraumbilical hernia noted. Musculoskeletal: No acute osseus abnormality. No worrisome lytic or blastic osseous lesions. Trace anterolisthesis of L3 on L4 and L4 on L5 with advanced facet arthropathy. IMPRESSION: 1. No CT evidence for nephrolithiasis or obstructive uropathy. 2. No other acute intra-abdominal or pelvic process. 3. Normal appendix. 4. Colonic diverticulosis without evidence for acute diverticulitis. Electronically Signed   By: Jeannine Boga M.D.   On: 02/07/2017 23:23    Procedures Procedures (including critical care time)  Medications Ordered in ED Medications - No data to display   Initial Impression / Assessment and Plan / ED Course  I have reviewed the triage vital signs and the nursing notes.  Pertinent labs & imaging results that were available during my care of the patient were reviewed by me and considered in my medical decision making (see chart for details).  61 year old female here with right flank pain for the past 2 weeks-- some occasional  radiation to RLQ. She denies any injury or trauma. 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 Denies any urinary symptoms. Lab work overall reassuring. UA without signs of infection.  CT renal study obtained without acute findings.  Does have some anterolisthesis of L3-L4 and L4-L5 on exam which may be contributing to her symptoms. No neurologic deficits or red flag symptoms here to suggest cauda equina or other emergent process. Will treat with short course pain meds, NSAIDs.  Close follow-up with PCP.  Discussed plan with patient, she acknowledged understanding and agreed with plan of care.  Return precautions given for new or worsening symptoms.  Final Clinical Impressions(s) / ED Diagnoses   Final diagnoses:  Right flank pain    New Prescriptions Discharge Medication List as of 02/08/2017  1:00 AM    START taking these medications   Details  HYDROcodone-acetaminophen (NORCO/VICODIN) 5-325 MG tablet Take 1 tablet by mouth every 4 (four) hours as needed., Starting Wed 02/08/2017, Print    meloxicam (MOBIC) 7.5 MG tablet Take 2 tablets (15 mg total) by mouth daily., Starting Wed 02/08/2017, Print         Larene Pickett, PA-C 02/08/17 0881    Daleen Bo, MD 02/08/17 [phone]

## 2017-02-07 NOTE — ED Notes (Signed)
Patient transported to CT 

## 2017-02-08 MED ORDER — NAPROXEN 250 MG PO TABS
500.0000 mg | ORAL_TABLET | Freq: Once | ORAL | Status: AC
  Administered 2017-02-08: 500 mg via ORAL
  Filled 2017-02-08: qty 2

## 2017-02-08 MED ORDER — MELOXICAM 7.5 MG PO TABS: 15.0000 mg | ORAL_TABLET | Freq: Every day | ORAL | 0 refills | Status: DC

## 2017-02-08 MED ORDER — HYDROCODONE-ACETAMINOPHEN 5-325 MG PO TABS: 1.0000 | ORAL_TABLET | ORAL | 0 refills | Status: DC | PRN

## 2017-02-08 NOTE — Discharge Instructions (Signed)
Take the prescribed medication as directed. °Follow-up with your primary care doctor if any ongoing issues. °Return to the ED for new or worsening symptoms. °

## 2017-03-16 ENCOUNTER — Other Ambulatory Visit: Payer: Self-pay

## 2017-03-16 ENCOUNTER — Emergency Department (HOSPITAL_COMMUNITY): Payer: BLUE CROSS/BLUE SHIELD

## 2017-03-16 ENCOUNTER — Encounter (HOSPITAL_COMMUNITY): Payer: Self-pay | Admitting: Emergency Medicine

## 2017-03-16 ENCOUNTER — Emergency Department (HOSPITAL_COMMUNITY)
Admission: EM | Admit: 2017-03-16 | Discharge: 2017-03-16 | Disposition: A | Discharge status: Home / Self Care | Payer: BLUE CROSS/BLUE SHIELD | Attending: Emergency Medicine | Admitting: Emergency Medicine

## 2017-03-16 DIAGNOSIS — I10 Essential (primary) hypertension: Secondary | ICD-10-CM | POA: Insufficient documentation

## 2017-03-16 DIAGNOSIS — Z87891 Personal history of nicotine dependence: Secondary | ICD-10-CM | POA: Diagnosis not present

## 2017-03-16 DIAGNOSIS — Z7982 Long term (current) use of aspirin: Secondary | ICD-10-CM | POA: Insufficient documentation

## 2017-03-16 DIAGNOSIS — X58XXXA Exposure to other specified factors, initial encounter: Secondary | ICD-10-CM | POA: Diagnosis not present

## 2017-03-16 DIAGNOSIS — S39012D Strain of muscle, fascia and tendon of lower back, subsequent encounter: Secondary | ICD-10-CM | POA: Diagnosis not present

## 2017-03-16 DIAGNOSIS — R1030 Lower abdominal pain, unspecified: Secondary | ICD-10-CM | POA: Diagnosis present

## 2017-03-16 LAB — COMPREHENSIVE METABOLIC PANEL
ALBUMIN: 3.8 g/dL (ref 3.5–5.0)
ALT: 19 U/L (ref 14–54)
ANION GAP: 9 (ref 5–15)
AST: 21 U/L (ref 15–41)
Alkaline Phosphatase: 80 U/L (ref 38–126)
BILIRUBIN TOTAL: 0.5 mg/dL (ref 0.3–1.2)
BUN: 11 mg/dL (ref 6–20)
CHLORIDE: 103 mmol/L (ref 101–111)
CO2: 28 mmol/L (ref 22–32)
Calcium: 9.8 mg/dL (ref 8.9–10.3)
Creatinine, Ser: 0.87 mg/dL (ref 0.44–1.00)
GFR calc Af Amer: >60 mL/min (ref >60)
GFR calc non Af Amer: >60 mL/min (ref >60)
Glucose, Bld: 147 mg/dL — ABNORMAL HIGH (ref 65–99)
POTASSIUM: 3.6 mmol/L (ref 3.5–5.1)
SODIUM: 140 mmol/L (ref 135–145)
TOTAL PROTEIN: 7.4 g/dL (ref 6.5–8.1)

## 2017-03-16 LAB — CBC
HCT: 37.5 % (ref 36.0–46.0)
Hemoglobin: 11.9 g/dL — ABNORMAL LOW (ref 12.0–15.0)
MCH: 27.2 pg (ref 26.0–34.0)
MCHC: 31.7 g/dL (ref 30.0–36.0)
MCV: 85.8 fL (ref 78.0–100.0)
PLATELETS: 234 10*3/uL (ref 150–400)
RBC: 4.37 MIL/uL (ref 3.87–5.11)
RDW: 15.5 % (ref 11.5–15.5)
WBC: 4 10*3/uL (ref 4.0–10.5)

## 2017-03-16 LAB — URINALYSIS, ROUTINE W REFLEX MICROSCOPIC
Bilirubin Urine: NEGATIVE
GLUCOSE, UA: NEGATIVE mg/dL
HGB URINE DIPSTICK: NEGATIVE
Ketones, ur: NEGATIVE mg/dL
Leukocytes, UA: NEGATIVE
Nitrite: NEGATIVE
PH: 7 (ref 5.0–8.0)
PROTEIN: NEGATIVE mg/dL
Specific Gravity, Urine: 1.014 (ref 1.005–1.030)

## 2017-03-16 LAB — LIPASE, BLOOD: LIPASE: 22 U/L (ref 11–51)

## 2017-03-16 MED ORDER — LIDOCAINE 5 % EX PTCH: 1.0000 | MEDICATED_PATCH | CUTANEOUS | 0 refills | Status: DC

## 2017-03-16 MED ORDER — KETOROLAC TROMETHAMINE 30 MG/ML IJ SOLN
15.0000 mg | Freq: Once | INTRAMUSCULAR | Status: AC
  Administered 2017-03-16: 15 mg via INTRAVENOUS
  Filled 2017-03-16: qty 1

## 2017-03-16 MED ORDER — IOPAMIDOL (ISOVUE-300) INJECTION 61%
INTRAVENOUS | Status: AC
  Administered 2017-03-16: 100 mL via INTRAVENOUS
  Filled 2017-03-16: qty 100

## 2017-03-16 MED ORDER — IOPAMIDOL (ISOVUE-300) INJECTION 61%
INTRAVENOUS | Status: AC
  Filled 2017-03-16: qty 100

## 2017-03-16 NOTE — Discharge Instructions (Signed)
Please read attached information regarding back stretches. Use lidocaine patches as directed. Take Flexeril and anti-inflammatories as needed. Follow-up with your primary care provider for further evaluation. Return to ED for worsening pain, numbness injuries, falls, loss of bladder function, severe abdominal pain.

## 2017-03-16 NOTE — ED Provider Notes (Signed)
[facility] DEPT Provider Note   CSN: [id number] Arrival date & time: 03/16/17  1235     History   Chief Complaint Chief Complaint  Patient presents with  . Abdominal Pain  . Back Pain    HPI Carol Wilkins is a 61 y.o. female with a past medical history of hypertension, who presents to ED for evaluation of right flank pain radiating to right lower quadrant for the past 5 weeks.  She was seen and evaluated here in the ED when symptoms began approximately 1 month ago.  Lab work and CT renal stone study were done which were unremarkable.  Her symptoms were attributed to a pulled muscle.  She has been taking Flexeril and tramadol with no relief in her symptoms.  She states that she is concerned because she is unable to go to work for the past 2 weeks because she has any type of pain with movement.  She states that "something just does not feel right."  States that the pain began in her right back/flank area and is now radiating to her right lower quadrant.  She has not tried any heat or stretching.  She denies any urinary symptoms, bowel changes, nausea, vomiting, numbness in legs, prior back surgeries, history of cancer, history of IV drug use, injuries or falls.  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 She did have a cholecystectomy done several years ago.  HPI  Past Medical History:  Diagnosis Date  . Anxiety attack   . Hypertension     Patient Active Problem List   Diagnosis Date Noted  . Low back pain 01/25/2012  . Abdominal pain 11/02/2011  . Obesity (BMI 30.0-34.9) 10/31/2011  . Post herpetic neuralgia 09/14/2011  . DEPRESSION 12/22/2006  . HYPERTENSION 12/22/2006  . ALLERGIC RHINITIS 12/22/2006  . PALPITATIONS, HX OF 12/22/2006    Past Surgical History:  Procedure Laterality Date  . ABDOMINAL HYSTERECTOMY     in early 90's  . CHOLECYSTECTOMY     in the 90's  . TEE WITHOUT CARDIOVERSION N/A 07/14/2015   Procedure:  TRANSESOPHAGEAL ECHOCARDIOGRAM (TEE);  Surgeon: Adrian Prows, MD;  Location: [facility];  Service: Cardiovascular;  Laterality: N/A;  . TUBAL LIGATION    . TUBAL LIGATION  1980    OB History    No data available       Home Medications    Prior to Admission medications   Medication Sig Start Date End Date Taking? Authorizing Provider  amLODipine (NORVASC) 10 MG tablet Take 10 mg by mouth daily. 01/25/17  Yes [provider]  aspirin EC 81 MG tablet Take 81 mg by mouth daily.   Yes [provider]  Azilsartan Medoxomil (EDARBI) 40 MG TABS Take 40 mg by mouth daily.   Yes [provider]  hydrochlorothiazide (HYDRODIURIL) 25 MG tablet Take 25 mg by mouth daily.  02/23/17  Yes [provider]  traMADol (ULTRAM) 50 MG tablet Take 1 tablet by mouth every 8 hours for BACK PAIN 03/08/17  Yes [provider]  amLODipine (NORVASC) 5 MG tablet Take 5 mg by mouth daily.    [provider]  cyclobenzaprine (FLEXERIL) 10 MG tablet Take 1 tablet (10 mg total) by mouth 2 (two) times daily as needed for muscle spasms. Patient not taking: Reported on 02/07/2017 05/25/16   Barnet Glasgow, NP  cyclobenzaprine (FLEXERIL) 5 MG tablet Take 5 mg by mouth at bedtime. 03/07/17   [provider]  diclofenac (VOLTAREN) 75 MG EC tablet Take 1 tablet (75 mg total) by mouth 2 (two) times daily. Patient not taking: Reported on 02/07/2017 05/25/16   Barnet Glasgow, NP  hydrochlorothiazide (MICROZIDE) 12.5 MG capsule Take 12.5 mg by mouth daily. 05/25/14   [provider]  HYDROcodone-acetaminophen (NORCO/VICODIN) 5-325 MG tablet Take 1 tablet by mouth every 4 (four) hours as needed. Patient not taking: Reported on 03/16/2017 02/08/17   Larene Pickett, PA-C  lidocaine (LIDODERM) 5 % Place 1 patch onto the skin daily. Remove & Discard patch within 12 hours or as directed by MD 03/16/17   Delia Heady, PA-C  meloxicam (MOBIC) 7.5 MG tablet Take 2  tablets (15 mg total) by mouth daily. Patient not taking: Reported on 03/16/2017 02/08/17   Larene Pickett, PA-C  predniSONE (STERAPRED UNI-PAK 48 TAB) 5 MG (48) TBPK tablet DIRECTIONS ARE ON BACK OF PACKAGE 02/21/17   [provider]    Family History Family History  Problem Relation Age of Onset  . Heart disease Mother   . Diabetes Mother   . Hypertension Mother   . Diabetes Father   . Hypertension Father   . Diabetes Sister   . Hypertension Sister   . Heart disease Sister   . Diabetes Brother   . Hypertension Brother   . Heart disease Brother     Social History Social History   Tobacco Use  . Smoking status: Former Smoker    Last attempt to quit: 09/13/1980    Years since quitting: 36.5  . Smokeless tobacco: Never Used  Substance Use Topics  . Alcohol use: No  . Drug use: No     Allergies   Penicillins   Review of Systems Review of Systems  Constitutional: Negative for appetite change, chills and fever.  HENT: Negative for ear pain, rhinorrhea, sneezing and sore throat.   Eyes: Negative for photophobia and visual disturbance.  Respiratory: Negative for cough, chest tightness, shortness of breath and wheezing.   Cardiovascular: Negative for chest pain and palpitations.  Gastrointestinal: Negative for abdominal pain, blood in stool, constipation, diarrhea, nausea and vomiting.  Genitourinary: Positive for flank pain. Negative for dysuria, hematuria and urgency.  Musculoskeletal: Positive for back pain and myalgias. Negative for arthralgias.  Skin: Negative for rash.  Neurological: Negative for dizziness, weakness, light-headedness and numbness.     Physical Exam Updated Vital Signs BP (!) 153/94   Pulse 78   Temp 98.3 F (36.8 C) (Oral)   Resp 18   Ht 5\' 3"  (1.6 m)   Wt 83.9 kg (185 lb)   SpO2 94%   BMI 32.77 kg/m   Physical Exam  Constitutional: She appears well-developed and well-nourished. No distress.  HENT:  Head: Normocephalic and  atraumatic.  Nose: Nose normal.  Eyes: Conjunctivae and EOM are normal. Right eye exhibits no discharge. Left eye exhibits no discharge. No scleral icterus.  Neck: Normal range of motion. Neck supple.  Cardiovascular: Normal rate, regular rhythm, normal heart sounds and intact distal pulses. Exam reveals no gallop and no friction rub.  No murmur heard. Pulmonary/Chest: Effort normal and breath sounds normal. No respiratory distress.  Abdominal: Soft. Bowel sounds are normal. She exhibits no distension. There is no tenderness. There is no guarding.  No changes in pain with palpation of the flank area or right lower quadrant.  Musculoskeletal: Normal range of motion. She exhibits no edema.  No midline spinal tenderness present in lumbar, thoracic or cervical spine. No step-off palpated. No visible  bruising, edema or temperature change noted. No objective signs of numbness present. No saddle anesthesia. 2+ DP pulses bilaterally. Sensation intact to light touch. Strength 5/5 in bilateral lower extremities.  Neurological: She is alert. She exhibits normal muscle tone. Coordination normal.  Skin: Skin is warm and dry. No rash noted.  Psychiatric: She has a normal mood and affect.  Nursing note and vitals reviewed.    ED Treatments / Results  Labs (all labs ordered are listed, but only abnormal results are displayed) Labs Reviewed  COMPREHENSIVE METABOLIC PANEL - Abnormal; Notable for the following components:      Result Value   Glucose, Bld 147 (*)    All other components within normal limits  CBC - Abnormal; Notable for the following components:   Hemoglobin 11.9 (*)    All other components within normal limits  LIPASE, BLOOD  URINALYSIS, ROUTINE W REFLEX MICROSCOPIC    EKG  EKG Interpretation None       Radiology Ct Abdomen Pelvis W Contrast  Result Date: 03/16/2017 CLINICAL DATA:  Right lower quadrant pain for 3 weeks. Diverticulitis suspected. EXAM: CT ABDOMEN AND PELVIS  WITH CONTRAST TECHNIQUE: Multidetector CT imaging of the abdomen and pelvis was performed using the standard protocol following bolus administration of intravenous contrast. CONTRAST:  170mL ISOVUE-300 IOPAMIDOL (ISOVUE-300) INJECTION 61% COMPARISON:  02/07/2017 FINDINGS: Lower chest: Bibasilar scarring Normal heart size without pericardial or pleural effusion. Hepatobiliary: Normal liver. Cholecystectomy, without biliary ductal dilatation. Pancreas: Normal, without mass or ductal dilatation. Spleen: Normal in size, without focal abnormality. Adrenals/Urinary Tract: Normal adrenal glands. An upper pole left renal too small to characterize lesion. Normal right kidney, without hydronephrosis or hydroureter. Normal urinary bladder. Stomach/Bowel: Gastric antral underdistention. Scattered colonic diverticula. Normal terminal ileum and appendix. Normal small bowel. Vascular/Lymphatic: Advanced aortic and branch vessel atherosclerosis. No abdominopelvic adenopathy. Reproductive: Hysterectomy.  No adnexal mass. Other: No significant free fluid.  Mild pelvic floor laxity. Musculoskeletal: Right hip osteoarthritis. Lumbosacral spondylosis. L3-4 and L4-5 grade 1 anterolisthesis is likely secondary. Convex right lumbar spine curvature. IMPRESSION: 1.  No acute process in the abdomen or pelvis. 2.  Aortic Atherosclerosis (ICD10-I70.0). Electronically Signed   By: Abigail Miyamoto M.D.   On: 03/16/2017 21:58    Procedures Procedures (including critical care time)  Medications Ordered in ED Medications  iopamidol (ISOVUE-300) 61 % injection (not administered)  ketorolac (TORADOL) 30 MG/ML injection 15 mg (15 mg Intravenous Given 03/16/17 2116)  iopamidol (ISOVUE-300) 61 % injection (100 mLs Intravenous Contrast Given 03/16/17 2124)     Initial Impression / Assessment and Plan / ED Course  I have reviewed the triage vital signs and the nursing notes.  Pertinent labs & imaging results that were available during my care  of the patient were reviewed by me and considered in my medical decision making (see chart for details).     Patient presents to ED for evaluation of R flank/back pain radiating to right lower quadrant for the past 5 weeks.  She was seen and evaluated here in the ED when symptoms began approximately 1 month ago.  Her lab work and imaging studies were unremarkable.  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  She denies any associated symptoms including dysuria, loss of bladder function, bowel changes, fevers, history of kidney stones, history of prior back surgeries. On physical exam she is nontoxic appearing and in no acute distress.  She has no changes in pain with palpation of her flank or abdomen.  She has no focal deficits on her neurological exam.  Her abdominal lab work is unremarkable.  No signs of UTI.  CT of the abdomen and pelvis, which was done at patient's request due to "I want to figure out what is going on" show no acute process.  I suspect that her symptoms are due to a muscle strain.  Low suspicion for cauda equina or other acute spinal cord injury being the cause of her flank/back pain. Informed her that regardless of her medications, she does need to apply heating pad and complete stretches for quicker healing of her muscle.  We will give lidocaine patches as well and advised her to follow-up with her primary care provider for further evaluation.  Patient appears stable for discharge at this time.  Strict return precautions given.  Final Clinical Impressions(s) / ED Diagnoses   Final diagnoses:  Strain of lumbar region, subsequent encounter    ED Discharge Orders        Ordered    lidocaine (LIDODERM) 5 %  Every 24 hours     03/16/17 2222       Delia Heady, PA-C 03/16/17 2234    Lacretia Leigh, MD 03/20/17 [phone]

## 2017-03-16 NOTE — ED Triage Notes (Addendum)
Patient c/o right lower back pain radiating to RLQ x3 weeks. Seen for same in [facility] x3 weeks ago. Reports PCP dx with pulled muscle. Denies N/V/D and urinary sx. Ambulatory.

## 2017-09-05 ENCOUNTER — Ambulatory Visit: Payer: BLUE CROSS/BLUE SHIELD | Admitting: Registered"

## 2017-11-16 ENCOUNTER — Other Ambulatory Visit: Payer: Self-pay | Admitting: Family Medicine

## 2017-11-16 DIAGNOSIS — Z1231 Encounter for screening mammogram for malignant neoplasm of breast: Secondary | ICD-10-CM

## 2017-12-25 ENCOUNTER — Other Ambulatory Visit: Payer: Self-pay | Admitting: Family Medicine

## 2017-12-25 ENCOUNTER — Ambulatory Visit
Admission: RE | Admit: 2017-12-25 | Discharge: 2017-12-25 | Disposition: A | Discharge status: Home / Self Care | Payer: Medicaid Other | Source: Ambulatory Visit | Attending: Family Medicine | Admitting: Family Medicine

## 2017-12-25 DIAGNOSIS — M25571 Pain in right ankle and joints of right foot: Secondary | ICD-10-CM

## 2017-12-27 ENCOUNTER — Ambulatory Visit
Admission: RE | Admit: 2017-12-27 | Discharge: 2017-12-27 | Disposition: A | Discharge status: Home / Self Care | Payer: Medicaid Other | Source: Ambulatory Visit | Attending: Family Medicine | Admitting: Family Medicine

## 2017-12-27 DIAGNOSIS — Z1231 Encounter for screening mammogram for malignant neoplasm of breast: Secondary | ICD-10-CM

## 2018-03-21 ENCOUNTER — Encounter (HOSPITAL_COMMUNITY): Payer: Self-pay

## 2018-03-21 ENCOUNTER — Ambulatory Visit (HOSPITAL_COMMUNITY)
Admission: EM | Admit: 2018-03-21 | Discharge: 2018-03-21 | Disposition: A | Discharge status: Home / Self Care | Payer: Medicaid Other | Attending: Family Medicine | Admitting: Family Medicine

## 2018-03-21 DIAGNOSIS — R0789 Other chest pain: Secondary | ICD-10-CM | POA: Diagnosis not present

## 2018-03-21 MED ORDER — DICLOFENAC SODIUM 75 MG PO TBEC: 75.0000 mg | DELAYED_RELEASE_TABLET | Freq: Two times a day (BID) | ORAL | 0 refills | Status: DC

## 2018-03-21 NOTE — Discharge Instructions (Addendum)
Your abdomen is nontender.  Your lungs are clear.  He has no sign of a deep underlying problem at this point.  Because the pain is reproduced by lifting your arm overhead and because there is no tenderness in your abdomen, I believe you have strained a muscle on the chest wall which should respond to the medicine prescribed.

## 2018-03-21 NOTE — ED Triage Notes (Signed)
Pt presents with abdominal pain mostly in rib area and under her breast. Pt also complains of shortness of breath with exertion.

## 2018-03-21 NOTE — ED Provider Notes (Signed)
[place]    CSN: [id number] Arrival date & time: 03/21/18  1555     History   Chief Complaint Chief Complaint  Patient presents with  . Abdominal Pain    Rib Area  on Both Sides  . Shortness of Breath    With Exertion    HPI Carol Wilkins is a 62 y.o. female.   This is an established [facility] urgent care patient who presents with abdominal pain mostly in rib area and under her breast. Pt also complains of shortness of breath with exertion.  She also notes the pain when she raises her right arm above her head.  Patient has had a cholecystectomy.    Note from emergency department on 03/16/17: Carol Wilkins is a 62 y.o. female with a past medical history of hypertension, who presents to ED for evaluation of right flank pain radiating to right lower quadrant for the past 5 weeks.  She was seen and evaluated here in the ED when symptoms began approximately 1 month ago.  Lab work and CT renal stone study were done which were unremarkable.  Her symptoms were attributed to a pulled muscle.  She has been taking Flexeril and tramadol with no relief in her symptoms.  She states that she is concerned because she is unable to go to work for the past 2 weeks because she has any type of pain with movement.  She states that "something just does not feel right."  States that the pain began in her right back/flank area and is now radiating to her right lower quadrant.  She has not tried any heat or stretching.  She denies any urinary symptoms, bowel changes, nausea, vomiting, numbness in legs, prior back surgeries, history of cancer, history of IV drug use, injuries or falls.  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 She did have a cholecystectomy done several years ago.     Past Medical History:  Diagnosis Date  . Anxiety attack   . Hypertension     Patient Active Problem List   Diagnosis Date Noted  . Low back pain 01/25/2012  . Abdominal pain  11/02/2011  . Obesity (BMI 30.0-34.9) 10/31/2011  . Post herpetic neuralgia 09/14/2011  . DEPRESSION 12/22/2006  . HYPERTENSION 12/22/2006  . ALLERGIC RHINITIS 12/22/2006  . PALPITATIONS, HX OF 12/22/2006    Past Surgical History:  Procedure Laterality Date  . ABDOMINAL HYSTERECTOMY     in early 90's  . CHOLECYSTECTOMY     in the 90's  . TEE WITHOUT CARDIOVERSION N/A 07/14/2015   Procedure: TRANSESOPHAGEAL ECHOCARDIOGRAM (TEE);  Surgeon: Adrian Prows, MD;  Location: [facility];  Service: Cardiovascular;  Laterality: N/A;  . TUBAL LIGATION    . TUBAL LIGATION  1980    OB History   None      Home Medications    Prior to Admission medications   Medication Sig Start Date End Date Taking? Authorizing Provider  amLODipine (NORVASC) 10 MG tablet Take 10 mg by mouth daily. 01/25/17   [provider]  amLODipine (NORVASC) 5 MG tablet Take 5 mg by mouth daily.    [provider]  aspirin EC 81 MG tablet Take 81 mg by mouth daily.    [provider]  Azilsartan Medoxomil (EDARBI) 40 MG TABS Take 40 mg by mouth daily.    [provider]  diclofenac (VOLTAREN) 75 MG EC tablet Take 1 tablet (75 mg total) by mouth 2 (two)  times daily. 03/21/18   Robyn Haber, MD  hydrochlorothiazide (HYDRODIURIL) 25 MG tablet Take 25 mg by mouth daily.  02/23/17   [provider]  hydrochlorothiazide (MICROZIDE) 12.5 MG capsule Take 12.5 mg by mouth daily. 05/25/14   [provider]    Family History Family History  Problem Relation Age of Onset  . Heart disease Mother   . Diabetes Mother   . Hypertension Mother   . Diabetes Father   . Hypertension Father   . Diabetes Sister   . Hypertension Sister   . Heart disease Sister   . Diabetes Brother   . Hypertension Brother   . Heart disease Brother     Social History Social History   Tobacco Use  . Smoking status: Former Smoker    Last attempt to quit: 09/13/1980    Years since quitting:  37.5  . Smokeless tobacco: Never Used  Substance Use Topics  . Alcohol use: No  . Drug use: No     Allergies   Penicillins   Review of Systems Review of Systems  Constitutional: Negative.   HENT: Negative.   Cardiovascular: Positive for chest pain.  Gastrointestinal: Negative.      Physical Exam Triage Vital Signs ED Triage Vitals  Enc Vitals Group     BP 03/21/18 1632 132/86     Pulse Rate 03/21/18 1632 94     Resp 03/21/18 1632 20     Temp 03/21/18 1632 98 F (36.7 C)     Temp Source 03/21/18 1632 Oral     SpO2 03/21/18 1632 98 %     Weight --      Height --      Head Circumference --      Peak Flow --      Pain Score 03/21/18 1635 7     Pain Loc --      Pain Edu? --      Excl. in [place]? --    No data found.  Updated Vital Signs BP 132/86 (BP Location: Right Arm)   Pulse 94   Temp 98 F (36.7 C) (Oral)   Resp 20   SpO2 98%    Physical Exam  Constitutional: She appears well-developed and well-nourished.  HENT:  Head: Normocephalic.  Mouth/Throat: Oropharynx is clear and moist.  Eyes: Pupils are equal, round, and reactive to light. EOM are normal.  Cardiovascular: Normal rate, regular rhythm and normal heart sounds.  Pulmonary/Chest: Effort normal and breath sounds normal.  Abdominal: Soft. Normal appearance. There is no tenderness.  Skin: Skin is warm and dry.  Nursing note and vitals reviewed.    UC Treatments / Results  Labs (all labs ordered are listed, but only abnormal results are displayed) Labs Reviewed - No data to display  EKG None  Radiology No results found.  Procedures Procedures (including critical care time)  Medications Ordered in UC Medications - No data to display  Initial Impression / Assessment and Plan / UC Course  I have reviewed the triage vital signs and the nursing notes.  Pertinent labs & imaging results that were available during my care of the patient were reviewed by me and considered in my medical  decision making (see chart for details).    Final Clinical Impressions(s) / UC Diagnoses   Final diagnoses:  Chest wall pain     Discharge Instructions     Your abdomen is nontender.  Your lungs are clear.  He has no sign of a deep underlying  problem at this point.  Because the pain is reproduced by lifting your arm overhead and because there is no tenderness in your abdomen, I believe you have strained a muscle on the chest wall which should respond to the medicine prescribed.    ED Prescriptions    Medication Sig Dispense Auth. Provider   diclofenac (VOLTAREN) 75 MG EC tablet Take 1 tablet (75 mg total) by mouth 2 (two) times daily. 14 tablet Robyn Haber, MD     Controlled Substance Prescriptions [place] Controlled Substance Registry consulted? Not Applicable   Robyn Haber, MD 03/21/18 1700

## 2018-05-30 ENCOUNTER — Ambulatory Visit: Payer: Medicaid Other

## 2018-05-30 ENCOUNTER — Ambulatory Visit: Payer: Self-pay

## 2018-05-30 ENCOUNTER — Ambulatory Visit (INDEPENDENT_AMBULATORY_CARE_PROVIDER_SITE_OTHER): Payer: Medicaid Other | Admitting: Cardiology

## 2018-05-30 DIAGNOSIS — R002 Palpitations: Secondary | ICD-10-CM

## 2018-05-30 DIAGNOSIS — R82998 Other abnormal findings in urine: Secondary | ICD-10-CM

## 2018-05-30 DIAGNOSIS — R0609 Other forms of dyspnea: Secondary | ICD-10-CM

## 2018-06-01 DIAGNOSIS — R002 Palpitations: Secondary | ICD-10-CM | POA: Diagnosis not present

## 2018-06-07 ENCOUNTER — Encounter: Payer: Self-pay | Admitting: Cardiology

## 2018-06-07 NOTE — Progress Notes (Signed)
Patient referred for holter monitor

## 2018-10-13 ENCOUNTER — Other Ambulatory Visit: Payer: Self-pay

## 2018-10-13 ENCOUNTER — Encounter (HOSPITAL_COMMUNITY): Payer: Self-pay

## 2018-10-13 ENCOUNTER — Emergency Department (HOSPITAL_COMMUNITY)
Admission: EM | Admit: 2018-10-13 | Discharge: 2018-10-14 | Disposition: A | Discharge status: Home / Self Care | Payer: Medicaid Other | Attending: Emergency Medicine | Admitting: Emergency Medicine

## 2018-10-13 DIAGNOSIS — M62838 Other muscle spasm: Secondary | ICD-10-CM

## 2018-10-13 DIAGNOSIS — Z7982 Long term (current) use of aspirin: Secondary | ICD-10-CM | POA: Insufficient documentation

## 2018-10-13 DIAGNOSIS — E119 Type 2 diabetes mellitus without complications: Secondary | ICD-10-CM | POA: Insufficient documentation

## 2018-10-13 DIAGNOSIS — Z7984 Long term (current) use of oral hypoglycemic drugs: Secondary | ICD-10-CM | POA: Diagnosis not present

## 2018-10-13 DIAGNOSIS — Z87891 Personal history of nicotine dependence: Secondary | ICD-10-CM | POA: Insufficient documentation

## 2018-10-13 DIAGNOSIS — I10 Essential (primary) hypertension: Secondary | ICD-10-CM | POA: Diagnosis not present

## 2018-10-13 DIAGNOSIS — Z880 Allergy status to penicillin: Secondary | ICD-10-CM | POA: Insufficient documentation

## 2018-10-13 DIAGNOSIS — Z79899 Other long term (current) drug therapy: Secondary | ICD-10-CM | POA: Insufficient documentation

## 2018-10-13 DIAGNOSIS — M542 Cervicalgia: Secondary | ICD-10-CM | POA: Diagnosis present

## 2018-10-13 DIAGNOSIS — M436 Torticollis: Secondary | ICD-10-CM

## 2018-10-13 HISTORY — DX: Type 2 diabetes mellitus without complications: E11.9

## 2018-10-13 HISTORY — DX: Pure hypercholesterolemia, unspecified: E78.00

## 2018-10-13 NOTE — ED Triage Notes (Signed)
Pt reports waking up this morning with right sided neck pain, sharp and stabbing. Pt is able to turn her head from side to side. Pt alert, oriented.

## 2018-10-14 MED ORDER — KETOROLAC TROMETHAMINE 60 MG/2ML IM SOLN
30.0000 mg | Freq: Once | INTRAMUSCULAR | Status: AC
  Administered 2018-10-14: 30 mg via INTRAMUSCULAR
  Filled 2018-10-14: qty 2

## 2018-10-14 NOTE — ED Provider Notes (Signed)
[facility] EMERGENCY DEPARTMENT Provider Note  CSN: [id number] Arrival date & time: 10/13/18 2234  Chief Complaint(s) Neck Pain  HPI Carol Wilkins is a 63 y.o. female    Neck Pain Pain location:  R side Quality:  Cramping and stabbing Pain radiates to:  Head Pain severity:  Severe Onset quality:  Gradual Duration:  1 day Timing:  Constant Progression:  Waxing and waning Chronicity:  New Context comment:  Noticed it after awakening this am Relieved by:  Position Worsened by:  Position, twisting and bending Associated symptoms: no chest pain, no fever, no headaches, no leg pain, no numbness, no photophobia, no tingling, no visual change and no weakness     Past Medical History Past Medical History:  Diagnosis Date  . Anxiety attack   . Diabetes mellitus without complication ([place])    type 2 on metformin  . High cholesterol   . Hypertension    Patient Active Problem List   Diagnosis Date Noted  . Low back pain 01/25/2012  . Abdominal pain 11/02/2011  . Obesity (BMI 30.0-34.9) 10/31/2011  . Post herpetic neuralgia 09/14/2011  . DEPRESSION 12/22/2006  . HYPERTENSION 12/22/2006  . ALLERGIC RHINITIS 12/22/2006  . PALPITATIONS, HX OF 12/22/2006   Home Medication(s) Prior to Admission medications   Medication Sig Start Date End Date Taking? Authorizing Provider  amLODipine (NORVASC) 10 MG tablet Take 10 mg by mouth daily. 01/25/17   [provider]  amLODipine (NORVASC) 5 MG tablet Take 5 mg by mouth daily.    [provider]  aspirin EC 81 MG tablet Take 81 mg by mouth daily.    [provider]  Azilsartan Medoxomil (EDARBI) 40 MG TABS Take 40 mg by mouth daily.    [provider]  diclofenac (VOLTAREN) 75 MG EC tablet Take 1 tablet (75 mg total) by mouth 2 (two) times daily. 03/21/18   Robyn Haber, MD  hydrochlorothiazide (HYDRODIURIL) 25 MG tablet Take 25 mg by mouth daily.  02/23/17   [provider]  hydrochlorothiazide (MICROZIDE) 12.5 MG capsule Take 12.5 mg by mouth daily. 05/25/14   [provider]                                                                                                                                    Past Surgical History Past Surgical History:  Procedure Laterality Date  . ABDOMINAL HYSTERECTOMY     in early 90's  . CHOLECYSTECTOMY     in the 90's  . TEE WITHOUT CARDIOVERSION N/A 07/14/2015   Procedure: TRANSESOPHAGEAL ECHOCARDIOGRAM (TEE);  Surgeon: Adrian Prows, MD;  Location: [facility];  Service: Cardiovascular;  Laterality: N/A;  . TUBAL LIGATION    . TUBAL LIGATION  1980   Family History Family History  Problem Relation Age of Onset  . Heart disease Mother   . Diabetes Mother   . Hypertension Mother   .  Diabetes Father   . Hypertension Father   . Diabetes Sister   . Hypertension Sister   . Heart disease Sister   . Diabetes Brother   . Hypertension Brother   . Heart disease Brother     Social History Social History   Tobacco Use  . Smoking status: Former Smoker    Quit date: 09/13/1980    Years since quitting: 38.1  . Smokeless tobacco: Never Used  Substance Use Topics  . Alcohol use: No  . Drug use: No   Allergies Penicillins  Review of Systems Review of Systems  Constitutional: Negative for fever.  Eyes: Negative for photophobia.  Cardiovascular: Negative for chest pain.  Musculoskeletal: Positive for neck pain.  Neurological: Negative for tingling, weakness, numbness and headaches.   All other systems are reviewed and are negative for acute change except as noted in the HPI  Physical Exam Vital Signs  I have reviewed the triage vital signs BP (!) 161/75   Pulse 78   Temp 98.2 F (36.8 C) (Oral)   Resp 18   Ht 5\' 3"  (1.6 m)   Wt 81.2 kg   SpO2 100%   BMI 31.71 kg/m   Physical Exam Vitals signs reviewed.  Constitutional:      General: She is not in acute distress.    Appearance: She is  well-developed. She is not diaphoretic.  HENT:     Head: Normocephalic and atraumatic.     Nose: Nose normal.  Eyes:     General: No scleral icterus.       Right eye: No discharge.        Left eye: No discharge.     Conjunctiva/sclera: Conjunctivae normal.     Pupils: Pupils are equal, round, and reactive to light.  Neck:     Musculoskeletal: Neck supple. Decreased range of motion. Muscular tenderness (spastic scalene muscle on right) present. No neck rigidity or spinous process tenderness.  Cardiovascular:     Rate and Rhythm: Normal rate and regular rhythm.     Pulses:          Carotid pulses are 2+ on the right side and 2+ on the left side.    Heart sounds: No murmur. No friction rub. No gallop.      Comments: No carotid bruits Pulmonary:     Effort: Pulmonary effort is normal. No respiratory distress.     Breath sounds: Normal breath sounds. No stridor. No rales.  Abdominal:     General: There is no distension.     Palpations: Abdomen is soft.     Tenderness: There is no abdominal tenderness.  Musculoskeletal:        General: No tenderness.  Skin:    General: Skin is warm and dry.     Findings: No erythema or rash.  Neurological:     Mental Status: She is alert and oriented to person, place, and time.     Comments: Mental Status:  Alert and oriented to person, place, and time.  Attention and concentration normal.  Speech clear.  Recent memory is intact  Cranial Nerves:  II Visual Fields: Intact to confrontation. Visual fields intact. III, IV, VI: Pupils equal and reactive to light and near. Full eye movement without nystagmus  V Facial Sensation: Normal. No weakness of masticatory muscles  VII: No facial weakness or asymmetry  VIII Auditory Acuity: Grossly normal  IX/X: The uvula is midline; the palate elevates symmetrically  XI: Normal sternocleidomastoid and trapezius strength  XII: The tongue is midline. No atrophy or fasciculations.   Motor System: Muscle  Strength: 5/5 and symmetric in the upper and lower extremities. No pronation or drift.  Muscle Tone: Tone and muscle bulk are normal in the upper and lower extremities.   Reflexes: DTRs: 1+ and symmetrical in all four extremities. No Clonus Coordination:  No tremor.  Sensation: Intact to light touch Negative Romberg test.  Gait: Routine gait normal.      ED Results and Treatments Labs (all labs ordered are listed, but only abnormal results are displayed) Labs Reviewed - No data to display                                                                                                                       EKG  EKG Interpretation  Date/Time:    Ventricular Rate:    PR Interval:    QRS Duration:   QT Interval:    QTC Calculation:   R Axis:     Text Interpretation:        Radiology No results found.  Pertinent labs & imaging results that were available during my care of the patient were reviewed by me and considered in my medical decision making (see chart for details).  Medications Ordered in ED Medications  ketorolac (TORADOL) injection 30 mg (has no administration in time range)                                                                                                                                    Procedures Procedures  (including critical care time)  Medical Decision Making / ED Course I have reviewed the nursing notes for this encounter and the patient's prior records (if available in EHR or on provided paperwork).  Torticollis. No visual/focal deficits or carotid bruits concerning to carotid dissection. No fever or nuchal rigidity concerning meningitis.  Given toradol for pain.  The patient appears reasonably screened and/or stabilized for discharge and I doubt any other medical condition or other [facility] requiring further screening, evaluation, or treatment in the ED at this time prior to discharge.  The patient is safe for discharge with strict return  precautions.        Final Clinical Impression(s) / ED Diagnoses Final diagnoses:  Torticollis  Muscle spasms of neck    The patient appears reasonably screened and/or stabilized for discharge and I doubt any other medical condition or  other [place] requiring further screening, evaluation, or treatment in the ED at this time prior to discharge.  Disposition: Discharge  Condition: Good  I have discussed the results, Dx and Tx plan with the patient who expressed understanding and agree(s) with the plan. Discharge instructions discussed at great length. The patient was given strict return precautions who verbalized understanding of the instructions. No further questions at time of discharge.    ED Discharge Orders    None       Follow Up: Iona Beard, [address] [place] [address] [phone]  In 2 weeks If symptoms do not improve or  worsen      This chart was dictated using voice recognition software.  Despite best efforts to proofread,  errors can occur which can change the documentation meaning.   Fatima Blank, MD 10/14/18 [phone]

## 2018-10-14 NOTE — Discharge Instructions (Addendum)
You may use over-the-counter Motrin (Ibuprofen), Acetaminophen (Tylenol), topical muscle creams such as SalonPas, Icy Hot, Bengay, etc. Please stretch, apply heat, and have massage therapy for additional assistance. ° °

## 2018-11-23 ENCOUNTER — Other Ambulatory Visit: Payer: Self-pay | Admitting: Family Medicine

## 2018-11-23 DIAGNOSIS — Z1231 Encounter for screening mammogram for malignant neoplasm of breast: Secondary | ICD-10-CM

## 2019-01-09 ENCOUNTER — Ambulatory Visit
Admission: RE | Admit: 2019-01-09 | Discharge: 2019-01-09 | Disposition: A | Discharge status: Home / Self Care | Payer: Medicaid Other | Source: Ambulatory Visit | Attending: Family Medicine | Admitting: Family Medicine

## 2019-01-09 ENCOUNTER — Other Ambulatory Visit: Payer: Self-pay

## 2019-01-09 DIAGNOSIS — Z1231 Encounter for screening mammogram for malignant neoplasm of breast: Secondary | ICD-10-CM

## 2019-01-30 ENCOUNTER — Ambulatory Visit: Payer: Medicaid Other | Attending: Orthopedic Surgery | Admitting: Physical Therapy

## 2019-01-30 ENCOUNTER — Encounter: Payer: Self-pay | Admitting: Physical Therapy

## 2019-01-30 ENCOUNTER — Other Ambulatory Visit: Payer: Self-pay

## 2019-01-30 DIAGNOSIS — R293 Abnormal posture: Secondary | ICD-10-CM

## 2019-01-30 DIAGNOSIS — G8929 Other chronic pain: Secondary | ICD-10-CM

## 2019-01-30 DIAGNOSIS — M62830 Muscle spasm of back: Secondary | ICD-10-CM | POA: Diagnosis present

## 2019-01-30 DIAGNOSIS — M545 Low back pain, unspecified: Secondary | ICD-10-CM

## 2019-01-30 NOTE — Therapy (Signed)
[place], [place], [address] Phone: [phone]   Fax:  [phone]  Physical Therapy Evaluation  Patient Details  Name: Carol Wilkins MRN: [id number] Date of Birth: July 09, 1955 Referring Provider (PT): Phylliss Bob   Encounter Date: 01/30/2019  PT End of Session - 01/30/19 1544    Visit Number  1    Number of Visits  13    Date for PT Re-Evaluation  03/27/19    Authorization Type  MCD: resubmit at 4th visit    PT Start Time  1545    PT Stop Time  1625    PT Time Calculation (min)  40 min    Activity Tolerance  Patient tolerated treatment well    Behavior During Therapy  [facility] for tasks assessed/performed       Past Medical History:  Diagnosis Date  . Anxiety attack   . Diabetes mellitus without complication ([place])    type 2 on metformin  . High cholesterol   . Hypertension     Past Surgical History:  Procedure Laterality Date  . ABDOMINAL HYSTERECTOMY     in early 90's  . CHOLECYSTECTOMY     in the 90's  . TEE WITHOUT CARDIOVERSION N/A 07/14/2015   Procedure: TRANSESOPHAGEAL ECHOCARDIOGRAM (TEE);  Surgeon: Adrian Prows, MD;  Location: [place];  Service: Cardiovascular;  Laterality: N/A;  . TUBAL LIGATION    . TUBAL LIGATION  1980    There were no vitals filed for this visit.   Subjective Assessment - 01/30/19 1552    Subjective  pt is a 63 y.o F with CC of low back pain that started about a year ago. she reports there was no specific MOI but reports it was potentially from 30 years of medical personal care. since onset the pain seems to be worse, and is increasing with walking/ standing. pt denies red flags, prior to this she reports no hx of L low back pain.    Limitations  Standing;Sitting    How long can you sit comfortably?  30 min    How long can you stand comfortably?  15 min    How long can you walk comfortably?  30-45    Diagnostic tests  x-ray at MD's office    Patient  Stated Goals  to decrease pain, return to daily living activites    Currently in Pain?  Yes    Pain Score  6    at worst 10/10   Pain Location  Back    Pain Orientation  Left;Lower;Right   L>R   Pain Descriptors / Indicators  Pressure;Aching    Pain Type  Chronic pain    Pain Onset  More than a month ago    Pain Frequency  Constant    Aggravating Factors   prolonged standing/ sitting, bending forward    Pain Relieving Factors  tylenol,    Effect of Pain on Daily Activities  limited standing/ walking tolerance         [facility] PT Assessment - 01/30/19 1545      Assessment   Medical Diagnosis  Low back pain    Referring Provider (PT)  Phylliss Bob    Onset Date/Surgical Date  --   1 year ago   Hand Dominance  Right    Next MD Visit  make one after PT    Prior Therapy  yes      Precautions   Precautions  None  Restrictions   Weight Bearing Restrictions  No      Balance Screen   Has the patient fallen in the past 6 months  Yes    How many times?  1    Has the patient had a decrease in activity level because of a fear of falling?   No    Is the patient reluctant to leave their home because of a fear of falling?   No      Home Environment   Living Environment  Private residence    Living Arrangements  Alone    Type of [place] to enter    Entrance Stairs-Number of Steps  4    Entrance Stairs-Rails  Can reach both    [place]  One level    [place] bars - tub/shower;Cane - single point      Prior Function   Level of Independence  Independent with basic ADLs    Vocation  --   trying for disability   Leisure  walking, word search puzzles, church      Cognition   Overall Cognitive Status  Within Functional Limits for tasks assessed      Posture/Postural Control   Posture/Postural Control  Postural limitations    Postural Limitations  Rounded Shoulders;Forward head      ROM / Strength   AROM / PROM / Strength   AROM;PROM;Strength      AROM   AROM Assessment Site  Lumbar    Lumbar Flexion  74   reproduced concordant pain   Lumbar Extension  23   reproduced concordant pain   Lumbar - Right Side Bend  16   tightness noted on the L at end range   Lumbar - Left Side Bend  16   tightness noted during movement     Strength   Strength Assessment Site  Hip;Knee    Right/Left Hip  Right;Left    Right Hip Flexion  4/5    Right Hip Extension  4-/5    Right Hip ABduction  4-/5    Right Hip ADduction  4/5    Left Hip Flexion  4-/5    Left Hip Extension  4-/5    Left Hip ABduction  4-/5    Left Hip ADduction  4/5    Right/Left Knee  Right;Left    Right Knee Flexion  5/5    Right Knee Extension  5/5    Left Knee Flexion  5/5    Left Knee Extension  5/5      Palpation   Palpation comment  TTP along the the L SIJ and L lumbar paraspinals      Special Tests    Special Tests  Lumbar;Sacrolliac Tests    Lumbar Tests  Straight Leg Raise;Prone Knee Bend Test    Sacroiliac Tests   Sacral Thrust   (+) forward flexion with limited L PSIS movement superior     Prone Knee Bend Test   Findings  Negative      Straight Leg Raise   Findings  Negative      Sacral thrust    Findings  Positive    Side  Left      Gaenslen's test   Findings  Positive    Side   Left                Objective measurements completed on examination: See above findings.  PT Education - 01/30/19 1546    Education Details  evaluation findings, POC, goals, HEP with proper form/ rationale    Person(s) Educated  Patient    Methods  Explanation;Verbal cues;Handout    Comprehension  Verbalized understanding;Verbal cues required       PT Short Term Goals - 01/30/19 1630      PT SHORT TERM GOAL #1   Title  pt to be I with inital HEP    Baseline  no previous HEP    Time  3    Period  Weeks    Status  New    Target Date  02/20/19      PT SHORT TERM GOAL #2   Title  pt to verbalize/ demo  proper posture and lifting mechanics to reduce and prevent low back pain    Baseline  limited knowledge of proper posture    Time  3    Period  Weeks    Status  New    Target Date  02/20/19        PT Long Term Goals - 01/30/19 1631      PT LONG TERM GOAL #1   Title  pt to increase bil trunk sidebending by >/= 8 degrees and maintain all trunk ROM with </= 2/10 pain for functional mobiltiy    Baseline  bil side bending 16 degrees reporting pain at 5/10    Time  6    Period  Weeks    Status  New    Target Date  03/27/19      PT LONG TERM GOAL #2   Title  increase L hip gross strength to >/=4+/5 to promote hip stability and reduce pain to </= 2/10    Baseline  L hip flexion, abduction/ extension 4-/5,    Time  6    Period  Weeks    Status  New    Target Date  03/27/19      PT LONG TERM GOAL #3   Title  pt to be able to walk / stand for >/= 60 min and perform sit to stand transitions with no limtations for functional endurance / mobility    Baseline  pain with prolonged sitting with transition to standing.    Time  6    Period  Weeks    Status  New    Target Date  03/27/19      PT LONG TERM GOAL #4   Title  pt to be Ind with all HEP given as of last visit to maintain and progress current level of function ind    Baseline  no previous HEP    Time  6    Period  Weeks    Status  New    Target Date  03/13/19             Plan - 01/30/19 1625    Clinical Impression Statement  pt presents to OPPT with CC of chronic L LBP starting about a year ago with no specific onset. She has functional trunk ROM but reports concordant symptoms with all motions. 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 She would benefit from physical therapy to decrease low back pain, reduce spasm, increase hip strength and maximize her function by addressing the deficits  listed.    Personal Factors and Comorbidities  Comorbidity 2    Comorbidities  hx of DM, Anxiety    Examination-Activity Limitations  Stand;Locomotion Level;Sit    Stability/Clinical Decision Making  Evolving/Moderate complexity    Clinical Decision Making  Moderate    Rehab Potential  Good    PT Frequency  2x / week    PT Duration  6 weeks   initial MCD auth 1 x a week for 3 weeks   PT Treatment/Interventions  ADLs/Self Care Home Management;Cryotherapy;Electrical Stimulation;Iontophoresis 4mg /ml Dexamethasone;Moist Heat;Traction;Ultrasound;Therapeutic activities;Therapeutic exercise;Patient/family education;Manual techniques;Passive range of motion;Dry needling;Taping;Joint Manipulations    PT Next Visit Plan  review/ update HEP PRN, STW along L lumbar paraspinals, potential inflare on L SIJ, hip abductor STW, adductor activation, posture education, modalities PRN    PT Home Exercise Plan  lower trunk rotation, isometric hip adduction, hip abduction stretching, hamstring stretching (supine/ sitting)       Patient will benefit from skilled therapeutic intervention in order to improve the following deficits and impairments:  Improper body mechanics, Postural dysfunction, Increased muscle spasms, Decreased strength, Pain, Impaired UE functional use, Impaired vision/preception, Decreased endurance, Decreased balance, Decreased activity tolerance  Visit Diagnosis: Chronic bilateral low back pain, unspecified whether sciatica present  Muscle spasm of back  Abnormal posture     Problem List Patient Active Problem List   Diagnosis Date Noted  . Low back pain 01/25/2012  . Abdominal pain 11/02/2011  . Obesity (BMI 30.0-34.9) 10/31/2011  . Post herpetic neuralgia 09/14/2011  . DEPRESSION 12/22/2006  . HYPERTENSION 12/22/2006  . ALLERGIC RHINITIS 12/22/2006  . PALPITATIONS, HX OF 12/22/2006   Starr Lake PT, DPT, LAT, ATC  01/30/19  4:43 PM      [facility] [address] [place], [place], [address] Phone: [phone]   Fax:  [phone]  Name: Carol Wilkins MRN: [id number] Date of Birth: 02-27-1956

## 2019-02-13 ENCOUNTER — Other Ambulatory Visit: Payer: Self-pay

## 2019-02-13 ENCOUNTER — Encounter: Payer: Self-pay | Admitting: Physical Therapy

## 2019-02-13 ENCOUNTER — Ambulatory Visit: Payer: Medicaid Other | Admitting: Physical Therapy

## 2019-02-13 DIAGNOSIS — G8929 Other chronic pain: Secondary | ICD-10-CM

## 2019-02-13 DIAGNOSIS — M545 Low back pain: Secondary | ICD-10-CM | POA: Diagnosis not present

## 2019-02-13 DIAGNOSIS — M62830 Muscle spasm of back: Secondary | ICD-10-CM

## 2019-02-13 DIAGNOSIS — R293 Abnormal posture: Secondary | ICD-10-CM

## 2019-02-13 NOTE — Therapy (Addendum)
Carol Wil[place], [place], [address] Phone: [phone]   Fax:  [phone]  Physical Therapy Treatment  Patient Details  Name: Carol Wilkins MRN: [id number] Date of Birth: 04/13/1956 Referring Provider (PT): Phylliss Bob   Encounter Date: 02/13/2019  PT End of Session - 02/13/19 1018    Visit Number  2    Number of Visits  13    Date for PT Re-Evaluation  03/27/19    Authorization Type  MCD: resubmit at 4th visit    Authorization Time Period  02/12/2019-03/04/2019    Authorization - Visit Number  1    Authorization - Number of Visits  3    PT Start Time  [id number]    PT Stop Time  1055    PT Time Calculation (min)  40 min       Past Medical History:  Diagnosis Date  . Anxiety attack   . Diabetes mellitus without complication ([place])    type 2 on metformin  . High cholesterol   . Hypertension     Past Surgical History:  Procedure Laterality Date  . ABDOMINAL HYSTERECTOMY     in early 90's  . CHOLECYSTECTOMY     in the 90's  . TEE WITHOUT CARDIOVERSION N/A 07/14/2015   Procedure: TRANSESOPHAGEAL ECHOCARDIOGRAM (TEE);  Surgeon: Adrian Prows, MD;  Location: [place];  Service: Cardiovascular;  Laterality: N/A;  . TUBAL LIGATION    . TUBAL LIGATION  1980    There were no vitals filed for this visit.  Subjective Assessment - 02/13/19 1017    Subjective  Pt arrives reporting she did okay with the HEP. She reports no pain today.    Currently in Pain?  No/denies                       [facility] Adult PT Treatment/Exercise - 02/13/19 0001      Lumbar Exercises: Stretches   Active Hamstring Stretch  Right;Left;3 reps;30 seconds    Lower Trunk Rotation  10 seconds    Lower Trunk Rotation Limitations  10 reps    ITB Stretch Limitations  sidelying , mod cues , left       Lumbar Exercises: Aerobic   Nustep  L3 UE/LE x 5 minutes       Lumbar Exercises: Seated   Other Seated Lumbar  Exercises  ball squeeze 10 x 2       Lumbar Exercises: Supine   Clam Limitations  red band 10 x 2     Bridge  10 reps    Bridge Limitations  cues for gluteal and abdominal squeeze     Other Supine Lumbar Exercises  ball squeeze with gluteal set x 10       Manual Therapy   Manual therapy comments  masage roller to posteriolateral left hip/low back             PT Education - 02/13/19 1052    Education Details  HEP    Person(s) Educated  Patient    Methods  Explanation;Handout    Comprehension  Verbalized understanding       PT Short Term Goals - 01/30/19 1630      PT SHORT TERM GOAL #1   Title  pt to be I with inital HEP    Baseline  no previous HEP    Time  3    Period  Weeks    Status  New  Target Date  02/20/19      PT SHORT TERM GOAL #2   Title  pt to verbalize/ demo proper posture and lifting mechanics to reduce and prevent low back pain    Baseline  limited knowledge of proper posture    Time  3    Period  Weeks    Status  New    Target Date  02/20/19        PT Long Term Goals - 01/30/19 1631      PT LONG TERM GOAL #1   Title  pt to increase bil trunk sidebending by >/= 8 degrees and maintain all trunk ROM with </= 2/10 pain for functional mobiltiy    Baseline  bil side bending 16 degrees reporting pain at 5/10    Time  6    Period  Weeks    Status  New    Target Date  03/27/19      PT LONG TERM GOAL #2   Title  increase L hip gross strength to >/=4+/5 to promote hip stability and reduce pain to </= 2/10    Baseline  L hip flexion, abduction/ extension 4-/5,    Time  6    Period  Weeks    Status  New    Target Date  03/27/19      PT LONG TERM GOAL #3   Title  pt to be able to walk / stand for >/= 60 min and perform sit to stand transitions with no limtations for functional endurance / mobility    Baseline  pain with prolonged sitting with transition to standing.    Time  6    Period  Weeks    Status  New    Target Date  03/27/19      PT  LONG TERM GOAL #4   Title  pt to be Ind with all HEP given as of last visit to maintain and progress current level of function ind    Baseline  no previous HEP    Time  6    Period  Weeks    Status  New    Target Date  03/13/19            Plan - 02/13/19 1020    Clinical Impression Statement  [name] reports compliance with HEP and no pain today on arrival. Reviewed and progressed per PT POC. Manual performed to lateral left hip and she noted soreness that improved with massage roller. Updated HEP. No increased pain post session.    PT Next Visit Plan  review/ update HEP PRN, STW along L lumbar paraspinals, potential inflare on L SIJ, hip abductor STW, adductor activation, posture education, modalities PRN    PT Home Exercise Plan  lower trunk rotation, isometric hip adduction, hip abduction stretching, hamstring stretching (supine/ sitting), bridge, supine clam red band       Patient will benefit from skilled therapeutic intervention in order to improve the following deficits and impairments:  Improper body mechanics, Postural dysfunction, Increased muscle spasms, Decreased strength, Pain, Impaired UE functional use, Impaired vision/preception, Decreased endurance, Decreased balance, Decreased activity tolerance  Visit Diagnosis: Chronic bilateral low back pain, unspecified whether sciatica present  Muscle spasm of back  Abnormal posture     Problem List Patient Active Problem List   Diagnosis Date Noted  . Low back pain 01/25/2012  . Abdominal pain 11/02/2011  . Obesity (BMI 30.0-34.9) 10/31/2011  . Post herpetic neuralgia 09/14/2011  . DEPRESSION 12/22/2006  .  HYPERTENSION 12/22/2006  . ALLERGIC RHINITIS 12/22/2006  . PALPITATIONS, HX OF 12/22/2006    Dorene Ar, PTA 02/13/2019, 11:06 AM  [facility] [address] [place], [place], [address] Phone: [phone]   Fax:  [phone]  Name: Carol Wilkins MRN: [id number] Date of Birth: July 21, 1955

## 2019-02-20 ENCOUNTER — Encounter: Payer: Self-pay | Admitting: Physical Therapy

## 2019-02-20 ENCOUNTER — Ambulatory Visit: Payer: Medicaid Other | Attending: Orthopedic Surgery | Admitting: Physical Therapy

## 2019-02-20 ENCOUNTER — Other Ambulatory Visit: Payer: Self-pay

## 2019-02-20 DIAGNOSIS — M545 Low back pain: Secondary | ICD-10-CM | POA: Diagnosis not present

## 2019-02-20 DIAGNOSIS — M62830 Muscle spasm of back: Secondary | ICD-10-CM

## 2019-02-20 DIAGNOSIS — G8929 Other chronic pain: Secondary | ICD-10-CM | POA: Insufficient documentation

## 2019-02-20 DIAGNOSIS — R293 Abnormal posture: Secondary | ICD-10-CM | POA: Diagnosis present

## 2019-02-20 NOTE — Patient Instructions (Signed)

## 2019-02-20 NOTE — Therapy (Signed)
[place], [place], [address] Phone: [phone]   Fax:  [phone]  Physical Therapy Treatment  Patient Details  Name: Carol Wilkins MRN: [id number] Date of Birth: [birth year] Referring Provider (PT): Phylliss Bob   Encounter Date: 02/20/2019  PT End of Session - 02/20/19 1426    Visit Number  3    Number of Visits  13    Date for PT Re-Evaluation  03/27/19    Authorization Type  MCD: resubmit at 4th visit    Authorization - Visit Number  2    Authorization - Number of Visits  3    PT Start Time  [id number]    PT Stop Time  1456    PT Time Calculation (min)  40 min    Activity Tolerance  Patient tolerated treatment well    Behavior During Therapy  [facility] for tasks assessed/performed       Past Medical History:  Diagnosis Date  . Anxiety attack   . Diabetes mellitus without complication ([place])    type 2 on metformin  . High cholesterol   . Hypertension     Past Surgical History:  Procedure Laterality Date  . ABDOMINAL HYSTERECTOMY     in early 90's  . CHOLECYSTECTOMY     in the 90's  . TEE WITHOUT CARDIOVERSION N/A 07/14/2015   Procedure: TRANSESOPHAGEAL ECHOCARDIOGRAM (TEE);  Surgeon: Adrian Prows, MD;  Location: [place];  Service: Cardiovascular;  Laterality: N/A;  . TUBAL LIGATION    . TUBAL LIGATION  1980    There were no vitals filed for this visit.  Subjective Assessment - 02/20/19 1418    Subjective  "I did alot on Saturday and I was really sore. the back is getting better with exercise"    Currently in Pain?  Yes    Pain Score  5     Pain Location  Back    Pain Orientation  Left    Pain Descriptors / Indicators  Aching    Pain Type  Chronic pain         [facility] PT Assessment - 02/20/19 0001      Assessment   Medical Diagnosis  Low back pain    Referring Provider (PT)  Phylliss Bob                   [facility] Adult PT Treatment/Exercise - 02/20/19 0001      Lumbar Exercises: Stretches   ITB Stretch  2 reps;30 seconds;Right   in R sidelying     Lumbar Exercises: Aerobic   Nustep  L5 x 5 min UE/LE       Lumbar Exercises: Supine   Clam Limitations  green theraband 2 x 10    Bridge  10 reps    Bridge Limitations  continued glute and abd set during exercise    Other Supine Lumbar Exercises  supine marching with green theraband around the knees,   with sustained abd set with posterior pelvic titl     Lumbar Exercises: Sidelying   Other Sidelying Lumbar Exercises  balls squeeze while R sidelying 2 x 10 x 10 sec hold   combined with manual innominate mob     Manual Therapy   Manual Therapy  Soft tissue mobilization;Joint mobilization    Manual therapy comments  skilled palpation and monitoring of pt throughout TPDN    Joint Mobilization  medial innominate mob grade III in R sidelying    Soft tissue  mobilization  IASTM along the glute med/min       Trigger Point Dry Needling - 02/20/19 0001    Consent Given?  Yes    Education Handout Provided  Yes    Muscles Treated Back/Hip  Gluteus medius           PT Education - 02/20/19 1425    Education Details  muscle anatomy and referral patterns. What TPDN is, benefits and what to expect.    Person(s) Educated  Patient    Methods  Explanation;Verbal cues;Handout    Comprehension  Verbalized understanding;Verbal cues required       PT Short Term Goals - 01/30/19 1630      PT SHORT TERM GOAL #1   Title  pt to be I with inital HEP    Baseline  no previous HEP    Time  3    Period  Weeks    Status  New    Target Date  02/20/19      PT SHORT TERM GOAL #2   Title  pt to verbalize/ demo proper posture and lifting mechanics to reduce and prevent low back pain    Baseline  limited knowledge of proper posture    Time  3    Period  Weeks    Status  New    Target Date  02/20/19        PT Long Term Goals - 01/30/19 1631      PT LONG TERM GOAL #1   Title  pt to increase bil trunk  sidebending by >/= 8 degrees and maintain all trunk ROM with </= 2/10 pain for functional mobiltiy    Baseline  bil side bending 16 degrees reporting pain at 5/10    Time  6    Period  Weeks    Status  New    Target Date  03/27/19      PT LONG TERM GOAL #2   Title  increase L hip gross strength to >/=4+/5 to promote hip stability and reduce pain to </= 2/10    Baseline  L hip flexion, abduction/ extension 4-/5,    Time  6    Period  Weeks    Status  New    Target Date  03/27/19      PT LONG TERM GOAL #3   Title  pt to be able to walk / stand for >/= 60 min and perform sit to stand transitions with no limtations for functional endurance / mobility    Baseline  pain with prolonged sitting with transition to standing.    Time  6    Period  Weeks    Status  New    Target Date  03/27/19      PT LONG TERM GOAL #4   Title  pt to be Ind with all HEP given as of last visit to maintain and progress current level of function ind    Baseline  no previous HEP    Time  6    Period  Weeks    Status  New    Target Date  03/13/19            Plan - 02/20/19 1457    Clinical Impression Statement  pt notes increased soreness in the L / R glute med following prolonged standing/ walking and bending. educated and consent was given for TPDN focusing on L glute medius followed with IASTM techniques. continued SIJ treatment with stretch of hip abductiors and  activation of the adductors.    PT Treatment/Interventions  ADLs/Self Care Home Management;Cryotherapy;Electrical Stimulation;Iontophoresis 4mg /ml Dexamethasone;Moist Heat;Traction;Ultrasound;Therapeutic activities;Therapeutic exercise;Patient/family education;Manual techniques;Passive range of motion;Dry needling;Taping;Joint Manipulations    PT Next Visit Plan  review/ update HEP PRN, STW along L lumbar paraspinals, potential inflare on L SIJ, hip abductor STW, adductor activation, posture education, modalities PRN, response to DN.    PT Home  Exercise Plan  lower trunk rotation, isometric hip adduction, hip abduction stretching, hamstring stretching (supine/ sitting), bridge, supine clam red band    Consulted and Agree with Plan of Care  Patient       Patient will benefit from skilled therapeutic intervention in order to improve the following deficits and impairments:  Improper body mechanics, Postural dysfunction, Increased muscle spasms, Decreased strength, Pain, Impaired UE functional use, Impaired vision/preception, Decreased endurance, Decreased balance, Decreased activity tolerance  Visit Diagnosis: Chronic bilateral low back pain, unspecified whether sciatica present  Muscle spasm of back  Abnormal posture     Problem List Patient Active Problem List   Diagnosis Date Noted  . Low back pain 01/25/2012  . Abdominal pain 11/02/2011  . Obesity (BMI 30.0-34.9) 10/31/2011  . Post herpetic neuralgia 09/14/2011  . DEPRESSION 12/22/2006  . HYPERTENSION 12/22/2006  . ALLERGIC RHINITIS 12/22/2006  . PALPITATIONS, HX OF 12/22/2006   Starr Lake PT, DPT, LAT, ATC  02/20/19  3:12 PM      [facility] [address] [place], [place], [address] Phone: [phone]   Fax:  [phone]  Name: Carol Wilkins MRN: [id number] Date of Birth: 1955/10/28

## 2019-02-27 ENCOUNTER — Ambulatory Visit: Payer: Medicaid Other | Admitting: Physical Therapy

## 2019-02-28 ENCOUNTER — Encounter: Payer: Self-pay | Admitting: Physical Therapy

## 2019-02-28 ENCOUNTER — Other Ambulatory Visit: Payer: Self-pay

## 2019-02-28 ENCOUNTER — Ambulatory Visit: Payer: Medicaid Other | Admitting: Physical Therapy

## 2019-02-28 DIAGNOSIS — R293 Abnormal posture: Secondary | ICD-10-CM

## 2019-02-28 DIAGNOSIS — M545 Low back pain: Secondary | ICD-10-CM | POA: Diagnosis not present

## 2019-02-28 DIAGNOSIS — M62830 Muscle spasm of back: Secondary | ICD-10-CM

## 2019-02-28 DIAGNOSIS — G8929 Other chronic pain: Secondary | ICD-10-CM

## 2019-02-28 NOTE — Therapy (Signed)
[place], [place], [address] Phone: [phone]   Fax:  [phone]  Physical Therapy Treatment  Patient Details  Name: Carol Wilkins MRN: [id number] Date of Birth: 1956-03-10 Referring Provider (PT): Phylliss Bob   Encounter Date: 02/28/2019  PT End of Session - 02/28/19 1606    Visit Number  4    Number of Visits  13    Date for PT Re-Evaluation  03/27/19    Authorization Type  MCD: resubmit on 02/28/2019    Authorization Time Period  02/12/2019-03/04/2019    Authorization - Visit Number  3    Authorization - Number of Visits  3    PT Start Time  6387    PT Stop Time  5643    PT Time Calculation (min)  40 min    Activity Tolerance  Patient tolerated treatment well    Behavior During Therapy  [facility] for tasks assessed/performed       Past Medical History:  Diagnosis Date  . Anxiety attack   . Diabetes mellitus without complication ([place])    type 2 on metformin  . High cholesterol   . Hypertension     Past Surgical History:  Procedure Laterality Date  . ABDOMINAL HYSTERECTOMY     in early 90's  . CHOLECYSTECTOMY     in the 90's  . TEE WITHOUT CARDIOVERSION N/A 07/14/2015   Procedure: TRANSESOPHAGEAL ECHOCARDIOGRAM (TEE);  Surgeon: Adrian Prows, MD;  Location: [place];  Service: Cardiovascular;  Laterality: N/A;  . TUBAL LIGATION    . TUBAL LIGATION  1980    There were no vitals filed for this visit.  Subjective Assessment - 02/28/19 1607    Subjective  "I think the back is getting better some what"    Currently in Pain?  Yes    Pain Score  4     Pain Orientation  Left    Pain Descriptors / Indicators  Aching    Pain Type  Chronic pain    Pain Onset  More than a month ago    Pain Frequency  Intermittent    Aggravating Factors   any activity, house work , prolonged standing    Pain Relieving Factors  tylenol, exercises    Effect of Pain on Daily Activities  limite standing/  walking         [facility] PT Assessment - 02/28/19 0001      Assessment   Medical Diagnosis  Low back pain    Referring Provider (PT)  Elta Guadeloupe Dumonski      AROM   Lumbar Flexion  82    Lumbar Extension  20    Lumbar - Right Side Bend  10    Lumbar - Left Side Bend  10      Strength   Right Hip Flexion  4/5    Right Hip ABduction  4-/5    Left Hip Flexion  4/5    Left Hip Extension  4-/5    Left Hip ABduction  4-/5    Left Hip ADduction  4/5                   [facility] Adult PT Treatment/Exercise - 02/28/19 0001      Self-Care   Self-Care  Posture    Posture  proper posture in multiple positions and with lifting with associated handout      Lumbar Exercises: Stretches   Active Hamstring Stretch  3 reps;30 seconds  PNF contract/ relax   Lower Trunk Rotation  --   2 x 10     Lumbar Exercises: Aerobic   Nustep  L4 x 8 min UE/LE   educated posture during this time     Lumbar Exercises: Seated   Sit to Stand  5 reps      Lumbar Exercises: Supine   Dead Bug  5 reps   10 sec hold   Dead Bug Limitations  tactile cues for proper form      Lumbar Exercises: Sidelying   Other Sidelying Lumbar Exercises  balls squeeze while R sidelying 2 x 10 x 10 sec hold      Manual Therapy   Manual Therapy  Muscle Energy Technique    Joint Mobilization  LLE LAD grade III-IV    Soft tissue mobilization  IASTM along the glute med/min    Muscle Energy Technique  resisted L hip flexion 5 x 10, and scissor technique with R hip flexion/ extension               PT Short Term Goals - 02/28/19 1611      PT SHORT TERM GOAL #1   Title  pt to be I with inital HEP    Time  3    Period  Weeks    Status  Achieved      PT SHORT TERM GOAL #2   Title  pt to verbalize/ demo proper posture and lifting mechanics to reduce and prevent low back pain    Period  Weeks    Status  Achieved        PT Long Term Goals - 02/28/19 1612      PT LONG TERM GOAL #1   Title  pt to increase  bil trunk sidebending by >/= 8 degrees and maintain all trunk ROM with </= 2/10 pain for functional mobiltiy    Baseline  bil sidebending 10 degree    Period  Weeks    Status  On-going      PT LONG TERM GOAL #2   Title  increase L hip gross strength to >/=4+/5 to promote hip stability and reduce pain to </= 2/10    Baseline  L hip flexion, abduction/ extension 4-/5,    Period  Weeks    Status  On-going      PT LONG TERM GOAL #3   Title  pt to be able to walk / stand for >/= 60 min and perform sit to stand transitions with no limtations for functional endurance / mobility    Baseline  pain with prolonged sitting with transition to standing. able to walk stand ~15 min    Period  Weeks    Status  On-going      PT LONG TERM GOAL #4   Title  pt to be Ind with all HEP given as of last visit to maintain and progress current level of function ind    Baseline  independent with current HEP and progressing as able.    Period  Weeks    Status  On-going            Plan - 02/28/19 1649    Clinical Impression Statement  [name] reports improvement in pain in the back rated at 4/10 today, and reports consistency with her HEP. She is demonstrating improvement in trunk flexion but continues to demonstrated limited sidebending. She met all STG's today and is working toward her LTG's and is motivated to continue with  treatment. continued L hamstring stretching followed with hip flexor MET Techqniues. reviewed posture / lifting mechanics with associated handout. she would benefit from physical therapy to redoce low back pain, promote walking/ standing endurnace and maximize overall function.    PT Treatment/Interventions  ADLs/Self Care Home Management;Cryotherapy;Electrical Stimulation;Iontophoresis 71m/ml Dexamethasone;Moist Heat;Traction;Ultrasound;Therapeutic activities;Therapeutic exercise;Patient/family education;Manual techniques;Passive range of motion;Dry needling;Taping;Joint Manipulations     PT Next Visit Plan  review/ update HEP PRN, STW along L lumbar paraspinals, potential inflare on L SIJ, hip abductor STW, adductor activation, posture education, modalities PRN, response to DN.    PT Home Exercise Plan  lower trunk rotation, isometric hip adduction, hip abduction stretching, hamstring stretching (supine/ sitting), bridge, supine clam red band, posture handout    Consulted and Agree with Plan of Care  Patient       Patient will benefit from skilled therapeutic intervention in order to improve the following deficits and impairments:  Improper body mechanics, Postural dysfunction, Increased muscle spasms, Decreased strength, Pain, Impaired UE functional use, Impaired vision/preception, Decreased endurance, Decreased balance, Decreased activity tolerance  Visit Diagnosis: Chronic bilateral low back pain, unspecified whether sciatica present  Muscle spasm of back  Abnormal posture     Problem List Patient Active Problem List   Diagnosis Date Noted  . Low back pain 01/25/2012  . Abdominal pain 11/02/2011  . Obesity (BMI 30.0-34.9) 10/31/2011  . Post herpetic neuralgia 09/14/2011  . DEPRESSION 12/22/2006  . HYPERTENSION 12/22/2006  . ALLERGIC RHINITIS 12/22/2006  . PALPITATIONS, HX OF 12/22/2006   KStarr LakePT, DPT, LAT, ATC  02/28/19  4:54 PM      [facility] [address]Phone: [phone]  Fax:  [phone] Name: JLun MuroMRN: [id number] of Birth: [birth year]

## 2019-02-28 NOTE — Patient Instructions (Signed)

## 2019-03-11 ENCOUNTER — Other Ambulatory Visit: Payer: Self-pay

## 2019-03-11 ENCOUNTER — Ambulatory Visit: Payer: Medicaid Other | Admitting: Physical Therapy

## 2019-03-11 ENCOUNTER — Encounter: Payer: Self-pay | Admitting: Physical Therapy

## 2019-03-11 DIAGNOSIS — G8929 Other chronic pain: Secondary | ICD-10-CM

## 2019-03-11 DIAGNOSIS — M62830 Muscle spasm of back: Secondary | ICD-10-CM

## 2019-03-11 DIAGNOSIS — R293 Abnormal posture: Secondary | ICD-10-CM

## 2019-03-11 DIAGNOSIS — M545 Low back pain: Secondary | ICD-10-CM | POA: Diagnosis not present

## 2019-03-11 NOTE — Therapy (Signed)
[place], [place], [address] Phone: [phone]   Fax:  [phone]  Physical Therapy Treatment  Patient Details  Name: Carol Wilkins MRN: [id number] Date of Birth: [birth year] Referring Provider (PT): Phylliss Bob   Encounter Date: 03/11/2019  PT End of Session - 03/11/19 1311    Visit Number  5    Number of Visits  13    Date for PT Re-Evaluation  [id number]   per cert   Authorization Type  Medicaid    Authorization Time Period  03/11/19-04/14/19    Authorization - Visit Number  1    Authorization - Number of Visits  10    PT Start Time  0105    PT Stop Time  0150    PT Time Calculation (min)  45 min       Past Medical History:  Diagnosis Date  . Anxiety attack   . Diabetes mellitus without complication ([place])    type 2 on metformin  . High cholesterol   . Hypertension     Past Surgical History:  Procedure Laterality Date  . ABDOMINAL HYSTERECTOMY     in early 90's  . CHOLECYSTECTOMY     in the 90's  . TEE WITHOUT CARDIOVERSION N/A 07/14/2015   Procedure: TRANSESOPHAGEAL ECHOCARDIOGRAM (TEE);  Surgeon: Adrian Prows, MD;  Location: [place];  Service: Cardiovascular;  Laterality: N/A;  . TUBAL LIGATION    . TUBAL LIGATION  1980    There were no vitals filed for this visit.  Subjective Assessment - 03/11/19 1309    Subjective  I am slow today.    Currently in Pain?  Yes    Pain Score  6     Pain Location  Back    Pain Orientation  Left    Pain Descriptors / Indicators  Aching    Aggravating Factors   walking, feels unbalanced    Pain Relieving Factors  meds, exercise.                       [facility] Adult PT Treatment/Exercise - 03/11/19 0001      Lumbar Exercises: Stretches   Active Hamstring Stretch  2 reps;20 seconds    Active Hamstring Stretch Limitations  seated     Lower Trunk Rotation  --   2 x 10   Other Lumbar Stretch Exercise  slant board stretch x  1 min    Other Lumbar Stretch Exercise  seated physiball stretch for lumbar flexion.       Lumbar Exercises: Aerobic   Nustep  L4 x 9 min UE/LE   educated posture during this time     Lumbar Exercises: Standing   Heel Raises  10 reps    Other Standing Lumbar Exercises  standing 3 way hip x 10 each bilat       Lumbar Exercises: Seated   Sit to Stand  10 reps      Lumbar Exercises: Supine   Clam Limitations  green theraband 2 x 10    Heel Slides Limitations  with green band around thighs     Bent Knee Raise  20 reps    Bridge  10 reps    Bridge Limitations  continued glute and abd set during exercise    Straight Leg Raise  10 reps    Straight Leg Raises Limitations  cues for abdominal draw in       Manual Therapy   Joint  Mobilization  LLD nilat                PT Short Term Goals - 02/28/19 1611      PT SHORT TERM GOAL #1   Title  pt to be I with inital HEP    Time  3    Period  Weeks    Status  Achieved      PT SHORT TERM GOAL #2   Title  pt to verbalize/ demo proper posture and lifting mechanics to reduce and prevent low back pain    Period  Weeks    Status  Achieved        PT Long Term Goals - 02/28/19 1612      PT LONG TERM GOAL #1   Title  pt to increase bil trunk sidebending by >/= 8 degrees and maintain all trunk ROM with </= 2/10 pain for functional mobiltiy    Baseline  bil sidebending 10 degree    Period  Weeks    Status  On-going      PT LONG TERM GOAL #2   Title  increase L hip gross strength to >/=4+/5 to promote hip stability and reduce pain to </= 2/10    Baseline  L hip flexion, abduction/ extension 4-/5,    Period  Weeks    Status  On-going      PT LONG TERM GOAL #3   Title  pt to be able to walk / stand for >/= 60 min and perform sit to stand transitions with no limtations for functional endurance / mobility    Baseline  pain with prolonged sitting with transition to standing. able to walk stand ~15 min    Period  Weeks    Status   On-going      PT LONG TERM GOAL #4   Title  pt to be Ind with all HEP given as of last visit to maintain and progress current level of function ind    Baseline  independent with current HEP and progressing as able.    Period  Weeks    Status  On-going            Plan - 03/11/19 1401    Clinical Impression Statement  Jalan reports increased pain today. Began some closed chain exercise with pan in right thigh during SLS. Continued with mat strengthening for core and hip as tolerated.    PT Next Visit Plan  review/ update HEP PRN, STW along L lumbar paraspinals, potential inflare on L SIJ, hip abductor STW, adductor activation, posture education, modalities PRN, response to DN.    PT Home Exercise Plan  lower trunk rotation, isometric hip adduction, hip abduction stretching, hamstring stretching (supine/ sitting), bridge, supine clam red band, posture handout       Patient will benefit from skilled therapeutic intervention in order to improve the following deficits and impairments:  Improper body mechanics, Postural dysfunction, Increased muscle spasms, Decreased strength, Pain, Impaired UE functional use, Impaired vision/preception, Decreased endurance, Decreased balance, Decreased activity tolerance  Visit Diagnosis: Chronic bilateral low back pain, unspecified whether sciatica present  Muscle spasm of back  Abnormal posture     Problem List Patient Active Problem List   Diagnosis Date Noted  . Low back pain 01/25/2012  . Abdominal pain 11/02/2011  . Obesity (BMI 30.0-34.9) 10/31/2011  . Post herpetic neuralgia 09/14/2011  . DEPRESSION 12/22/2006  . HYPERTENSION 12/22/2006  . ALLERGIC RHINITIS 12/22/2006  . PALPITATIONS, HX OF 12/22/2006  Dorene Ar, [place] 03/11/2019, 2:02 PM  [facility] [address] [place], [place], [address] Phone: [phone]   Fax:  [phone]  Name: Carol Wilkins MRN:  [id number] Date of Birth: 1955-11-24

## 2019-03-12 ENCOUNTER — Ambulatory Visit: Payer: Medicaid Other | Admitting: Physical Therapy

## 2019-03-12 ENCOUNTER — Encounter: Payer: Self-pay | Admitting: Physical Therapy

## 2019-03-12 DIAGNOSIS — M62830 Muscle spasm of back: Secondary | ICD-10-CM

## 2019-03-12 DIAGNOSIS — M545 Low back pain: Secondary | ICD-10-CM | POA: Diagnosis not present

## 2019-03-12 DIAGNOSIS — G8929 Other chronic pain: Secondary | ICD-10-CM

## 2019-03-12 DIAGNOSIS — R293 Abnormal posture: Secondary | ICD-10-CM

## 2019-03-12 NOTE — Therapy (Signed)
[place], [place], [address] Phone: [phone]   Fax:  [phone]  Physical Therapy Treatment  Patient Details  Name: Carol Wilkins MRN: [id number] Date of Birth: [birth year] Referring Provider (PT): Phylliss Bob   Encounter Date: 03/12/2019  PT End of Session - 03/12/19 1231    Visit Number  6    Number of Visits  13    Date for PT Re-Evaluation  03/13/19    Authorization Type  Medicaid    Authorization Time Period  03/11/19-04/14/19    Authorization - Visit Number  2    Authorization - Number of Visits  10    PT Start Time  1226    PT Stop Time  1311    PT Time Calculation (min)  45 min       Past Medical History:  Diagnosis Date  . Anxiety attack   . Diabetes mellitus without complication ([place])    type 2 on metformin  . High cholesterol   . Hypertension     Past Surgical History:  Procedure Laterality Date  . ABDOMINAL HYSTERECTOMY     in early 90's  . CHOLECYSTECTOMY     in the 90's  . TEE WITHOUT CARDIOVERSION N/A 07/14/2015   Procedure: TRANSESOPHAGEAL ECHOCARDIOGRAM (TEE);  Surgeon: Adrian Prows, MD;  Location: [place];  Service: Cardiovascular;  Laterality: N/A;  . TUBAL LIGATION    . TUBAL LIGATION  1980    There were no vitals filed for this visit.  Subjective Assessment - 03/12/19 1230    Subjective  I have been doing stuff today so I am hurting. I had to take my family member to the store this morning.    Currently in Pain?  Yes    Pain Score  10-Worst pain ever   declined EMS   Pain Location  Back    Pain Orientation  Left;Mid    Pain Descriptors / Indicators  Aching    Pain Type  Chronic pain                       [facility] Adult PT Treatment/Exercise - 03/12/19 0001      Lumbar Exercises: Stretches   Active Hamstring Stretch  2 reps;20 seconds    Active Hamstring Stretch Limitations  seated     Single Knee to Chest Stretch  2 reps;20  seconds    Lower Trunk Rotation  --   2 x 10   Piriformis Stretch  2 reps;20 seconds    Other Lumbar Stretch Exercise  slant board stretch x 1 min      Lumbar Exercises: Aerobic   Nustep  L4 x 5 minutes  UE/LE       Lumbar Exercises: Standing   Heel Raises  10 reps    Other Standing Lumbar Exercises  standing 3 way hip x 10 each bilat       Lumbar Exercises: Seated   Sit to Stand  10 reps      Lumbar Exercises: Supine   Clam Limitations  green theraband 2 x 10    Bent Knee Raise  20 reps    Bent Knee Raise Limitations  with green band around thighs     Bridge  10 reps    Bridge Limitations  continued glute and abd set during exercise    Straight Leg Raise  10 reps    Straight Leg Raises Limitations  cues for abdominal draw in  Manual Therapy   Manual therapy comments  masage roller to posteriolateral left hip/low back               PT Short Term Goals - 02/28/19 1611      PT SHORT TERM GOAL #1   Title  pt to be I with inital HEP    Time  3    Period  Weeks    Status  Achieved      PT SHORT TERM GOAL #2   Title  pt to verbalize/ demo proper posture and lifting mechanics to reduce and prevent low back pain    Period  Weeks    Status  Achieved        PT Long Term Goals - 02/28/19 1612      PT LONG TERM GOAL #1   Title  pt to increase bil trunk sidebending by >/= 8 degrees and maintain all trunk ROM with </= 2/10 pain for functional mobiltiy    Baseline  bil sidebending 10 degree    Period  Weeks    Status  On-going      PT LONG TERM GOAL #2   Title  increase L hip gross strength to >/=4+/5 to promote hip stability and reduce pain to </= 2/10    Baseline  L hip flexion, abduction/ extension 4-/5,    Period  Weeks    Status  On-going      PT LONG TERM GOAL #3   Title  pt to be able to walk / stand for >/= 60 min and perform sit to stand transitions with no limtations for functional endurance / mobility    Baseline  pain with prolonged sitting with  transition to standing. able to walk stand ~15 min    Period  Weeks    Status  On-going      PT LONG TERM GOAL #4   Title  pt to be Ind with all HEP given as of last visit to maintain and progress current level of function ind    Baseline  independent with current HEP and progressing as able.    Period  Weeks    Status  On-going            Plan - 03/12/19 1248    Clinical Impression Statement  Pt arrives with no signs of distress. She reports 10/10 pain and declines EMS. Continued with gentle open and closed chain strengthening and ROM. Increased pain reported when lying hooklying, she notes a feeling of a "Knot" at left Lumbar. Massage roller used on lateral posterior hip in side lying to decrease tension and pain. AFterward she report feeling okay and a little stiff.    PT Next Visit Plan  review/ update HEP PRN, STW along L lumbar paraspinals, potential inflare on L SIJ, hip abductor STW, adductor activation, posture education, modalities PRN, response to DN.    PT Home Exercise Plan  lower trunk rotation, isometric hip adduction, hip abduction stretching, hamstring stretching (supine/ sitting), bridge, supine clam red band, posture handout       Patient will benefit from skilled therapeutic intervention in order to improve the following deficits and impairments:  Improper body mechanics, Postural dysfunction, Increased muscle spasms, Decreased strength, Pain, Impaired UE functional use, Impaired vision/preception, Decreased endurance, Decreased balance, Decreased activity tolerance  Visit Diagnosis: Chronic bilateral low back pain, unspecified whether sciatica present  Muscle spasm of back  Abnormal posture     Problem List Patient Active Problem List  Diagnosis Date Noted  . Low back pain 01/25/2012  . Abdominal pain 11/02/2011  . Obesity (BMI 30.0-34.9) 10/31/2011  . Post herpetic neuralgia 09/14/2011  . DEPRESSION 12/22/2006  . HYPERTENSION 12/22/2006  . ALLERGIC  RHINITIS 12/22/2006  . PALPITATIONS, HX OF 12/22/2006    Dorene Ar, PTA 03/12/2019, 1:13 PM  [facility] [address] [place], [place], [address] Phone: [phone]   Fax:  [phone]  Name: Gessica Kahler MRN: [id number] Date of Birth: February 17, 1956

## 2019-03-19 ENCOUNTER — Other Ambulatory Visit: Payer: Self-pay

## 2019-03-19 ENCOUNTER — Encounter: Payer: Self-pay | Admitting: Physical Therapy

## 2019-03-19 ENCOUNTER — Ambulatory Visit: Payer: Medicaid Other | Attending: Orthopedic Surgery | Admitting: Physical Therapy

## 2019-03-19 DIAGNOSIS — M62830 Muscle spasm of back: Secondary | ICD-10-CM

## 2019-03-19 DIAGNOSIS — M545 Low back pain: Secondary | ICD-10-CM | POA: Diagnosis not present

## 2019-03-19 DIAGNOSIS — R293 Abnormal posture: Secondary | ICD-10-CM | POA: Insufficient documentation

## 2019-03-19 DIAGNOSIS — G8929 Other chronic pain: Secondary | ICD-10-CM

## 2019-03-19 NOTE — Therapy (Signed)
[place], [place], [address] Phone: [phone]   Fax:  [phone]  Physical Therapy Treatment / Re-certification  Patient Details  Name: Carol Wilkins MRN: [id number] Date of Birth: October 29, 1955 Referring Provider (PT): Phylliss Bob   Encounter Date: 03/19/2019  PT End of Session - 03/19/19 1500    Visit Number  7    Number of Visits  13    Date for PT Re-Evaluation  04/04/19    Authorization Type  Medicaid    Authorization Time Period  03/11/19-04/14/19    Authorization - Visit Number  3    Authorization - Number of Visits  10    PT Start Time  1500    PT Stop Time  1541    PT Time Calculation (min)  41 min    Activity Tolerance  Patient tolerated treatment well    Behavior During Therapy  [facility] for tasks assessed/performed       Past Medical History:  Diagnosis Date  . Anxiety attack   . Diabetes mellitus without complication ([place])    type 2 on metformin  . High cholesterol   . Hypertension     Past Surgical History:  Procedure Laterality Date  . ABDOMINAL HYSTERECTOMY     in early 90's  . CHOLECYSTECTOMY     in the 90's  . TEE WITHOUT CARDIOVERSION N/A 07/14/2015   Procedure: TRANSESOPHAGEAL ECHOCARDIOGRAM (TEE);  Surgeon: Adrian Prows, MD;  Location: [place];  Service: Cardiovascular;  Laterality: N/A;  . TUBAL LIGATION    . TUBAL LIGATION  1980    There were no vitals filed for this visit.  Subjective Assessment - 03/19/19 1500    Subjective  "I have my ups and downs, I am okay as long as I am not doing anything"    Currently in Pain?  Yes    Pain Score  0-No pain   at worst 7-8/10   Pain Location  Back    Pain Orientation  Left;Mid    Pain Descriptors / Indicators  Aching    Pain Type  Chronic pain    Pain Onset  More than a month ago    Pain Frequency  Intermittent    Aggravating Factors   walking, feeling off balance, house activities    Pain Relieving Factors   meds, resting,         [facility] PT Assessment - 03/19/19 0001      Assessment   Medical Diagnosis  Low back pain    Referring Provider (PT)  Phylliss Bob    Onset Date/Surgical Date  --    make on PRN   Hand Dominance  Right      AROM   Lumbar Flexion  68    Lumbar Extension  18    Lumbar - Right Side Bend  13    Lumbar - Left Side Bend  13      Special Tests    Special Tests  Leg LengthTest    Leg length test   True;Apparent      True   Right  82.3 in.    Left   81.3 in.      Apparent   Right  97.1 in.    Left  97.1 in.                   [facility] Adult PT Treatment/Exercise - 03/19/19 0001      Self-Care   Self-Care  Other  Self-Care Comments    Other Self-Care Comments   benefits of heel lift in the L shoe       Lumbar Exercises: Stretches   Active Hamstring Stretch  2 reps;20 seconds    Single Knee to Chest Stretch  2 reps;20 seconds      Lumbar Exercises: Seated   Sit to Stand  10 reps             PT Education - 03/19/19 1527    Education Details  assessment findings regarding LLD and benefits of heel lift in the L shoe. benefits of SI belt to promote stability and where she can find one    Person(s) Educated  Patient    Methods  Explanation;Verbal cues    Comprehension  Verbalized understanding;Verbal cues required       PT Short Term Goals - 02/28/19 1611      PT SHORT TERM GOAL #1   Title  pt to be I with inital HEP    Time  3    Period  Weeks    Status  Achieved      PT SHORT TERM GOAL #2   Title  pt to verbalize/ demo proper posture and lifting mechanics to reduce and prevent low back pain    Period  Weeks    Status  Achieved        PT Long Term Goals - 03/19/19 1512      PT LONG TERM GOAL #1   Title  pt to increase bil trunk sidebending by >/= 8 degrees and maintain all trunk ROM with </= 2/10 pain for functional mobiltiy    Period  Weeks    Status  On-going      PT LONG TERM GOAL #2   Title  increase L hip gross  strength to >/=4+/5 to promote hip stability and reduce pain to </= 2/10    Period  Weeks    Status  On-going      PT LONG TERM GOAL #3   Title  pt to be able to walk / stand for >/= 60 min and perform sit to stand transitions with no limtations for functional endurance / mobility    Period  Weeks    Status  On-going      PT LONG TERM GOAL #4   Title  pt to be Ind with all HEP given as of last visit to maintain and progress current level of function ind    Status  On-going            Plan - 03/19/19 1541    Clinical Impression Statement  pt reports continued flucuating pain and reporting no pain today but notes she hasn't done much today. focused on reassessment and education which she demosntrates LLD with the LLE being shorting. provided a heel lift in the L shoe which she reported decreased pain. trialed SI Belt to promote stability which she noted significant improvement. plan to continue current treatment plan to work on decreasing low back pain, promote strength and stability of the SIJ on the L and maximize her function by addressing the deficits listed.    PT Next Visit Plan  update HEP PRN, STW along L lumbar paraspinals, potential inflare on L SIJ, hip abductor STW, adductor activation, posture education, modalities PRN, response to DN. how is heel lift, did she get an SI belt.    PT Home Exercise Plan  lower trunk rotation, isometric hip adduction, hip abduction stretching, hamstring stretching (  supine/ sitting), bridge, supine clam red band, posture handout    Consulted and Agree with Plan of Care  Patient       Patient will benefit from skilled therapeutic intervention in order to improve the following deficits and impairments:  Improper body mechanics, Postural dysfunction, Increased muscle spasms, Decreased strength, Pain, Impaired UE functional use, Impaired vision/preception, Decreased endurance, Decreased balance, Decreased activity tolerance  Visit Diagnosis: Chronic  bilateral low back pain, unspecified whether sciatica present  Muscle spasm of back  Abnormal posture     Problem List Patient Active Problem List   Diagnosis Date Noted  . Low back pain 01/25/2012  . Abdominal pain 11/02/2011  . Obesity (BMI 30.0-34.9) 10/31/2011  . Post herpetic neuralgia 09/14/2011  . DEPRESSION 12/22/2006  . HYPERTENSION 12/22/2006  . ALLERGIC RHINITIS 12/22/2006  . PALPITATIONS, HX OF 12/22/2006    Starr Lake PT, DPT, LAT, ATC  03/19/19  3:45 PM      [place] [facility] [address] [place], [place], [address] Phone: [phone]   Fax:  [phone]  Name: Shantoria Delmonico MRN: [id number] Date of Birth: March 17, 1956

## 2019-03-21 ENCOUNTER — Ambulatory Visit: Payer: Medicaid Other | Admitting: Physical Therapy

## 2019-03-21 ENCOUNTER — Other Ambulatory Visit: Payer: Self-pay

## 2019-03-21 DIAGNOSIS — G8929 Other chronic pain: Secondary | ICD-10-CM

## 2019-03-21 DIAGNOSIS — M62830 Muscle spasm of back: Secondary | ICD-10-CM

## 2019-03-21 DIAGNOSIS — M545 Low back pain: Secondary | ICD-10-CM | POA: Diagnosis not present

## 2019-03-21 DIAGNOSIS — R293 Abnormal posture: Secondary | ICD-10-CM

## 2019-03-21 NOTE — Therapy (Signed)
[place], [place], [address] Phone: [phone]   Fax:  [phone]  Physical Therapy Treatment  Patient Details  Name: Carol Wilkins MRN: [id number] Date of Birth: [birth year] Referring Provider (PT): Phylliss Bob   Encounter Date: 03/21/2019  PT End of Session - 03/21/19 1423    Visit Number  8    Number of Visits  13    Date for PT Re-Evaluation  04/04/19    Authorization Type  Medicaid    Authorization Time Period  03/11/19-04/14/19    Authorization - Visit Number  4    Authorization - Number of Visits  10       Past Medical History:  Diagnosis Date  . Anxiety attack   . Diabetes mellitus without complication ([place])    type 2 on metformin  . High cholesterol   . Hypertension     Past Surgical History:  Procedure Laterality Date  . ABDOMINAL HYSTERECTOMY     in early 90's  . CHOLECYSTECTOMY     in the 90's  . TEE WITHOUT CARDIOVERSION N/A 07/14/2015   Procedure: TRANSESOPHAGEAL ECHOCARDIOGRAM (TEE);  Surgeon: Adrian Prows, MD;  Location: [place];  Service: Cardiovascular;  Laterality: N/A;  . TUBAL LIGATION    . TUBAL LIGATION  1980    There were no vitals filed for this visit.  Subjective Assessment - 03/21/19 1422    Subjective  " I feel a little better today, I am still getting used to the heel lift"    Patient Stated Goals  to decrease pain, return to daily living activites    Currently in Pain?  Yes    Pain Score  3     Pain Location  Back         [facility] PT Assessment - 03/21/19 0001      Assessment   Medical Diagnosis  Low back pain    Referring Provider (PT)  Phylliss Bob    Hand Dominance  Right                   [facility] Adult PT Treatment/Exercise - 03/21/19 0001      Lumbar Exercises: Stretches   Active Hamstring Stretch  2 reps;20 seconds    Single Knee to Chest Stretch  2 reps;20 seconds      Lumbar Exercises: Aerobic   Nustep  L5 x 5 min  UE/LE   while wearing SI belt     Lumbar Exercises: Standing   Heel Raises  20 reps    Other Standing Lumbar Exercises  hip abduction / extension bil 2 x 15 ea.   pushing down throughou Bil UE onto physioball 2 x 10 5'   Other Standing Lumbar Exercises  wall sit position  2 x 5 with  10 sec ball squeeze               PT Short Term Goals - 02/28/19 1611      PT SHORT TERM GOAL #1   Title  pt to be I with inital HEP    Time  3    Period  Weeks    Status  Achieved      PT SHORT TERM GOAL #2   Title  pt to verbalize/ demo proper posture and lifting mechanics to reduce and prevent low back pain    Period  Weeks    Status  Achieved        PT Long Term Goals -  03/19/19 1512      PT LONG TERM GOAL #1   Title  pt to increase bil trunk sidebending by >/= 8 degrees and maintain all trunk ROM with </= 2/10 pain for functional mobiltiy    Period  Weeks    Status  On-going      PT LONG TERM GOAL #2   Title  increase L hip gross strength to >/=4+/5 to promote hip stability and reduce pain to </= 2/10    Period  Weeks    Status  On-going      PT LONG TERM GOAL #3   Title  pt to be able to walk / stand for >/= 60 min and perform sit to stand transitions with no limtations for functional endurance / mobility    Period  Weeks    Status  On-going      PT LONG TERM GOAL #4   Title  pt to be Ind with all HEP given as of last visit to maintain and progress current level of function ind    Status  On-going            Plan - 03/21/19 1457    Clinical Impression Statement  pt reports she feels a small difference with the heel lift in her shoe. focused session on hip and core strengthening but utilized SI belt throughout session to promote stability which she reported relief of pain but does note fatigue. worked on prolonged standing throughout session utilzing standing rest breaks.    PT Next Visit Plan  update HEP PRN, STW along L lumbar paraspinals, potential inflare on L  SIJ, hip abductor STW, adductor activation, posture education, modalities PRN, response to DN. how is heel lift, did she get an SI belt.    PT Home Exercise Plan  lower trunk rotation, isometric hip adduction, hip abduction stretching, hamstring stretching (supine/ sitting), bridge, supine clam red band, posture handout    Consulted and Agree with Plan of Care  Patient       Patient will benefit from skilled therapeutic intervention in order to improve the following deficits and impairments:  Improper body mechanics, Postural dysfunction, Increased muscle spasms, Decreased strength, Pain, Impaired UE functional use, Impaired vision/preception, Decreased endurance, Decreased balance, Decreased activity tolerance  Visit Diagnosis: Chronic bilateral low back pain, unspecified whether sciatica present  Muscle spasm of back  Abnormal posture     Problem List Patient Active Problem List   Diagnosis Date Noted  . Low back pain 01/25/2012  . Abdominal pain 11/02/2011  . Obesity (BMI 30.0-34.9) 10/31/2011  . Post herpetic neuralgia 09/14/2011  . DEPRESSION 12/22/2006  . HYPERTENSION 12/22/2006  . ALLERGIC RHINITIS 12/22/2006  . PALPITATIONS, HX OF 12/22/2006   Starr Lake PT, DPT, LAT, ATC  03/21/19  3:07 PM      [place] [address] [place], [place], [address] Phone: [phone]   Fax:  [phone]  Name: Carol Wilkins MRN: [id number] Date of Birth: 02/11/1956

## 2019-03-26 ENCOUNTER — Ambulatory Visit: Payer: Medicaid Other | Admitting: Physical Therapy

## 2019-03-26 ENCOUNTER — Other Ambulatory Visit: Payer: Self-pay

## 2019-03-26 DIAGNOSIS — M62830 Muscle spasm of back: Secondary | ICD-10-CM

## 2019-03-26 DIAGNOSIS — R293 Abnormal posture: Secondary | ICD-10-CM

## 2019-03-26 DIAGNOSIS — M545 Low back pain: Secondary | ICD-10-CM | POA: Diagnosis not present

## 2019-03-26 DIAGNOSIS — G8929 Other chronic pain: Secondary | ICD-10-CM

## 2019-03-26 NOTE — Therapy (Signed)
[place], [place], [address] Phone: [phone]   Fax:  [phone]  Physical Therapy Treatment  Patient Details  Name: Carol Wilkins MRN: [id number] Date of Birth: 08-Aug-1955 Referring Provider (PT): Phylliss Bob   Encounter Date: 03/26/2019  PT End of Session - 03/26/19 1502    Visit Number  9    Number of Visits  13    Date for PT Re-Evaluation  04/04/19    Authorization Type  Medicaid    Authorization Time Period  03/11/19-04/14/19    Authorization - Visit Number  5    Authorization - Number of Visits  10    PT Start Time  1502    PT Stop Time  1540    PT Time Calculation (min)  38 min    Activity Tolerance  Patient tolerated treatment well    Behavior During Therapy  [facility] for tasks assessed/performed       Past Medical History:  Diagnosis Date  . Anxiety attack   . Diabetes mellitus without complication ([place])    type 2 on metformin  . High cholesterol   . Hypertension     Past Surgical History:  Procedure Laterality Date  . ABDOMINAL HYSTERECTOMY     in early 90's  . CHOLECYSTECTOMY     in the 90's  . TEE WITHOUT CARDIOVERSION N/A 07/14/2015   Procedure: TRANSESOPHAGEAL ECHOCARDIOGRAM (TEE);  Surgeon: Adrian Prows, MD;  Location: [place];  Service: Cardiovascular;  Laterality: N/A;  . TUBAL LIGATION    . TUBAL LIGATION  1980    There were no vitals filed for this visit.  Subjective Assessment - 03/26/19 1504    Subjective  "I am feeling okay. I think it is gradually getting better"    Patient Stated Goals  to decrease pain, return to daily living activites    Currently in Pain?  Yes    Pain Score  3     Pain Orientation  Left;Mid    Pain Descriptors / Indicators  Aching    Pain Type  Chronic pain    Pain Onset  More than a month ago    Pain Frequency  Intermittent    Aggravating Factors   walking/ standing, house hold activities    Pain Relieving Factors  meds,  resting,                       [facility] Adult PT Treatment/Exercise - 03/26/19 0001      Lumbar Exercises: Stretches   Hip Flexor Stretch  2 reps;Left;30 seconds    Quadruped Mid Back Stretch  2 reps;30 seconds   seated walking hands down the legs     Lumbar Exercises: Aerobic   Tread Mill  L 1.2 x 5 min      Lumbar Exercises: Standing   Other Standing Lumbar Exercises  hip abduction / extension bil 2 x 15 ea.    Other Standing Lumbar Exercises  squats holding onto the sink 2 x 15      Lumbar Exercises: Seated   Other Seated Lumbar Exercises  posterior pelvic tilt 2 x 10, horizontal lift/ chop 2x 10 with red theraband bil   seated on green physioball              PT Short Term Goals - 02/28/19 1611      PT SHORT TERM GOAL #1   Title  pt to be I with inital HEP  Time  3    Period  Weeks    Status  Achieved      PT SHORT TERM GOAL #2   Title  pt to verbalize/ demo proper posture and lifting mechanics to reduce and prevent low back pain    Period  Weeks    Status  Achieved        PT Long Term Goals - 03/19/19 1512      PT LONG TERM GOAL #1   Title  pt to increase bil trunk sidebending by >/= 8 degrees and maintain all trunk ROM with </= 2/10 pain for functional mobiltiy    Period  Weeks    Status  On-going      PT LONG TERM GOAL #2   Title  increase L hip gross strength to >/=4+/5 to promote hip stability and reduce pain to </= 2/10    Period  Weeks    Status  On-going      PT LONG TERM GOAL #3   Title  pt to be able to walk / stand for >/= 60 min and perform sit to stand transitions with no limtations for functional endurance / mobility    Period  Weeks    Status  On-going      PT LONG TERM GOAL #4   Title  pt to be Ind with all HEP given as of last visit to maintain and progress current level of function ind    Status  On-going            Plan - 03/26/19 1541    Clinical Impression Statement  contiued working on [employer] utilzing treadmill to work walking endurnace which she does fatigue quickly at around 5 min. Upgraded heel lift to promote increased height which she noted some relief of pain. continued hip strengthening in standing and core strengthing/ stabilizatoin using green physioball    PT Treatment/Interventions  ADLs/Self Care Home Management;Cryotherapy;Electrical Stimulation;Iontophoresis 4mg /ml Dexamethasone;Moist Heat;Traction;Ultrasound;Therapeutic activities;Therapeutic exercise;Patient/family education;Manual techniques;Passive range of motion;Dry needling;Taping;Joint Manipulations    PT Next Visit Plan  update HEP PRN, STW along L lumbar paraspinals, potential inflare on L SIJ, hip abductor STW, adductor activation, posture education, modalities PRN, response to DN. how is heel lift, did she get an SI belt.    PT Home Exercise Plan  lower trunk rotation, isometric hip adduction, hip abduction stretching, hamstring stretching (supine/ sitting), bridge, supine clam red band, posture handout    Consulted and Agree with Plan of Care  Patient       Patient will benefit from skilled therapeutic intervention in order to improve the following deficits and impairments:  Improper body mechanics, Postural dysfunction, Increased muscle spasms, Decreased strength, Pain, Impaired UE functional use, Impaired vision/preception, Decreased endurance, Decreased balance, Decreased activity tolerance  Visit Diagnosis: Chronic bilateral low back pain, unspecified whether sciatica present  Muscle spasm of back  Abnormal posture     Problem List Patient Active Problem List   Diagnosis Date Noted  . Low back pain 01/25/2012  . Abdominal pain 11/02/2011  . Obesity (BMI 30.0-34.9) 10/31/2011  . Post herpetic neuralgia 09/14/2011  . DEPRESSION 12/22/2006  . HYPERTENSION 12/22/2006  . ALLERGIC RHINITIS 12/22/2006  . PALPITATIONS, HX OF 12/22/2006   Starr Lake PT, DPT, LAT, ATC  03/26/19  3:44  PM      [facility] [facility] [address] [place], [place], [address] Phone: [phone]   Fax:  [phone]  Name: Dajanae Muccino MRN: [id number]  Date of Birth: [birth year]

## 2019-03-28 ENCOUNTER — Encounter: Payer: Self-pay | Admitting: Physical Therapy

## 2019-03-28 ENCOUNTER — Other Ambulatory Visit: Payer: Self-pay

## 2019-03-28 ENCOUNTER — Ambulatory Visit: Payer: Medicaid Other | Admitting: Physical Therapy

## 2019-03-28 DIAGNOSIS — G8929 Other chronic pain: Secondary | ICD-10-CM

## 2019-03-28 DIAGNOSIS — M545 Low back pain, unspecified: Secondary | ICD-10-CM

## 2019-03-28 DIAGNOSIS — R293 Abnormal posture: Secondary | ICD-10-CM

## 2019-03-28 DIAGNOSIS — M62830 Muscle spasm of back: Secondary | ICD-10-CM

## 2019-03-28 NOTE — Therapy (Signed)
[place], [place], [address] Phone: [phone]   Fax:  [phone]  Physical Therapy Treatment  Patient Details  Name: Carol Wilkins MRN: [id number] Date of Birth: [birth year] Referring Provider (PT): Phylliss Bob   Encounter Date: 03/28/2019  PT End of Session - 03/28/19 1402    Visit Number  10    Number of Visits  13    Date for PT Re-Evaluation  04/04/19    Authorization Type  Medicaid    Authorization Time Period  03/11/19-04/14/19    Authorization - Visit Number  6    Authorization - Number of Visits  10    PT Start Time  [id number]    PT Stop Time  1455    PT Time Calculation (min)  48 min    Activity Tolerance  Patient tolerated treatment well    Behavior During Therapy  [facility] for tasks assessed/performed       Past Medical History:  Diagnosis Date  . Anxiety attack   . Diabetes mellitus without complication ([place])    type 2 on metformin  . High cholesterol   . Hypertension     Past Surgical History:  Procedure Laterality Date  . ABDOMINAL HYSTERECTOMY     in early 90's  . CHOLECYSTECTOMY     in the 90's  . TEE WITHOUT CARDIOVERSION N/A 07/14/2015   Procedure: TRANSESOPHAGEAL ECHOCARDIOGRAM (TEE);  Surgeon: Adrian Prows, MD;  Location: [place];  Service: Cardiovascular;  Laterality: N/A;  . TUBAL LIGATION    . TUBAL LIGATION  1980    There were no vitals filed for this visit.  Subjective Assessment - 03/28/19 1403    Subjective  "I did have a rough day the other day while I was at [employer] and noticed increased soreness in the L low back"    Patient Stated Goals  to decrease pain, return to daily living activites    Currently in Pain?  Yes    Pain Score  4     Pain Location  Back    Pain Orientation  Left    Pain Type  Chronic pain    Pain Onset  More than a month ago    Pain Frequency  Intermittent    Aggravating Factors   walking/ standing, house hold activities                       [facility] Adult PT Treatment/Exercise - 03/28/19 0001      Lumbar Exercises: Stretches   Active Hamstring Stretch  2 reps;20 seconds    Hip Flexor Stretch  2 reps;Left;30 seconds    Quadruped Mid Back Stretch  2 reps;30 seconds      Lumbar Exercises: Aerobic   Tread Mill  L 1.2 x 5 min    Nustep  L5 x 4 min UE/LE      Lumbar Exercises: Sidelying   Other Sidelying Lumbar Exercises  L hip ER in L sidelying 2 x 10   cues for proper form     Manual Therapy   Manual therapy comments  skilled palpation and monitoring of pt throughout TPDN    Joint Mobilization  LLE LAD grade V, L1-L5 grade III PA    Soft tissue mobilization  IASTM and fascial rolling along the L lumbar paraspinals       Trigger Point Dry Needling - 03/28/19 0001    Muscles Treated Back/Hip  Gluteus medius;Erector spinae  Gluteus Medius Response  Twitch response elicited;Palpable increased muscle length   L    Erector spinae Response  Twitch response elicited;Palpable increased muscle length   L L5-L2          PT Education - 03/28/19 1458    Education Details  updated HEP for L hip external rotation strengthing, and R piriformis stretching. reviewed low back stretch using physioball    Person(s) Educated  Patient;Parent(s)    Methods  Explanation;Verbal cues    Comprehension  Verbalized understanding;Verbal cues required       PT Short Term Goals - 02/28/19 1611      PT SHORT TERM GOAL #1   Title  pt to be I with inital HEP    Time  3    Period  Weeks    Status  Achieved      PT SHORT TERM GOAL #2   Title  pt to verbalize/ demo proper posture and lifting mechanics to reduce and prevent low back pain    Period  Weeks    Status  Achieved        PT Long Term Goals - 03/19/19 1512      PT LONG TERM GOAL #1   Title  pt to increase bil trunk sidebending by >/= 8 degrees and maintain all trunk ROM with </= 2/10 pain for functional mobiltiy    Period  Weeks    Status   On-going      PT LONG TERM GOAL #2   Title  increase L hip gross strength to >/=4+/5 to promote hip stability and reduce pain to </= 2/10    Period  Weeks    Status  On-going      PT LONG TERM GOAL #3   Title  pt to be able to walk / stand for >/= 60 min and perform sit to stand transitions with no limtations for functional endurance / mobility    Period  Weeks    Status  On-going      PT LONG TERM GOAL #4   Title  pt to be Ind with all HEP given as of last visit to maintain and progress current level of function ind    Status  On-going            Plan - 03/28/19 1424    Clinical Impression Statement  pt arrived reporting 4/10 pain today and continues to note increased soreness with walking/ standing. continued TPDN focusing on the L lumbar paraspinals and l glute med/min followed with IASTM techniques. continued working hip strengthening to promote stability. continued working strengtheing focusing on L hip external rotators and stretching R ER's. pt reported pain dropped to 0/10 end of session.    PT Treatment/Interventions  ADLs/Self Care Home Management;Cryotherapy;Electrical Stimulation;Iontophoresis 4mg /ml Dexamethasone;Moist Heat;Traction;Ultrasound;Therapeutic activities;Therapeutic exercise;Patient/family education;Manual techniques;Passive range of motion;Dry needling;Taping;Joint Manipulations    PT Next Visit Plan  update HEP PRN, STW along L lumbar paraspinals, potential inflare on L SIJ, hip abductor STW, adductor activation, posture education, modalities PRN, response to DN.    PT Home Exercise Plan  lower trunk rotation, isometric hip adduction, hip abduction stretching, hamstring stretching (supine/ sitting), bridge, supine clam red band, posture handout, hip ER on L, piriformis stretching on the R    Consulted and Agree with Plan of Care  Patient       Patient will benefit from skilled therapeutic intervention in order to improve the following deficits and  impairments:  Improper body mechanics, Postural dysfunction,  Increased muscle spasms, Decreased strength, Pain, Impaired UE functional use, Impaired vision/preception, Decreased endurance, Decreased balance, Decreased activity tolerance  Visit Diagnosis: Chronic bilateral low back pain, unspecified whether sciatica present  Muscle spasm of back  Abnormal posture     Problem List Patient Active Problem List   Diagnosis Date Noted  . Low back pain 01/25/2012  . Abdominal pain 11/02/2011  . Obesity (BMI 30.0-34.9) 10/31/2011  . Post herpetic neuralgia 09/14/2011  . DEPRESSION 12/22/2006  . HYPERTENSION 12/22/2006  . ALLERGIC RHINITIS 12/22/2006  . PALPITATIONS, HX OF 12/22/2006   Starr Lake PT, DPT, LAT, ATC  03/28/19  3:12 PM      [place] [facility] [address] [place], [place], [address] Phone: [phone]   Fax:  [phone]  Name: Bhavani Chiaramonte MRN: [id number] Date of Birth: June 19, 1955

## 2019-04-02 ENCOUNTER — Ambulatory Visit: Payer: Medicaid Other | Admitting: Physical Therapy

## 2019-04-02 ENCOUNTER — Encounter: Payer: Self-pay | Admitting: Physical Therapy

## 2019-04-02 ENCOUNTER — Other Ambulatory Visit: Payer: Self-pay

## 2019-04-02 DIAGNOSIS — M62830 Muscle spasm of back: Secondary | ICD-10-CM

## 2019-04-02 DIAGNOSIS — G8929 Other chronic pain: Secondary | ICD-10-CM

## 2019-04-02 DIAGNOSIS — R293 Abnormal posture: Secondary | ICD-10-CM

## 2019-04-02 DIAGNOSIS — M545 Low back pain: Secondary | ICD-10-CM | POA: Diagnosis not present

## 2019-04-02 NOTE — Therapy (Signed)
[facility] [place], [place], [address] Phone: [phone]   Fax:  [phone]  Physical Therapy Treatment  Patient Details  Name: Carol Wilkins MRN: [id number] Date of Birth: 24-Apr-1955 Referring Provider (PT): Phylliss Bob   Encounter Date: 04/02/2019  PT End of Session - 04/02/19 1458    Visit Number  11    Number of Visits  13    Date for PT Re-Evaluation  04/04/19    Authorization Type  Medicaid    Authorization Time Period  03/11/19-04/14/19    Authorization - Visit Number  7    Authorization - Number of Visits  10    PT Start Time  [id number]    PT Stop Time  1544    PT Time Calculation (min)  46 min    Activity Tolerance  Patient tolerated treatment well    Behavior During Therapy  [facility] for tasks assessed/performed       Past Medical History:  Diagnosis Date  . Anxiety attack   . Diabetes mellitus without complication ([place])    type 2 on metformin  . High cholesterol   . Hypertension     Past Surgical History:  Procedure Laterality Date  . ABDOMINAL HYSTERECTOMY     in early 90's  . CHOLECYSTECTOMY     in the 90's  . TEE WITHOUT CARDIOVERSION N/A 07/14/2015   Procedure: TRANSESOPHAGEAL ECHOCARDIOGRAM (TEE);  Surgeon: Adrian Prows, MD;  Location: [place];  Service: Cardiovascular;  Laterality: N/A;  . TUBAL LIGATION    . TUBAL LIGATION  1980    There were no vitals filed for this visit.  Subjective Assessment - 04/02/19 1459    Subjective  "Still alittle sore in the back. The DN helped alittle but the pain seems to come back"    Patient Stated Goals  to decrease pain, return to daily living activites    Currently in Pain?  Yes    Pain Score  5     Pain Orientation  Left    Pain Descriptors / Indicators  Aching    Pain Type  Chronic pain    Pain Onset  More than a month ago    Pain Frequency  Intermittent         [facility] PT Assessment - 04/02/19 0001      Assessment   Medical  Diagnosis  Low back pain    Referring Provider (PT)  Phylliss Bob    Hand Dominance  Right                   [facility] Adult PT Treatment/Exercise - 04/02/19 0001      Lumbar Exercises: Stretches   Hip Flexor Stretch  30 seconds;Left;Right;2 reps    Quadruped Mid Back Stretch  2 reps;30 seconds   seated with red physioball     Lumbar Exercises: Aerobic   Tread Mill  L1.7 x 5 min (testing for symptoms), Retested L1.5 x 2:30 seconds      Lumbar Exercises: Machines for Strengthening   Leg Press  bil LE 1 x 12 20#, 1 x 12 30#      Lumbar Exercises: Supine   Clam  20 reps   x 1 set   Other Supine Lumbar Exercises  pressing red ball down onto legs keeping core tight throughout 2 x 10 holding 5 seconds ea.               PT Short Term Goals - 02/28/19  [place] #1   Title  pt to be I with inital HEP    Time  3    Period  Weeks    Status  Achieved      PT SHORT TERM GOAL #2   Title  pt to verbalize/ demo proper posture and lifting mechanics to reduce and prevent low back pain    Period  Weeks    Status  Achieved        PT Long Term Goals - 03/19/19 1512      PT LONG TERM GOAL #1   Title  pt to increase bil trunk sidebending by >/= 8 degrees and maintain all trunk ROM with </= 2/10 pain for functional mobiltiy    Period  Weeks    Status  On-going      PT LONG TERM GOAL #2   Title  increase L hip gross strength to >/=4+/5 to promote hip stability and reduce pain to </= 2/10    Period  Weeks    Status  On-going      PT LONG TERM GOAL #3   Title  pt to be able to walk / stand for >/= 60 min and perform sit to stand transitions with no limtations for functional endurance / mobility    Period  Weeks    Status  On-going      PT LONG TERM GOAL #4   Title  pt to be Ind with all HEP given as of last visit to maintain and progress current level of function ind    Status  On-going            Plan - 04/02/19 1545    Clinical  Impression Statement  pt continues to report limited changes in pain today. Focused on hip flexor stretching and extensor activation, core strengthening supine to avoid hip flexor activation. tested on treadmill initally then performed treatment and re-tested. pt's pain that limited endurnace closey relates to fatigue starting in the glute med on the L.    PT Treatment/Interventions  ADLs/Self Care Home Management;Cryotherapy;Electrical Stimulation;Iontophoresis 4mg /ml Dexamethasone;Moist Heat;Traction;Ultrasound;Therapeutic activities;Therapeutic exercise;Patient/family education;Manual techniques;Passive range of motion;Dry needling;Taping;Joint Manipulations    PT Next Visit Plan  ERO, update HEP PRN, STW along L lumbar paraspinals, potential inflare on L SIJ, hip abductor STW, adductor activation, posture education, modalities PRN,    Consulted and Agree with Plan of Care  Patient       Patient will benefit from skilled therapeutic intervention in order to improve the following deficits and impairments:  Improper body mechanics, Postural dysfunction, Increased muscle spasms, Decreased strength, Pain, Impaired UE functional use, Impaired vision/preception, Decreased endurance, Decreased balance, Decreased activity tolerance  Visit Diagnosis: Chronic bilateral low back pain, unspecified whether sciatica present  Muscle spasm of back  Abnormal posture     Problem List Patient Active Problem List   Diagnosis Date Noted  . Low back pain 01/25/2012  . Abdominal pain 11/02/2011  . Obesity (BMI 30.0-34.9) 10/31/2011  . Post herpetic neuralgia 09/14/2011  . DEPRESSION 12/22/2006  . HYPERTENSION 12/22/2006  . ALLERGIC RHINITIS 12/22/2006  . PALPITATIONS, HX OF 12/22/2006   Starr Lake PT, DPT, LAT, ATC  04/02/19  3:48 PM      [facility] [address] [place], [place], [address] Phone: [phone]   Fax:  [phone]  Name:  Carol Wilkins MRN: [id number] Date of Birth: [birth year]

## 2019-04-04 ENCOUNTER — Encounter: Payer: Self-pay | Admitting: Physical Therapy

## 2019-04-04 ENCOUNTER — Other Ambulatory Visit: Payer: Self-pay

## 2019-04-04 ENCOUNTER — Ambulatory Visit: Payer: Medicaid Other | Admitting: Physical Therapy

## 2019-04-04 DIAGNOSIS — M62830 Muscle spasm of back: Secondary | ICD-10-CM

## 2019-04-04 DIAGNOSIS — M545 Low back pain, unspecified: Secondary | ICD-10-CM

## 2019-04-04 DIAGNOSIS — R293 Abnormal posture: Secondary | ICD-10-CM

## 2019-04-04 DIAGNOSIS — G8929 Other chronic pain: Secondary | ICD-10-CM

## 2019-04-04 NOTE — Therapy (Signed)
[place], [place], [address] Phone: [phone]   Fax:  [phone]  Physical Therapy Treatment / Discharge   Patient Details  Name: Carol Wilkins MRN: [id number] Date of Birth: [birth year] Referring Provider (PT): Phylliss Bob   Encounter Date: 04/04/2019  PT End of Session - 04/04/19 1414    Visit Number  12    Number of Visits  13    Date for PT Re-Evaluation  04/04/19    Authorization Type  Medicaid    Authorization Time Period  03/11/19-04/14/19    Authorization - Visit Number  8    Authorization - Number of Visits  10    PT Start Time  3662    PT Stop Time  1442    PT Time Calculation (min)  28 min    Activity Tolerance  Patient tolerated treatment well    Behavior During Therapy  [facility] for tasks assessed/performed       Past Medical History:  Diagnosis Date  . Anxiety attack   . Diabetes mellitus without complication ([place])    type 2 on metformin  . High cholesterol   . Hypertension     Past Surgical History:  Procedure Laterality Date  . ABDOMINAL HYSTERECTOMY     in early 90's  . CHOLECYSTECTOMY     in the 90's  . TEE WITHOUT CARDIOVERSION N/A 07/14/2015   Procedure: TRANSESOPHAGEAL ECHOCARDIOGRAM (TEE);  Surgeon: Adrian Prows, MD;  Location: [place];  Service: Cardiovascular;  Laterality: N/A;  . TUBAL LIGATION    . TUBAL LIGATION  1980    There were no vitals filed for this visit.  Subjective Assessment - 04/04/19 1415    Subjective  "I am still feeling about the same    Currently in Pain?  Yes    Pain Score  0-No pain    Pain Location  Back    Pain Orientation  Left    Pain Type  Chronic pain    Pain Onset  More than a month ago    Pain Frequency  Intermittent    Aggravating Factors   walking/ standing         [facility] PT Assessment - 04/04/19 0001      AROM   Lumbar Flexion  48    Lumbar Extension  15    Lumbar - Right Side Bend  10    Lumbar - Left Side  Bend  10      Strength   Right Hip Flexion  4/5    Right Hip ABduction  4-/5    Right Hip ADduction  4/5    Left Hip Flexion  4/5    Left Hip ABduction  4-/5    Left Hip ADduction  4/5                           PT Education - 04/04/19 1447    Education Details  reviewed all previously provided HEP, benefits of consistency to maximize strenghtening. how to progress reps/ sets appropriate to challenge strength and promote endurance.    Person(s) Educated  Patient    Methods  Explanation;Handout;Verbal cues    Comprehension  Verbalized understanding;Verbal cues required       PT Short Term Goals - 02/28/19 1611      PT SHORT TERM GOAL #1   Title  pt to be I with inital HEP    Time  3  Period  Weeks    Status  Achieved      PT SHORT TERM GOAL #2   Title  pt to verbalize/ demo proper posture and lifting mechanics to reduce and prevent low back pain    Period  Weeks    Status  Achieved        PT Long Term Goals - 04/04/19 1418      PT LONG TERM GOAL #1   Title  pt to increase bil trunk sidebending by >/= 8 degrees and maintain all trunk ROM with </= 2/10 pain for functional mobiltiy    Time  6    Period  Weeks    Status  Not Met      PT LONG TERM GOAL #2   Title  increase L hip gross strength to >/=4+/5 to promote hip stability and reduce pain to </= 2/10    Period  Weeks    Status  Not Met      PT LONG TERM GOAL #3   Title  pt to be able to walk / stand for >/= 60 min and perform sit to stand transitions with no limtations for functional endurance / mobility    Baseline  no pain with sit to stand 30 max walking    Time  6    Period  Weeks    Status  Partially Met      PT LONG TERM GOAL #4   Title  pt to be Ind with all HEP given as of last visit to maintain and progress current level of function ind    Baseline  independent with current HEP and progressing as able.    Period  Weeks    Status  Achieved            Plan - 04/04/19  1442    Clinical Impression Statement  [name] has made progress with physical therapy reporting decreased pain noted as intermittent. She does continue to demonstrate limited trunk mobility in all planes and weakness in bil hips. MMT of bil hip abductors produces concordant symptoms. She met all STG's and only LTG # 4 and partially met LTG #3 but did not meet 1 or 2. She continues to have pain with prolonged standing/ walking noting limited endurance which seems to strength related. Focused session on education and reviewing HEP, based on limited functional progress and continued pain pt would benefit from returning to her referring provider for further assessment, and will be discharged from PT today.    PT Treatment/Interventions  ADLs/Self Care Home Management;Cryotherapy;Electrical Stimulation;Iontophoresis 33m/ml Dexamethasone;Moist Heat;Traction;Ultrasound;Therapeutic activities;Therapeutic exercise;Patient/family education;Manual techniques;Passive range of motion;Dry needling;Taping;Joint Manipulations    PT Next Visit Plan  D/C    PT Home Exercise Plan  lower trunk rotation, isometric hip adduction, hip abduction stretching, hamstring stretching (supine/ sitting), bridge, supine clam red band, posture handout, hip ER on L, piriformis stretching on the R    Consulted and Agree with Plan of Care  Patient       Patient will benefit from skilled therapeutic intervention in order to improve the following deficits and impairments:  Improper body mechanics, Postural dysfunction, Increased muscle spasms, Decreased strength, Pain, Impaired UE functional use, Impaired vision/preception, Decreased endurance, Decreased balance, Decreased activity tolerance  Visit Diagnosis: Chronic bilateral low back pain, unspecified whether sciatica present  Muscle spasm of back  Abnormal posture     Problem List Patient Active Problem List   Diagnosis Date Noted  . Low back pain 01/25/2012  .  Abdominal  pain 11/02/2011  . Obesity (BMI 30.0-34.9) 10/31/2011  . Post herpetic neuralgia 09/14/2011  . DEPRESSION 12/22/2006  . HYPERTENSION 12/22/2006  . ALLERGIC RHINITIS 12/22/2006  . PALPITATIONS, HX OF 12/22/2006    Starr Lake 04/04/2019, 2:49 PM  [facility] [address] [place], [place], [address] Phone: [phone]   Fax:  [phone]  Name: Carol Wilkins MRN: [id number] Date of Birth: 1956/03/08      PHYSICAL THERAPY DISCHARGE SUMMARY  Visits from Start of Care: 12  Current functional level related to goals / functional outcomes: See goals   Remaining deficits: Limited walking/ standing endurance, secondary to pain and stiffness in the back and L hip.    Education / Equipment: HEP, theraband, posture, lifting mechanics, anatomy of the area's involved.   Plan: Patient agrees to discharge.  Patient goals were partially met. Patient is being discharged due to lack of progress.  ?????         Kristelle Cavallaro PT, DPT, LAT, ATC  04/04/19  2:50 PM

## 2019-04-24 ENCOUNTER — Other Ambulatory Visit: Payer: Self-pay | Admitting: Orthopedic Surgery

## 2019-04-24 DIAGNOSIS — M545 Low back pain, unspecified: Secondary | ICD-10-CM

## 2019-05-02 ENCOUNTER — Other Ambulatory Visit: Payer: Medicaid Other

## 2019-05-05 ENCOUNTER — Other Ambulatory Visit: Payer: Self-pay

## 2019-05-05 ENCOUNTER — Ambulatory Visit
Admission: RE | Admit: 2019-05-05 | Discharge: 2019-05-05 | Disposition: A | Discharge status: Home / Self Care | Payer: Medicaid Other | Source: Ambulatory Visit | Attending: Orthopedic Surgery | Admitting: Orthopedic Surgery

## 2019-05-05 DIAGNOSIS — M545 Low back pain, unspecified: Secondary | ICD-10-CM

## 2019-12-10 ENCOUNTER — Other Ambulatory Visit: Payer: Self-pay | Admitting: Family Medicine

## 2019-12-10 DIAGNOSIS — Z1231 Encounter for screening mammogram for malignant neoplasm of breast: Secondary | ICD-10-CM

## 2020-01-10 ENCOUNTER — Ambulatory Visit
Admission: RE | Admit: 2020-01-10 | Discharge: 2020-01-10 | Disposition: A | Discharge status: Home / Self Care | Payer: Medicaid Other | Source: Ambulatory Visit | Attending: Family Medicine | Admitting: Family Medicine

## 2020-01-10 ENCOUNTER — Other Ambulatory Visit: Payer: Self-pay

## 2020-01-10 DIAGNOSIS — Z1231 Encounter for screening mammogram for malignant neoplasm of breast: Secondary | ICD-10-CM

## 2020-02-01 ENCOUNTER — Encounter (HOSPITAL_COMMUNITY): Payer: Self-pay | Admitting: Emergency Medicine

## 2020-02-01 ENCOUNTER — Other Ambulatory Visit: Payer: Self-pay

## 2020-02-01 ENCOUNTER — Emergency Department (HOSPITAL_COMMUNITY): Payer: Medicare HMO

## 2020-02-01 ENCOUNTER — Emergency Department (HOSPITAL_COMMUNITY)
Admission: EM | Admit: 2020-02-01 | Discharge: 2020-02-01 | Disposition: A | Discharge status: Home / Self Care | Payer: Medicare HMO | Attending: Emergency Medicine | Admitting: Emergency Medicine

## 2020-02-01 DIAGNOSIS — Z87891 Personal history of nicotine dependence: Secondary | ICD-10-CM | POA: Insufficient documentation

## 2020-02-01 DIAGNOSIS — Z79899 Other long term (current) drug therapy: Secondary | ICD-10-CM | POA: Insufficient documentation

## 2020-02-01 DIAGNOSIS — R0602 Shortness of breath: Secondary | ICD-10-CM | POA: Diagnosis not present

## 2020-02-01 DIAGNOSIS — I10 Essential (primary) hypertension: Secondary | ICD-10-CM | POA: Diagnosis not present

## 2020-02-01 DIAGNOSIS — Z7984 Long term (current) use of oral hypoglycemic drugs: Secondary | ICD-10-CM | POA: Insufficient documentation

## 2020-02-01 DIAGNOSIS — Z7982 Long term (current) use of aspirin: Secondary | ICD-10-CM | POA: Diagnosis not present

## 2020-02-01 DIAGNOSIS — R079 Chest pain, unspecified: Secondary | ICD-10-CM | POA: Diagnosis not present

## 2020-02-01 DIAGNOSIS — R42 Dizziness and giddiness: Secondary | ICD-10-CM | POA: Diagnosis not present

## 2020-02-01 DIAGNOSIS — E119 Type 2 diabetes mellitus without complications: Secondary | ICD-10-CM | POA: Diagnosis not present

## 2020-02-01 DIAGNOSIS — R202 Paresthesia of skin: Secondary | ICD-10-CM | POA: Diagnosis not present

## 2020-02-01 DIAGNOSIS — R0789 Other chest pain: Secondary | ICD-10-CM | POA: Diagnosis not present

## 2020-02-01 LAB — BASIC METABOLIC PANEL
Anion gap: 11 (ref 5–15)
BUN: 11 mg/dL (ref 8–23)
CO2: 27 mmol/L (ref 22–32)
Calcium: 10.1 mg/dL (ref 8.9–10.3)
Chloride: 103 mmol/L (ref 98–111)
Creatinine, Ser: 1.05 mg/dL — ABNORMAL HIGH (ref 0.44–1.00)
GFR, Estimated: 56 mL/min — ABNORMAL LOW (ref >60)
Glucose, Bld: 131 mg/dL — ABNORMAL HIGH (ref 70–99)
Potassium: 3.4 mmol/L — ABNORMAL LOW (ref 3.5–5.1)
Sodium: 141 mmol/L (ref 135–145)

## 2020-02-01 LAB — CBC
HCT: 42.5 % (ref 36.0–46.0)
Hemoglobin: 12.9 g/dL (ref 12.0–15.0)
MCH: 25.9 pg — ABNORMAL LOW (ref 26.0–34.0)
MCHC: 30.4 g/dL (ref 30.0–36.0)
MCV: 85.3 fL (ref 80.0–100.0)
Platelets: 243 10*3/uL (ref 150–400)
RBC: 4.98 MIL/uL (ref 3.87–5.11)
RDW: 15.2 % (ref 11.5–15.5)
WBC: 4 10*3/uL (ref 4.0–10.5)
nRBC: 0 % (ref 0.0–0.2)

## 2020-02-01 LAB — TROPONIN I (HIGH SENSITIVITY)
Troponin I (High Sensitivity): 6 ng/L (ref <18)
Troponin I (High Sensitivity): 4 ng/L (ref <18)

## 2020-02-01 MED ORDER — ACETAMINOPHEN 500 MG PO TABS: 1000.0000 mg | ORAL_TABLET | Freq: Four times a day (QID) | ORAL | 0 refills | Status: AC | PRN

## 2020-02-01 MED ORDER — METHOCARBAMOL 750 MG PO TABS: 750.0000 mg | ORAL_TABLET | Freq: Three times a day (TID) | ORAL | 0 refills | Status: DC | PRN

## 2020-02-01 MED ORDER — ACETAMINOPHEN 500 MG PO TABS: 1000.0000 mg | ORAL_TABLET | Freq: Four times a day (QID) | ORAL | 0 refills | Status: DC | PRN

## 2020-02-01 NOTE — Discharge Instructions (Signed)
1.  Schedule a follow-up appoint with your family doctor.  Discussed referral to cardiology for a follow-up visit and an evaluation for repeat stress test. 2.  At this time you are given a muscle relaxer to take for symptoms of tightness and spasm in your neck with tingling in the arm.  Also, you may take extra strength Tylenol every 6 hours.  If this is improving your symptoms, it is more likely that this is due to musculoskeletal pain in your neck.  Start this medication for the next several days and let your doctor know if this is improving your symptoms. 3.  Return to emergency department immediately for recheck if you are developing worsening, changing or concerning symptoms.

## 2020-02-01 NOTE — ED Triage Notes (Signed)
Pt to triage via [facility] from home.  Reports L sided chest pain and aching to R arm since 8:30am.  Reports headache, dizziness, and SOB with exertion x 2 days.  Denies nausea and vomiting.  Denies fever.    20g LAC and 324mg  ASA PTA.

## 2020-02-01 NOTE — ED Provider Notes (Signed)
[facility] EMERGENCY DEPARTMENT Provider Note   CSN: [id number] Arrival date & time: 02/01/20  1128     History Chief Complaint  Patient presents with   Chest Pain    Carol Wilkins is a 64 y.o. female.  HPI Patient reports for several days she has been noticing episodes of discomfort from a couple of locations in her chest.  She reports yesterday she was having a discomfort in her right chest and a tingling sensation in the right arm.  The sensation of the right arm came in spontaneously resolved.  It started this morning about 8:30 AM.  She is also noted some discomfort on the left side of the chest that was aching in quality up toward the upper chest.  At another time she had some discomfort on both sides of her neck.  The symptoms have waxed and waned for about 2 days.  She reports she was taking a shower and felt lightheaded today and that precipitated her coming to the emergency department.  She has not been having fevers, chills, cough, nausea or vomiting.  No swelling in the legs or pain in the calves.  She reports her diabetes is pretty well controlled with her A1c staying at around 7.  Patient reports she did have a stress test about 3 years ago with no abnormalities identified.    Past Medical History:  Diagnosis Date   Anxiety attack    Diabetes mellitus without complication ([place])    type 2 on metformin   High cholesterol    Hypertension     Patient Active Problem List   Diagnosis Date Noted   Low back pain 01/25/2012   Abdominal pain 11/02/2011   Obesity (BMI 30.0-34.9) 10/31/2011   Post herpetic neuralgia 09/14/2011   DEPRESSION 12/22/2006   HYPERTENSION 12/22/2006   ALLERGIC RHINITIS 12/22/2006   PALPITATIONS, HX OF 12/22/2006    Past Surgical History:  Procedure Laterality Date   ABDOMINAL HYSTERECTOMY     in early 65's   CHOLECYSTECTOMY     in the 90's   TEE WITHOUT CARDIOVERSION N/A 07/14/2015   Procedure:  TRANSESOPHAGEAL ECHOCARDIOGRAM (TEE);  Surgeon: Adrian Prows, MD;  Location: [place];  Service: Cardiovascular;  Laterality: N/A;   [place]     OB History   No obstetric history on file.     Family History  Problem Relation Age of Onset   Heart disease Mother    Diabetes Mother    Hypertension Mother    Diabetes Father    Hypertension Father    Diabetes Sister    Hypertension Sister    Heart disease Sister    Diabetes Brother    Hypertension Brother    Heart disease Brother     Social History   Tobacco Use   Smoking status: Former Smoker    Quit date: 09/13/1980    Years since quitting: 39.4   Smokeless tobacco: Never Used  Substance Use Topics   Alcohol use: No   Drug use: No    Home Medications Prior to Admission medications   Medication Sig Start Date End Date Taking? Authorizing Provider  acetaminophen (TYLENOL) 500 MG tablet Take 2 tablets (1,000 mg total) by mouth every 6 (six) hours as needed. 02/01/20   Charlesetta Shanks, MD  amLODipine (NORVASC) 10 MG tablet Take 10 mg by mouth daily. 01/25/17   [provider]  amLODipine (NORVASC) 5 MG tablet Take 5 mg  by mouth daily.    [provider]  aspirin EC 81 MG tablet Take 81 mg by mouth daily.    [provider]  atorvastatin (LIPITOR) 10 MG tablet Take 10 mg by mouth daily.    [provider]  Azilsartan Medoxomil (EDARBI) 40 MG TABS Take 40 mg by mouth daily.    [provider]  diclofenac (VOLTAREN) 75 MG EC tablet Take 1 tablet (75 mg total) by mouth 2 (two) times daily. Patient not taking: Reported on 01/30/2019 03/21/18   Robyn Haber, MD  hydrochlorothiazide (HYDRODIURIL) 25 MG tablet Take 25 mg by mouth daily.  02/23/17   [provider]  hydrochlorothiazide (MICROZIDE) 12.5 MG capsule Take 12.5 mg by mouth daily. 05/25/14   [provider]  metFORMIN (GLUCOPHAGE) 500 MG tablet Take by mouth  daily with breakfast.    [provider]  methocarbamol (ROBAXIN-750) 750 MG tablet Take 1 tablet (750 mg total) by mouth every 8 (eight) hours as needed for muscle spasms. 02/01/20   Charlesetta Shanks, MD    Allergies    Penicillins  Review of Systems   Review of Systems 10 systems reviewed and negative except as per HPI Physical Exam Updated Vital Signs BP 134/62    Pulse 69    Temp 98.2 F (36.8 C) (Oral)    Resp 16    Ht 5\' 3"  (1.6 m)    Wt 81.2 kg    SpO2 99%    BMI 31.71 kg/m   Physical Exam Constitutional:      Appearance: Normal appearance. She is well-developed.  HENT:     Head: Normocephalic and atraumatic.  Eyes:     Extraocular Movements: Extraocular movements intact.  Cardiovascular:     Rate and Rhythm: Normal rate and regular rhythm.     Heart sounds: Normal heart sounds.  Pulmonary:     Effort: Pulmonary effort is normal.     Breath sounds: Normal breath sounds.  Chest:     Chest wall: No tenderness.  Abdominal:     General: Bowel sounds are normal. There is no distension.     Palpations: Abdomen is soft.     Tenderness: There is no abdominal tenderness.  Musculoskeletal:        General: No swelling or tenderness. Normal range of motion.     Cervical back: Neck supple. No rigidity or tenderness.     Right lower leg: No edema.     Left lower leg: No edema.  Lymphadenopathy:     Cervical: No cervical adenopathy.  Skin:    General: Skin is warm and dry.  Neurological:     General: No focal deficit present.     Mental Status: She is alert and oriented to person, place, and time.     GCS: GCS eye subscore is 4. GCS verbal subscore is 5. GCS motor subscore is 6.     Motor: No weakness.     Coordination: Coordination normal.  Psychiatric:        Mood and Affect: Mood normal.     ED Results / Procedures / Treatments   Labs (all labs ordered are listed, but only abnormal results are displayed) Labs Reviewed  BASIC METABOLIC PANEL - Abnormal;  Notable for the following components:      Result Value   Potassium 3.4 (*)    Glucose, Bld 131 (*)    Creatinine, Ser 1.05 (*)    GFR, Estimated 56 (*)    All other  components within normal limits  CBC - Abnormal; Notable for the following components:   MCH 25.9 (*)    All other components within normal limits  TROPONIN I (HIGH SENSITIVITY)  TROPONIN I (HIGH SENSITIVITY)    EKG EKG Interpretation  Date/Time:  Saturday February 01 2020 11:38:08 EDT Ventricular Rate:  70 PR Interval:  136 QRS Duration: 64 QT Interval:  400 QTC Calculation: 432 R Axis:   35 Text Interpretation: Normal sinus rhythm Low voltage QRS Abnormal ECG no sig change from previous Confirmed by Charlesetta Shanks [id number]) on 02/01/2020 5:38:50 PM   Radiology DG Chest 2 View  Result Date: 02/01/2020 CLINICAL DATA:  Chest pain and shortness of breath EXAM: CHEST - 2 VIEW COMPARISON:  None. FINDINGS: The lungs are clear. The heart size and pulmonary vascularity are normal. No adenopathy. There is degenerative change in the thoracic spine. There are surgical clips in the right upper abdomen. No pneumothorax. IMPRESSION: Lungs clear.  Cardiac silhouette within normal limits. Electronically Signed   By: Lowella Grip III M.D.   On: 02/01/2020 12:04    Procedures Procedures (including critical care time)  Medications Ordered in ED Medications - No data to display  ED Course  I have reviewed the triage vital signs and the nursing notes.  Pertinent labs & imaging results that were available during my care of the patient were reviewed by me and considered in my medical decision making (see chart for details).    MDM Rules/Calculators/A&P                          Patient presents as outlined with several days of symptoms including paresthesia in the right arm, discomfort or stiffness in the neck and left chest pain.  The symptoms are waxing and waning.  Currently, patient has normal blood pressure, sinus  rhythm rate controlled heart rhythm.  EKG does not show any ischemic changes and unchanged from previous.  2 sets of troponins normal.  This time, no evidence of MI.  Patient does have some risk factors for coronary artery disease.  She had a stress test within about the past 3 years.  She is counseled to follow-up with her PCP and cardiology for further evaluation to rule out coronary artery disease.  Return precautions are reviewed. Symptoms have some quality suggestive of musculoskeletal cervical pain or disc compression.  Patient has paresthesia but no weakness or dysfunction.  Will give a trial of Robaxin and acetaminophen to see if this alleviates symptoms.  Return precautions reviewed regarding any development of weakness, worsening pain or other concerning symptoms. Final Clinical Impression(s) / ED Diagnoses Final diagnoses:  Nonspecific chest pain  Paresthesia    Rx / DC Orders ED Discharge Orders         Ordered    methocarbamol (ROBAXIN-750) 750 MG tablet  Every 8 hours PRN        02/01/20 1741    acetaminophen (TYLENOL) 500 MG tablet  Every 6 hours PRN        02/01/20 1741           Charlesetta Shanks, MD 02/01/20 1746

## 2020-02-03 DIAGNOSIS — M7912 Myalgia of auxiliary muscles, head and neck: Secondary | ICD-10-CM | POA: Diagnosis not present

## 2020-02-03 DIAGNOSIS — K5900 Constipation, unspecified: Secondary | ICD-10-CM | POA: Diagnosis not present

## 2020-04-16 DIAGNOSIS — Z20822 Contact with and (suspected) exposure to covid-19: Secondary | ICD-10-CM | POA: Diagnosis not present

## 2020-04-18 DIAGNOSIS — E119 Type 2 diabetes mellitus without complications: Secondary | ICD-10-CM | POA: Diagnosis not present

## 2020-05-12 DIAGNOSIS — E785 Hyperlipidemia, unspecified: Secondary | ICD-10-CM | POA: Diagnosis not present

## 2020-05-12 DIAGNOSIS — I10 Essential (primary) hypertension: Secondary | ICD-10-CM | POA: Diagnosis not present

## 2020-05-12 DIAGNOSIS — E1165 Type 2 diabetes mellitus with hyperglycemia: Secondary | ICD-10-CM | POA: Diagnosis not present

## 2020-05-19 DIAGNOSIS — E119 Type 2 diabetes mellitus without complications: Secondary | ICD-10-CM | POA: Diagnosis not present

## 2020-05-20 DIAGNOSIS — Z20822 Contact with and (suspected) exposure to covid-19: Secondary | ICD-10-CM | POA: Diagnosis not present

## 2020-06-13 DIAGNOSIS — E1169 Type 2 diabetes mellitus with other specified complication: Secondary | ICD-10-CM | POA: Diagnosis not present

## 2020-06-13 DIAGNOSIS — M545 Low back pain, unspecified: Secondary | ICD-10-CM | POA: Diagnosis not present

## 2020-06-15 DIAGNOSIS — E1165 Type 2 diabetes mellitus with hyperglycemia: Secondary | ICD-10-CM | POA: Diagnosis not present

## 2020-07-07 DIAGNOSIS — I10 Essential (primary) hypertension: Secondary | ICD-10-CM | POA: Diagnosis not present

## 2020-07-07 DIAGNOSIS — M5432 Sciatica, left side: Secondary | ICD-10-CM | POA: Diagnosis not present

## 2020-07-07 DIAGNOSIS — E1165 Type 2 diabetes mellitus with hyperglycemia: Secondary | ICD-10-CM | POA: Diagnosis not present

## 2020-07-07 DIAGNOSIS — R195 Other fecal abnormalities: Secondary | ICD-10-CM | POA: Diagnosis not present

## 2020-07-09 DIAGNOSIS — Z86010 Personal history of colonic polyps: Secondary | ICD-10-CM | POA: Diagnosis not present

## 2020-07-09 DIAGNOSIS — K5900 Constipation, unspecified: Secondary | ICD-10-CM | POA: Diagnosis not present

## 2020-07-09 DIAGNOSIS — K625 Hemorrhage of anus and rectum: Secondary | ICD-10-CM | POA: Diagnosis not present

## 2020-07-09 DIAGNOSIS — Z1211 Encounter for screening for malignant neoplasm of colon: Secondary | ICD-10-CM | POA: Diagnosis not present

## 2020-07-11 DIAGNOSIS — Z7189 Other specified counseling: Secondary | ICD-10-CM | POA: Diagnosis not present

## 2020-07-11 DIAGNOSIS — I10 Essential (primary) hypertension: Secondary | ICD-10-CM | POA: Diagnosis not present

## 2020-07-11 DIAGNOSIS — E1169 Type 2 diabetes mellitus with other specified complication: Secondary | ICD-10-CM | POA: Diagnosis not present

## 2020-07-11 DIAGNOSIS — K219 Gastro-esophageal reflux disease without esophagitis: Secondary | ICD-10-CM | POA: Diagnosis not present

## 2020-07-11 DIAGNOSIS — E119 Type 2 diabetes mellitus without complications: Secondary | ICD-10-CM | POA: Diagnosis not present

## 2020-07-13 DIAGNOSIS — E1165 Type 2 diabetes mellitus with hyperglycemia: Secondary | ICD-10-CM | POA: Diagnosis not present

## 2020-07-14 DIAGNOSIS — E119 Type 2 diabetes mellitus without complications: Secondary | ICD-10-CM | POA: Diagnosis not present

## 2020-07-21 DIAGNOSIS — Z1211 Encounter for screening for malignant neoplasm of colon: Secondary | ICD-10-CM | POA: Diagnosis not present

## 2020-07-21 DIAGNOSIS — Z1212 Encounter for screening for malignant neoplasm of rectum: Secondary | ICD-10-CM | POA: Diagnosis not present

## 2020-07-23 DIAGNOSIS — I10 Essential (primary) hypertension: Secondary | ICD-10-CM | POA: Diagnosis not present

## 2020-07-23 DIAGNOSIS — K219 Gastro-esophageal reflux disease without esophagitis: Secondary | ICD-10-CM | POA: Diagnosis not present

## 2020-07-23 DIAGNOSIS — E785 Hyperlipidemia, unspecified: Secondary | ICD-10-CM | POA: Diagnosis not present

## 2020-07-23 DIAGNOSIS — M545 Low back pain, unspecified: Secondary | ICD-10-CM | POA: Diagnosis not present

## 2020-07-23 DIAGNOSIS — E1169 Type 2 diabetes mellitus with other specified complication: Secondary | ICD-10-CM | POA: Diagnosis not present

## 2020-07-23 DIAGNOSIS — Z7189 Other specified counseling: Secondary | ICD-10-CM | POA: Diagnosis not present

## 2020-07-23 DIAGNOSIS — M5432 Sciatica, left side: Secondary | ICD-10-CM | POA: Diagnosis not present

## 2020-08-13 DIAGNOSIS — E1165 Type 2 diabetes mellitus with hyperglycemia: Secondary | ICD-10-CM | POA: Diagnosis not present

## 2020-08-24 DIAGNOSIS — Z1211 Encounter for screening for malignant neoplasm of colon: Secondary | ICD-10-CM | POA: Diagnosis not present

## 2020-08-24 DIAGNOSIS — K635 Polyp of colon: Secondary | ICD-10-CM | POA: Diagnosis not present

## 2020-08-24 DIAGNOSIS — K625 Hemorrhage of anus and rectum: Secondary | ICD-10-CM | POA: Diagnosis not present

## 2020-08-24 DIAGNOSIS — D122 Benign neoplasm of ascending colon: Secondary | ICD-10-CM | POA: Diagnosis not present

## 2020-08-24 DIAGNOSIS — Z86010 Personal history of colonic polyps: Secondary | ICD-10-CM | POA: Diagnosis not present

## 2020-09-03 DIAGNOSIS — I10 Essential (primary) hypertension: Secondary | ICD-10-CM | POA: Diagnosis not present

## 2020-09-03 DIAGNOSIS — M25571 Pain in right ankle and joints of right foot: Secondary | ICD-10-CM | POA: Diagnosis not present

## 2020-09-03 DIAGNOSIS — K5710 Diverticulosis of small intestine without perforation or abscess without bleeding: Secondary | ICD-10-CM | POA: Diagnosis not present

## 2020-09-03 DIAGNOSIS — M5432 Sciatica, left side: Secondary | ICD-10-CM | POA: Diagnosis not present

## 2020-09-03 DIAGNOSIS — R2242 Localized swelling, mass and lump, left lower limb: Secondary | ICD-10-CM | POA: Diagnosis not present

## 2020-09-09 ENCOUNTER — Ambulatory Visit (INDEPENDENT_AMBULATORY_CARE_PROVIDER_SITE_OTHER): Payer: Medicare Other | Admitting: Podiatry

## 2020-09-09 ENCOUNTER — Other Ambulatory Visit: Payer: Self-pay

## 2020-09-09 ENCOUNTER — Encounter: Payer: Self-pay | Admitting: Podiatry

## 2020-09-09 DIAGNOSIS — K625 Hemorrhage of anus and rectum: Secondary | ICD-10-CM | POA: Insufficient documentation

## 2020-09-09 DIAGNOSIS — Z860100 Personal history of colon polyps, unspecified: Secondary | ICD-10-CM | POA: Insufficient documentation

## 2020-09-09 DIAGNOSIS — M2141 Flat foot [pes planus] (acquired), right foot: Secondary | ICD-10-CM

## 2020-09-09 DIAGNOSIS — M76821 Posterior tibial tendinitis, right leg: Secondary | ICD-10-CM | POA: Diagnosis not present

## 2020-09-09 DIAGNOSIS — M779 Enthesopathy, unspecified: Secondary | ICD-10-CM | POA: Diagnosis not present

## 2020-09-09 DIAGNOSIS — M2140 Flat foot [pes planus] (acquired), unspecified foot: Secondary | ICD-10-CM | POA: Insufficient documentation

## 2020-09-09 DIAGNOSIS — M2142 Flat foot [pes planus] (acquired), left foot: Secondary | ICD-10-CM

## 2020-09-09 DIAGNOSIS — M2010 Hallux valgus (acquired), unspecified foot: Secondary | ICD-10-CM

## 2020-09-09 DIAGNOSIS — K589 Irritable bowel syndrome without diarrhea: Secondary | ICD-10-CM | POA: Insufficient documentation

## 2020-09-09 DIAGNOSIS — R142 Eructation: Secondary | ICD-10-CM | POA: Insufficient documentation

## 2020-09-09 DIAGNOSIS — M76822 Posterior tibial tendinitis, left leg: Secondary | ICD-10-CM | POA: Diagnosis not present

## 2020-09-09 DIAGNOSIS — R141 Gas pain: Secondary | ICD-10-CM | POA: Insufficient documentation

## 2020-09-09 DIAGNOSIS — K5900 Constipation, unspecified: Secondary | ICD-10-CM | POA: Insufficient documentation

## 2020-09-09 DIAGNOSIS — Z1211 Encounter for screening for malignant neoplasm of colon: Secondary | ICD-10-CM | POA: Insufficient documentation

## 2020-09-09 NOTE — Progress Notes (Signed)
This patient returns to my office for at risk foot care.  This patient requires this care by a professional since this patient will be at risk due to having diabetes.  This patient presents the office stating that she is having occasional pain in her right forefoot when she walks.  Patient states that this has been going on for months and even longer.  She says she was previously diagnosed with flatfeet but denies any treatment provided.  Patient is diabetic and has previously been treated with gabapentin..  This patient presents for at risk foot care today.  General Appearance  Alert, conversant and in no acute stress.  Vascular  Dorsalis pedis and posterior tibial  pulses are palpable  bilaterally.  Capillary return is within normal limits  bilaterally. Temperature is within normal limits  bilaterally.  Neurologic  Senn-Weinstein monofilament wire test within normal limits  bilaterally. Muscle power within normal limits bilaterally.  Nails Thick disfigured discolored nails with subungual debris  from hallux to fifth toes bilaterally. No evidence of bacterial infection or drainage bilaterally.  Orthopedic  No limitations of motion  feet .  No crepitus or effusions noted.  No bony pathology or digital deformities noted.  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  Forefoot hypermobility  B/L.  Swelling noted around the lateral malleolus left foot.  Pes planus with medial bulge at TNJ  B/L.   Palpable pain sub 2 right foot.  PTTD  B/L>  Skin  normotropic skin with no porokeratosis noted bilaterally.  No signs of infections or ulcers noted.     Capsulitis right foot.  Pes planus  B/L.  HAV  B/L.  Consent was obtained for treatment procedures.   Discussed this condition with this patient.  Told the patient she is experiencing a capsulitis in her right forefoot due to her bunion and hypermobility and pes planus foot type.  Recommended we try power step insoles in an effort to control her flatfootedness and help to  relieve her pain.  She is to return to the office in 4 weeks for continued evaluation and probable orthotic therapy.   Return office visit   4 weeks                   Told patient to return for periodic foot care and evaluation due to potential at risk complications.   Gardiner Barefoot DPM

## 2020-09-12 DIAGNOSIS — E1165 Type 2 diabetes mellitus with hyperglycemia: Secondary | ICD-10-CM | POA: Diagnosis not present

## 2020-10-07 ENCOUNTER — Encounter: Payer: Self-pay | Admitting: Podiatry

## 2020-10-07 ENCOUNTER — Other Ambulatory Visit: Payer: Self-pay

## 2020-10-07 ENCOUNTER — Ambulatory Visit (INDEPENDENT_AMBULATORY_CARE_PROVIDER_SITE_OTHER): Payer: Medicare Other | Admitting: Podiatry

## 2020-10-07 DIAGNOSIS — M779 Enthesopathy, unspecified: Secondary | ICD-10-CM | POA: Diagnosis not present

## 2020-10-07 DIAGNOSIS — M2142 Flat foot [pes planus] (acquired), left foot: Secondary | ICD-10-CM | POA: Diagnosis not present

## 2020-10-07 DIAGNOSIS — M2141 Flat foot [pes planus] (acquired), right foot: Secondary | ICD-10-CM

## 2020-10-07 NOTE — Progress Notes (Signed)
This patient returns to my office for at risk foot care.  This patient requires this care by a professional since this patient will be at risk due to having diabetes.  This patient states she has been better wearing the powerstep insoles  but still states pain occurs.  She says there is burning through her forefeet after walking long periods of time. Patient is diabetic and has previously been treated with gabapentin..  This patient presents for at risk foot care today.  General Appearance  Alert, conversant and in no acute stress.  Vascular  Dorsalis pedis and posterior tibial  pulses are palpable  bilaterally.  Capillary return is within normal limits  bilaterally. Temperature is within normal limits  bilaterally.  Neurologic  Senn-Weinstein monofilament wire test within normal limits  bilaterally. Muscle power within normal limits bilaterally.  Nails Thick disfigured discolored nails with subungual debris  from hallux to fifth toes bilaterally. No evidence of bacterial infection or drainage bilaterally.  Orthopedic  No limitations of motion  feet .  No crepitus or effusions noted.  No bony pathology or digital deformities noted.  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  Forefoot hypermobility  B/L.  Swelling noted around the lateral malleolus left foot.  Pes planus with medial bulge at TNJ  B/L.   Palpable pain sub 2 right foot.  PTTD  B/L.  Skin  normotropic skin with no porokeratosis noted bilaterally.  No signs of infections or ulcers noted.     Capsulitis right foot.  Pes planus  B/L.  HAV  B/L.  Consent was obtained for treatment procedures.   Discussed this condition with this patient.  Proceeded to apply padding to the  the powersteps in an effort to relieve her forefoot pain.  If this problem persists she will need to make an appointment with one of the medical podiatrists.   Return office visit  prn                  Told patient to return for periodic foot care and evaluation due to potential at  risk complication. GAM

## 2020-10-13 DIAGNOSIS — E1165 Type 2 diabetes mellitus with hyperglycemia: Secondary | ICD-10-CM | POA: Diagnosis not present

## 2020-11-12 DIAGNOSIS — E1165 Type 2 diabetes mellitus with hyperglycemia: Secondary | ICD-10-CM | POA: Diagnosis not present

## 2020-11-26 ENCOUNTER — Other Ambulatory Visit: Payer: Self-pay | Admitting: Family Medicine

## 2020-11-26 DIAGNOSIS — Z1231 Encounter for screening mammogram for malignant neoplasm of breast: Secondary | ICD-10-CM

## 2020-12-01 DIAGNOSIS — I700 Atherosclerosis of aorta: Secondary | ICD-10-CM | POA: Diagnosis not present

## 2020-12-01 DIAGNOSIS — E1169 Type 2 diabetes mellitus with other specified complication: Secondary | ICD-10-CM | POA: Diagnosis not present

## 2020-12-01 DIAGNOSIS — R143 Flatulence: Secondary | ICD-10-CM | POA: Diagnosis not present

## 2020-12-01 DIAGNOSIS — R140 Abdominal distension (gaseous): Secondary | ICD-10-CM | POA: Diagnosis not present

## 2020-12-01 DIAGNOSIS — M25552 Pain in left hip: Secondary | ICD-10-CM | POA: Diagnosis not present

## 2020-12-13 DIAGNOSIS — E1165 Type 2 diabetes mellitus with hyperglycemia: Secondary | ICD-10-CM | POA: Diagnosis not present

## 2021-01-13 ENCOUNTER — Ambulatory Visit (INDEPENDENT_AMBULATORY_CARE_PROVIDER_SITE_OTHER): Payer: Medicare Other | Admitting: Podiatry

## 2021-01-13 ENCOUNTER — Encounter: Payer: Self-pay | Admitting: Podiatry

## 2021-01-13 ENCOUNTER — Other Ambulatory Visit: Payer: Self-pay

## 2021-01-13 DIAGNOSIS — M76821 Posterior tibial tendinitis, right leg: Secondary | ICD-10-CM

## 2021-01-13 DIAGNOSIS — M76822 Posterior tibial tendinitis, left leg: Secondary | ICD-10-CM

## 2021-01-13 DIAGNOSIS — M779 Enthesopathy, unspecified: Secondary | ICD-10-CM

## 2021-01-13 DIAGNOSIS — M2141 Flat foot [pes planus] (acquired), right foot: Secondary | ICD-10-CM | POA: Diagnosis not present

## 2021-01-13 DIAGNOSIS — M2142 Flat foot [pes planus] (acquired), left foot: Secondary | ICD-10-CM | POA: Diagnosis not present

## 2021-01-13 NOTE — Progress Notes (Signed)
This patient returns to the office for continued evaluation and treatment of capsulitis right foot as well as pes planus bilaterally.  She was seen approximately 3 months ago and treated with insoles that were applied that she wears as well as padding that was applied to the insoles.  She returns to the office and states that she is markedly improved but still has pain noted and a callus on the bottom of her right forefoot.  She is very pleased with her improvement.  She returns to the office for continued evaluation and treatment of her diabetic feet.  Vascular  Dorsalis pedis and posterior tibial pulses are palpable  B/L.  Capillary return  WNL.  Temperature gradient is  WNL.  Skin turgor  WNL  Sensorium  Senn Weinstein monofilament wire  WNL. Normal tactile sensation.  Nail Exam  Patient has normal nails with no evidence of bacterial or fungal infection.  Orthopedic  Exam  Muscle tone and muscle strength  WNL.  No limitations of motion feet  B/L.  No crepitus or joint effusion noted.  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  Pes planus with medial bulge   B/L  Skin  No open lesions.  Normal skin texture and turgor.  Porokeratosis right forefoot.  Capsulitis right foot.  Pes planus.     ROV  Debride callus with # 15 blade.  Gave her additional padding for her callus.  RTC 3 months for evaluation of her callus.   Gardiner Barefoot DPM

## 2021-01-15 ENCOUNTER — Ambulatory Visit: Payer: Medicare Other

## 2021-02-03 ENCOUNTER — Other Ambulatory Visit: Payer: Self-pay | Admitting: Family Medicine

## 2021-02-03 DIAGNOSIS — R5381 Other malaise: Secondary | ICD-10-CM

## 2021-02-04 ENCOUNTER — Other Ambulatory Visit: Payer: Self-pay | Admitting: Family Medicine

## 2021-02-04 DIAGNOSIS — E2839 Other primary ovarian failure: Secondary | ICD-10-CM

## 2021-02-12 ENCOUNTER — Other Ambulatory Visit: Payer: Self-pay

## 2021-02-12 ENCOUNTER — Ambulatory Visit
Admission: RE | Admit: 2021-02-12 | Discharge: 2021-02-12 | Disposition: A | Discharge status: Home / Self Care | Payer: Medicaid Other | Source: Ambulatory Visit | Attending: Family Medicine | Admitting: Family Medicine

## 2021-02-12 DIAGNOSIS — Z1231 Encounter for screening mammogram for malignant neoplasm of breast: Secondary | ICD-10-CM

## 2021-02-18 ENCOUNTER — Ambulatory Visit
Admission: RE | Admit: 2021-02-18 | Discharge: 2021-02-18 | Disposition: A | Discharge status: Home / Self Care | Payer: Medicare Other | Source: Ambulatory Visit | Attending: Family Medicine | Admitting: Family Medicine

## 2021-02-18 ENCOUNTER — Other Ambulatory Visit: Payer: Self-pay

## 2021-02-18 DIAGNOSIS — E2839 Other primary ovarian failure: Secondary | ICD-10-CM

## 2021-04-02 ENCOUNTER — Encounter: Payer: Self-pay | Admitting: Orthopaedic Surgery

## 2021-04-02 ENCOUNTER — Ambulatory Visit (INDEPENDENT_AMBULATORY_CARE_PROVIDER_SITE_OTHER): Payer: Medicare Other | Admitting: Orthopaedic Surgery

## 2021-04-02 ENCOUNTER — Other Ambulatory Visit: Payer: Self-pay

## 2021-04-02 VITALS — BP 143/80 | HR 65 | Ht 63.0 in | Wt 181.0 lb

## 2021-04-02 DIAGNOSIS — M545 Low back pain, unspecified: Secondary | ICD-10-CM

## 2021-04-02 DIAGNOSIS — G8929 Other chronic pain: Secondary | ICD-10-CM

## 2021-04-02 NOTE — Progress Notes (Signed)
Office Visit Note   Patient: Carol Wilkins           Date of Birth: 11-Jun-1955           MRN: [id number] Visit Date: 04/02/2021              Requested by: Roselee Nova, MD [place],  [place] [address] PCP: Roselee Nova, MD   Assessment & Plan: Visit Diagnoses: No diagnosis found.  Plan: Patient's last MRI scan was 1 year 11 months ago.  She needs new updated MRI scan and would likely need a two-level instrumented fusion.  With her body habitus would like to see Dr. [name] after scan and for discussion of preoperative planning.  Patient is progressed to the point where she states she is now ready for surgery..  Follow-Up Instructions: No follow-ups on file.   Orders:  No orders of the defined types were placed in this encounter.  No orders of the defined types were placed in this encounter.     Procedures: No procedures performed   Clinical Data: No additional findings.   Subjective: Chief Complaint  Patient presents with   Lower Back - Pain   Left Leg - Pain    HPI 65 year old female with back left hip pain and claudication symptoms.  She gets relief with standing has to lean on a grocery cart when she goes in the store and cannot walk without it through the store.  She is better when she is supine.  She is use Tylenol Tylenol arthritis.  She has seen Dr. Phylliss Bob and it had multiple injections which initially gave her temporary relief.  She states recently they are not effective.  Her symptoms are progressing she has hard time getting upright position for walking.  Patient is here and states she wants to transfer her care.  January 2021 MRI scan showed severe stenosis at L3-4 and L4-5 with facet degenerative anterolisthesis at both levels.  She also had some right L5-S1 lateral recess stenosis.  Patient's principal pain is been with bilateral leg weakness and more pain left than right.  Review of Systems previous problems of  posterior tibial tendon dysfunction.  Pes planus, hypertension depression.   Objective: Vital Signs: BP (!) 143/80    Pulse 65    Ht 5\' 3"  (1.6 m)    Wt 181 lb (82.1 kg)    BMI 32.06 kg/m   Physical Exam Constitutional:      Appearance: She is well-developed.  HENT:     Head: Normocephalic.     Right Ear: External ear normal.     Left Ear: External ear normal. There is no impacted cerumen.  Eyes:     Pupils: Pupils are equal, round, and reactive to light.  Neck:     Thyroid: No thyromegaly.     Trachea: No tracheal deviation.  Cardiovascular:     Rate and Rhythm: Normal rate.  Pulmonary:     Effort: Pulmonary effort is normal.  Abdominal:     Palpations: Abdomen is soft.  Musculoskeletal:     Cervical back: No rigidity.  Skin:    General: Skin is warm and dry.  Neurological:     Mental Status: She is alert and oriented to person, place, and time.  Psychiatric:        Behavior: Behavior normal.    Ortho Exam patient has some sciatic notch tenderness pain with straight leg raising 90 degrees.  With  ambulation she stays flexed in the lumbar spine and hips.  Anterior tib gastrocsoleus is strong.  Specialty Comments:  No specialty comments available.  Imaging: Narrative & Impression  CLINICAL DATA:  Initial evaluation for low back pain with left hip and lower extremity pain for 18 months.   EXAM: MRI LUMBAR SPINE WITHOUT CONTRAST   TECHNIQUE: Multiplanar, multisequence MR imaging of the lumbar spine was performed. No intravenous contrast was administered.   COMPARISON:  None available.   FINDINGS: Segmentation: Standard. Lowest well-formed disc space labeled the L5-S1 level.   Alignment: 3 mm anterolisthesis of L3 on L4, with 5 mm anterolisthesis of L4 on L5, and 3 mm anterolisthesis of L5 on S1. Findings are chronic and facet mediated. Underlying mild scoliosis with exaggeration of the normal lumbar lordosis.   Vertebrae: Vertebral body height maintained  without evidence for acute or chronic fracture. Bone marrow signal intensity is markedly heterogeneous without discrete or worrisome osseous lesion. No abnormal marrow edema.   Conus medullaris and cauda equina: Conus extends to the L2 level. Conus and cauda equina appear normal.   Paraspinal and other soft tissues: Paraspinous soft tissues within normal limits. Visualized visceral structures are normal.   Disc levels:   T12-L1: Unremarkable.   L1-2: Negative interspace. Mild to moderate facet hypertrophy. No stenosis.   L2-3: Negative interspace. Moderate facet hypertrophy. No significant stenosis.   L3-4: 3 mm anterolisthesis. Mild diffuse disc bulge with disc desiccation. Advanced bilateral facet hypertrophy. Resultant severe spinal stenosis with bilateral subarticular narrowing. Mild bilateral L3 foraminal narrowing.   L4-5: 5 mm anterolisthesis. Diffuse disc bulge with disc desiccation. Small central annular fissure noted. Severe bilateral facet degeneration. Resultant severe canal with bilateral subarticular stenosis. Mild bilateral L4 foraminal narrowing.   L5-S1: 3 mm anterolisthesis. Mild disc bulge with disc desiccation. Right-sided reactive endplate changes with marginal endplate osteophytic spurring. Severe bilateral facet hypertrophy. Resultant mild right lateral recess stenosis. Central canal remains patent. Mild right L5 foraminal narrowing. No significant left foraminal encroachment.   IMPRESSION: 1. Chronic grade 1 facet mediated anterolisthesis of L3 on L4 and L4 on L5 with associated severe spinal stenosis. 2. Mild disc bulging with severe facet arthrosis at L5-S1 with resultant mild right lateral recess stenosis. 3. Moderate facet hypertrophy elsewhere within the lumbar spine without significant stenosis or impingement.     Electronically Signed   By: Jeannine Boga M.D.   On: 05/06/2019 05:27     PMFS History: Patient Active Problem  List   Diagnosis Date Noted   Colon cancer screening 09/09/2020   Constipation 09/09/2020   Flatulence, eructation and gas pain 09/09/2020   Irritable bowel syndrome 09/09/2020   Personal history of colonic polyps 09/09/2020   Rectal bleeding 09/09/2020   Pes planus 09/09/2020   Posterior tibial tendon dysfunction (PTTD) of both lower extremities 09/09/2020   Hav (hallux abducto valgus), unspecified laterality 09/09/2020   Capsulitis 09/09/2020   Low back pain 01/25/2012   Abdominal pain 11/02/2011   Obesity (BMI 30.0-34.9) 10/31/2011   Post herpetic neuralgia 09/14/2011   DEPRESSION 12/22/2006   HYPERTENSION 12/22/2006   ALLERGIC RHINITIS 12/22/2006   PALPITATIONS, HX OF 12/22/2006   Past Medical History:  Diagnosis Date   Anxiety attack    Diabetes mellitus without complication ([place])    type 2 on metformin   High cholesterol    Hypertension     Family History  Problem Relation Age of Onset   Heart disease Mother    Diabetes Mother  Hypertension Mother    Diabetes Father    Hypertension Father    Diabetes Sister    Hypertension Sister    Heart disease Sister    Diabetes Brother    Hypertension Brother    Heart disease Brother     Past Surgical History:  Procedure Laterality Date   ABDOMINAL HYSTERECTOMY     in early 16's   CHOLECYSTECTOMY     in the 88's   TEE WITHOUT CARDIOVERSION N/A 07/14/2015   Procedure: TRANSESOPHAGEAL ECHOCARDIOGRAM (TEE);  Surgeon: Adrian Prows, MD;  Location: [facility] ENDOSCOPY;  Service: Cardiovascular;  Laterality: N/A;   [place]   Social History   Occupational History   Not on file  Tobacco Use   Smoking status: Former    Types: Cigarettes    Quit date: 09/13/1980    Years since quitting: 40.5   Smokeless tobacco: Never  Substance and Sexual Activity   Alcohol use: No   Drug use: No   Sexual activity: Yes    Partners: Male    Birth control/protection: Surgical

## 2021-04-15 ENCOUNTER — Telehealth: Payer: Self-pay | Admitting: Orthopaedic Surgery

## 2021-04-15 NOTE — Telephone Encounter (Signed)
Verified location referral was sent to via [place]. Pt has contact information and will call back after sch'ing appt for a follow up with [facility].

## 2021-04-15 NOTE — Telephone Encounter (Signed)
Pt calling to get information about her mri that is supposed to be sch'd

## 2021-04-21 ENCOUNTER — Encounter: Payer: Self-pay | Admitting: Podiatry

## 2021-04-21 ENCOUNTER — Ambulatory Visit (INDEPENDENT_AMBULATORY_CARE_PROVIDER_SITE_OTHER): Payer: Commercial Managed Care - HMO | Admitting: Podiatry

## 2021-04-21 ENCOUNTER — Other Ambulatory Visit: Payer: Self-pay

## 2021-04-21 DIAGNOSIS — M779 Enthesopathy, unspecified: Secondary | ICD-10-CM

## 2021-04-21 DIAGNOSIS — M2142 Flat foot [pes planus] (acquired), left foot: Secondary | ICD-10-CM | POA: Diagnosis not present

## 2021-04-21 DIAGNOSIS — M2141 Flat foot [pes planus] (acquired), right foot: Secondary | ICD-10-CM

## 2021-04-21 NOTE — Progress Notes (Signed)
This patient returns to the office for continued evaluation and treatment of capsulitis right foot as well as pes planus bilaterally.  She was seen before and treated with insoles that were applied that she wears as well as padding that was applied to the insoles.  She returns to the office and states that she is markedly improved but still has pain noted and a callus on the bottom of her right forefoot.  She is very pleased with her improvement.  She returns to the office for continued evaluation and treatment of her diabetic feet.  Vascular  Dorsalis pedis and posterior tibial pulses are palpable  B/L.  Capillary return  WNL.  Temperature gradient is  WNL.  Skin turgor  WNL  Sensorium  Senn Weinstein monofilament wire  WNL. Normal tactile sensation.  Nail Exam  Patient has normal nails with no evidence of bacterial or fungal infection.  Orthopedic  Exam  Muscle tone and muscle strength  WNL.  No limitations of motion feet  B/L.  No crepitus or joint effusion noted.  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  Pes planus with medial bulge   B/L  Skin  No open lesions.  Normal skin texture and turgor.  Porokeratosis right forefoot resolving.  Capsulitis right foot.  Pes planus.     ROV    Gave her additional padding for her callus.  RTC 6  months for evaluation of her callus.   Gardiner Barefoot DPM

## 2021-04-28 ENCOUNTER — Other Ambulatory Visit: Payer: Self-pay

## 2021-04-28 ENCOUNTER — Ambulatory Visit
Admission: RE | Admit: 2021-04-28 | Discharge: 2021-04-28 | Disposition: A | Discharge status: Home / Self Care | Payer: Commercial Managed Care - HMO | Source: Ambulatory Visit | Attending: Orthopaedic Surgery | Admitting: Orthopaedic Surgery

## 2021-04-28 DIAGNOSIS — G8929 Other chronic pain: Secondary | ICD-10-CM

## 2021-04-28 DIAGNOSIS — M545 Low back pain, unspecified: Secondary | ICD-10-CM

## 2021-05-06 ENCOUNTER — Ambulatory Visit (INDEPENDENT_AMBULATORY_CARE_PROVIDER_SITE_OTHER): Payer: Commercial Managed Care - HMO | Admitting: Specialist

## 2021-05-06 ENCOUNTER — Ambulatory Visit: Payer: Self-pay

## 2021-05-06 ENCOUNTER — Encounter: Payer: Self-pay | Admitting: Specialist

## 2021-05-06 VITALS — BP 149/83 | HR 65 | Ht 63.0 in | Wt 181.0 lb

## 2021-05-06 DIAGNOSIS — R29898 Other symptoms and signs involving the musculoskeletal system: Secondary | ICD-10-CM

## 2021-05-06 DIAGNOSIS — G8929 Other chronic pain: Secondary | ICD-10-CM

## 2021-05-06 DIAGNOSIS — M4125 Other idiopathic scoliosis, thoracolumbar region: Secondary | ICD-10-CM

## 2021-05-06 DIAGNOSIS — M4316 Spondylolisthesis, lumbar region: Secondary | ICD-10-CM | POA: Diagnosis not present

## 2021-05-06 DIAGNOSIS — M48062 Spinal stenosis, lumbar region with neurogenic claudication: Secondary | ICD-10-CM

## 2021-05-06 DIAGNOSIS — M5416 Radiculopathy, lumbar region: Secondary | ICD-10-CM | POA: Diagnosis not present

## 2021-05-06 DIAGNOSIS — M545 Low back pain, unspecified: Secondary | ICD-10-CM

## 2021-05-06 MED ORDER — GABAPENTIN 100 MG PO CAPS: 100.0000 mg | ORAL_CAPSULE | Freq: Every day | ORAL | 1 refills | Status: DC

## 2021-05-06 NOTE — Progress Notes (Signed)
Office Visit Note   Patient: Carol Wilkins           Date of Birth: 1955/06/09           MRN: [id number] Visit Date: 05/06/2021              Requested by: Marybelle Killings, MD [place],  [place] [address] PCP: Roselee Nova, MD   Assessment & Plan: Visit Diagnoses:  1. Chronic bilateral low back pain, unspecified whether sciatica present   2. Spondylolisthesis at L3-L4 level   3. Left leg weakness   4. Radiculopathy, lumbar region   5. Spinal stenosis of lumbar region with neurogenic claudication     Plan: Avoid bending, stooping and avoid lifting weights greater than 10 lbs. Avoid prolong standing and walking. Order for a new walker with wheels. Surgery scheduling secretary Kandice Hams, will call you in the next week to schedule for surgery.  Surgery recommended is a two level lumbar fusion L3-4 and L4-5 this would be done with rods, screws and cages with local bone graft and allograft (donor bone graft). Take hydrocodone for for pain. Risk of surgery includes risk of infection 1 in 200 patients, bleeding 1/2% chance you would need a transfusion.   Risk to the nerves is one in 10,000. You will need to use a brace for 3 months and wean from the brace on the 4th month. Expect improved walking and standing tolerance. Expect relief of leg pain but numbness may persist depending on the length and degree of pressure that has been present.    Follow-Up Instructions: No follow-ups on file.   Orders:  Orders Placed This Encounter  Procedures   XR Lumbar Spine 2-3 Views   No orders of the defined types were placed in this encounter.     Procedures: No procedures performed   Clinical Data: No additional findings.   Subjective: Chief Complaint  Patient presents with   Lower Back - Pain   Left Leg - Pain    66 year old female with back pain greater than leg pain for several years and has had 2 MRIs and been seen by Dr.Wang and Dr. Lynann Bologna at  [facility]. She has had ESIs by Dr. Jacelyn Grip with good relief for 1-2 days up to a couple weeks at best. Pain is worse with standing and walking and improves with sitting. Has been to PT at [facility]. [facility] outpatient therapy department. She reports the pain is now into the left buttock and left posterolateral thigh and calf, not so much the foot. Sitting is better sometimes but not always. There is pain at night especially with turning over.  No bowel or bladder difficulty. Can not walk a mile and has to lean on grocery cart at the the grocery store. There is paresthesias and pain. On a scale of 1-10 it is a "10" when present.   Review of Systems  Constitutional: Negative.   HENT: Negative.    Eyes: Negative.   Respiratory: Negative.    Cardiovascular: Negative.   Gastrointestinal: Negative.   Endocrine: Negative.   Genitourinary: Negative.   Musculoskeletal: Negative.   Skin: Negative.   Allergic/Immunologic: Negative.   Neurological: Negative.   Hematological: Negative.   Psychiatric/Behavioral: Negative.      Objective: Vital Signs: BP (!) 149/83 (BP Location: Left Arm, Patient Position: Sitting)    Pulse 65    Ht 5\' 3"  (1.6 m)    Wt 181 lb (82.1  kg)    BMI 32.06 kg/m   Physical Exam Constitutional:      Appearance: She is well-developed.  HENT:     Head: Normocephalic and atraumatic.  Eyes:     Pupils: Pupils are equal, round, and reactive to light.  Pulmonary:     Effort: Pulmonary effort is normal.     Breath sounds: Normal breath sounds.  Abdominal:     General: Bowel sounds are normal.     Palpations: Abdomen is soft.  Musculoskeletal:     Cervical back: Normal range of motion and neck supple.     Lumbar back: Negative right straight leg raise test and negative left straight leg raise test.  Skin:    General: Skin is warm and dry.  Neurological:     Mental Status: She is alert and oriented to person, place, and time.  Psychiatric:        Behavior: Behavior  normal.        Thought Content: Thought content normal.        Judgment: Judgment normal.   Back Exam   Tenderness  The patient is experiencing tenderness in the lumbar.  Range of Motion  Extension:  abnormal  Flexion:  abnormal  Lateral bend right:  abnormal  Lateral bend left:  abnormal  Rotation right:  abnormal  Rotation left:  abnormal   Muscle Strength  Right Quadriceps:  5/5  Left Quadriceps:  4/5  Right Hamstrings:  5/5  Left Hamstrings:  5/5   Tests  Straight leg raise right: negative Straight leg raise left: negative  Other  Toe walk: normal Heel walk: normal Sensation: normal    Specialty Comments:  No specialty comments available.  Imaging: No results found.   PMFS History: Patient Active Problem List   Diagnosis Date Noted   Colon cancer screening 09/09/2020   Constipation 09/09/2020   Flatulence, eructation and gas pain 09/09/2020   Irritable bowel syndrome 09/09/2020   Personal history of colonic polyps 09/09/2020   Rectal bleeding 09/09/2020   Pes planus 09/09/2020   Posterior tibial tendon dysfunction (PTTD) of both lower extremities 09/09/2020   Hav (hallux abducto valgus), unspecified laterality 09/09/2020   Capsulitis 09/09/2020   Low back pain 01/25/2012   Abdominal pain 11/02/2011   Obesity (BMI 30.0-34.9) 10/31/2011   Post herpetic neuralgia 09/14/2011   DEPRESSION 12/22/2006   HYPERTENSION 12/22/2006   ALLERGIC RHINITIS 12/22/2006   PALPITATIONS, HX OF 12/22/2006   Past Medical History:  Diagnosis Date   Anxiety attack    Diabetes mellitus without complication ([place])    type 2 on metformin   High cholesterol    Hypertension     Family History  Problem Relation Age of Onset   Heart disease Mother    Diabetes Mother    Hypertension Mother    Diabetes Father    Hypertension Father    Diabetes Sister    Hypertension Sister    Heart disease Sister    Diabetes Brother    Hypertension  Brother    Heart disease Brother     Past Surgical History:  Procedure Laterality Date   ABDOMINAL HYSTERECTOMY     in early 75's   CHOLECYSTECTOMY     in the 1's   TEE WITHOUT CARDIOVERSION N/A 07/14/2015   Procedure: TRANSESOPHAGEAL ECHOCARDIOGRAM (TEE);  Surgeon: Adrian Prows, MD;  Location: [facility];  Service: Cardiovascular;  Laterality: N/A;   [place]   Social History  Occupational History   Not on file  Tobacco Use   Smoking status: Former    Types: Cigarettes    Quit date: 09/13/1980    Years since quitting: 40.6   Smokeless tobacco: Never  Substance and Sexual Activity   Alcohol use: No   Drug use: No   Sexual activity: Yes    Partners: Male    Birth control/protection: Surgical

## 2021-05-06 NOTE — Patient Instructions (Signed)
Plan: Avoid bending, stooping and avoid lifting weights greater than 10 lbs. Avoid prolong standing and walking. Order for a new walker with wheels. Surgery scheduling secretary Kandice Hams, will call you in the next week to schedule for surgery.  Surgery recommended is a two level lumbar fusion L3-4 and L4-5 this would be done with rods, screws and cages with local bone graft and allograft (donor bone graft). Take hydrocodone for for pain. Risk of surgery includes risk of infection 1 in 200 patients, bleeding 1/2% chance you would need a transfusion.   Risk to the nerves is one in 10,000. You will need to use a brace for 3 months and wean from the brace on the 4th month. Expect improved walking and standing tolerance. Expect relief of leg pain but numbness may persist depending on the length and degree of pressure that has been present.

## 2021-06-24 ENCOUNTER — Other Ambulatory Visit (HOSPITAL_COMMUNITY): Payer: Self-pay | Admitting: Family Medicine

## 2021-06-24 ENCOUNTER — Other Ambulatory Visit: Payer: Self-pay | Admitting: Family Medicine

## 2021-06-25 ENCOUNTER — Other Ambulatory Visit: Payer: Self-pay | Admitting: Family Medicine

## 2021-06-25 DIAGNOSIS — I6523 Occlusion and stenosis of bilateral carotid arteries: Secondary | ICD-10-CM

## 2021-06-28 ENCOUNTER — Other Ambulatory Visit (HOSPITAL_COMMUNITY): Payer: Self-pay | Admitting: Family Medicine

## 2021-06-28 ENCOUNTER — Other Ambulatory Visit: Payer: Commercial Managed Care - HMO

## 2021-06-30 ENCOUNTER — Other Ambulatory Visit: Payer: Self-pay | Admitting: Specialist

## 2021-06-30 ENCOUNTER — Ambulatory Visit
Admission: RE | Admit: 2021-06-30 | Discharge: 2021-06-30 | Disposition: A | Discharge status: Home / Self Care | Payer: Medicare Other | Source: Ambulatory Visit | Attending: Family Medicine | Admitting: Family Medicine

## 2021-06-30 DIAGNOSIS — I6523 Occlusion and stenosis of bilateral carotid arteries: Secondary | ICD-10-CM

## 2021-07-01 ENCOUNTER — Other Ambulatory Visit (HOSPITAL_COMMUNITY): Payer: Self-pay | Admitting: Family Medicine

## 2021-07-08 ENCOUNTER — Ambulatory Visit (HOSPITAL_COMMUNITY)
Admission: RE | Admit: 2021-07-08 | Discharge: 2021-07-08 | Disposition: A | Discharge status: Home / Self Care | Payer: Medicare Other | Source: Ambulatory Visit | Attending: Family Medicine | Admitting: Family Medicine

## 2021-07-08 ENCOUNTER — Other Ambulatory Visit: Payer: Self-pay

## 2021-07-08 DIAGNOSIS — I10 Essential (primary) hypertension: Secondary | ICD-10-CM | POA: Diagnosis not present

## 2021-07-09 LAB — ECHOCARDIOGRAM COMPLETE
AR max vel: 2.23 cm2
AV Peak grad: 14.4 mmHg
Ao pk vel: 1.9 m/s
Area-P 1/2: 2.79 cm2
S' Lateral: 2 cm

## 2021-08-05 ENCOUNTER — Other Ambulatory Visit: Payer: Self-pay

## 2021-08-12 ENCOUNTER — Ambulatory Visit (INDEPENDENT_AMBULATORY_CARE_PROVIDER_SITE_OTHER): Payer: Medicare Other | Admitting: Specialist

## 2021-08-12 ENCOUNTER — Encounter: Payer: Self-pay | Admitting: Specialist

## 2021-08-12 VITALS — BP 114/71 | HR 70 | Ht 63.0 in | Wt 181.0 lb

## 2021-08-12 DIAGNOSIS — R29898 Other symptoms and signs involving the musculoskeletal system: Secondary | ICD-10-CM | POA: Diagnosis not present

## 2021-08-12 DIAGNOSIS — G8929 Other chronic pain: Secondary | ICD-10-CM

## 2021-08-12 DIAGNOSIS — M545 Low back pain, unspecified: Secondary | ICD-10-CM

## 2021-08-12 DIAGNOSIS — M4316 Spondylolisthesis, lumbar region: Secondary | ICD-10-CM | POA: Diagnosis not present

## 2021-08-12 DIAGNOSIS — M48062 Spinal stenosis, lumbar region with neurogenic claudication: Secondary | ICD-10-CM

## 2021-08-12 DIAGNOSIS — M4125 Other idiopathic scoliosis, thoracolumbar region: Secondary | ICD-10-CM

## 2021-08-12 DIAGNOSIS — M5416 Radiculopathy, lumbar region: Secondary | ICD-10-CM | POA: Diagnosis not present

## 2021-08-12 MED ORDER — HYDROCODONE-ACETAMINOPHEN 10-325 MG PO TABS: 0.5000 | ORAL_TABLET | Freq: Four times a day (QID) | ORAL | 0 refills | Status: DC | PRN

## 2021-08-12 NOTE — Patient Instructions (Signed)
Plan: Avoid bending, stooping and avoid lifting weights greater than 10 lbs. ?Avoid prolong standing and walking. ?Order for a new walker with wheels. ?Surgery scheduling secretary Kandice Hams, will call you in the next week to schedule for surgery.  ?Surgery recommended is a two level lumbar fusion left L3-4 and left L4-5 this would be done with rods, screws and cages with local bone graft and allograft (donor bone graft). ?Take hydrocodone for for pain. ?Risk of surgery includes risk of infection 1 in 200 patients, bleeding 1/2% chance you would need a transfusion.   Risk to the nerves is one in 10,000. ?You will need to use a brace for 3 months and wean from the brace on the 4th month. ?Expect improved walking and standing tolerance. Expect relief of leg pain but numbness ?may persist depending on the length and degree of pressure that has been present. ?

## 2021-08-12 NOTE — Progress Notes (Signed)
? ?Office Visit Note ?  ?Patient: Carol Wilkins           ?Date of Birth: [birth year]           ?MRN: [id number] ?Visit Date: 08/12/2021 ?             ?Requested by: Roselee Nova, MD ?[address] ?[place],  [place] [address] ?PCP: Roselee Nova, MD ? ? ?Assessment & Plan: ?Visit Diagnoses:  ?1. Chronic bilateral low back pain, unspecified whether sciatica present   ?2. Spondylolisthesis at L3-L4 level   ?3. Left leg weakness   ?4. Radiculopathy, lumbar region   ?5. Spinal stenosis of lumbar region with neurogenic claudication   ?6. Other idiopathic scoliosis, thoracolumbar region   ? ? ?Plan: Avoid bending, stooping and avoid lifting weights greater than 10 lbs. ?Avoid prolong standing and walking. ?Order for a new walker with wheels. ?Surgery scheduling secretary Kandice Hams, will call you in the next week to schedule for surgery.  ?Surgery recommended is a two level lumbar fusion left L3-4 and left L4-5 this would be done with rods, screws and cages with local bone graft and allograft (donor bone graft). ?Take hydrocodone for for pain. ?Risk of surgery includes risk of infection 1 in 200 patients, bleeding 1/2% chance you would need a transfusion.   Risk to the nerves is one in 10,000. ?You will need to use a brace for 3 months and wean from the brace on the 4th month. ?Expect improved walking and standing tolerance. Expect relief of leg pain but numbness ?may persist depending on the length and degree of pressure that has been present. ?  ? ?Follow-Up Instructions: No follow-ups on file.  ? ?Orders:  ?No orders of the defined types were placed in this encounter. ? ?Meds ordered this encounter  ?Medications  ? HYDROcodone-acetaminophen (NORCO) 10-325 MG tablet  ?  Sig: Take 0.5 tablets by mouth every 6 (six) hours as needed for moderate pain.  ?  Dispense:  30 tablet  ?  Refill:  0  ? ? ? ? Procedures: ?No procedures performed ? ? ?Clinical Data: ?No additional findings. ? ? ?Subjective: ?Chief  Complaint  ?Patient presents with  ? Lower Back - Follow-up  ?  Scheduled for surgery 08/31/21  ? ? ?66 year old female with history of lumbar spondylolisthesis and spinal stenosis. She is scheduled for surgery 08/31/2021 but is having increasing pain into the left lumbar area. No bowel or bladder difficulties. Pain is greater with standing and walking and with bending and stooping and with standing in one place.  She is here due to worse pain.  ? ?Review of Systems  ?Constitutional: Negative.   ?HENT:  Positive for rhinorrhea, sinus pressure and sinus pain.   ?Eyes: Negative.   ?Respiratory: Negative.    ?Cardiovascular: Negative.   ?Gastrointestinal: Negative.   ?Endocrine: Negative.   ?Genitourinary: Negative.   ?Musculoskeletal: Negative.   ?Skin: Negative.   ?Allergic/Immunologic: Negative.   ?Neurological: Negative.   ?Hematological: Negative.   ?Psychiatric/Behavioral: Negative.    ? ? ?Objective: ?Vital Signs: BP 114/71 (BP Location: Left Arm, Patient Position: Sitting)   Pulse 70   Ht '5\' 3"'$  (1.6 m)   Wt 181 lb (82.1 kg)   BMI 32.06 kg/m?  ? ?Physical Exam ?Constitutional:   ?   Appearance: She is well-developed.  ?HENT:  ?   Head: Normocephalic and atraumatic.  ?Eyes:  ?   Pupils: Pupils are equal, round, and reactive  to light.  ?Pulmonary:  ?   Effort: Pulmonary effort is normal.  ?   Breath sounds: Normal breath sounds.  ?Abdominal:  ?   General: Bowel sounds are normal.  ?   Palpations: Abdomen is soft.  ?Musculoskeletal:  ?   Cervical back: Normal range of motion and neck supple.  ?   Lumbar back: Negative right straight leg raise test and negative left straight leg raise test.  ?Skin: ?   General: Skin is warm and dry.  ?Neurological:  ?   Mental Status: She is alert and oriented to person, place, and time.  ?Psychiatric:     ?   Behavior: Behavior normal.     ?   Thought Content: Thought content normal.     ?   Judgment: Judgment normal.  ? ?Back Exam  ? ?Tenderness  ?The patient is experiencing  tenderness in the lumbar. ? ?Range of Motion  ?Extension:  abnormal  ?Flexion:  abnormal  ?Lateral bend right:  abnormal  ?Lateral bend left:  abnormal  ?Rotation right:  abnormal  ?Rotation left:  abnormal  ? ?Muscle Strength  ?Right Quadriceps:  5/5  ?Left Quadriceps:  5/5  ?Right Hamstrings:  5/5  ?Left Hamstrings:  5/5  ? ?Tests  ?Straight leg raise right: negative ?Straight leg raise left: negative ? ?Other  ?Toe walk: abnormal ?Heel walk: abnormal ? ? ? ?Specialty Comments:  ?No specialty comments available. ? ?Imaging: ?No results found. ? ? ?PMFS History: ?Patient Active Problem List  ? Diagnosis Date Noted  ? Colon cancer screening 09/09/2020  ? Constipation 09/09/2020  ? Flatulence, eructation and gas pain 09/09/2020  ? Irritable bowel syndrome 09/09/2020  ? Personal history of colonic polyps 09/09/2020  ? Rectal bleeding 09/09/2020  ? Pes planus 09/09/2020  ? Posterior tibial tendon dysfunction (PTTD) of both lower extremities 09/09/2020  ? Hav (hallux abducto valgus), unspecified laterality 09/09/2020  ? Capsulitis 09/09/2020  ? Low back pain 01/25/2012  ? Abdominal pain 11/02/2011  ? Obesity (BMI 30.0-34.9) 10/31/2011  ? Post herpetic neuralgia 09/14/2011  ? DEPRESSION 12/22/2006  ? HYPERTENSION 12/22/2006  ? ALLERGIC RHINITIS 12/22/2006  ? PALPITATIONS, HX OF 12/22/2006  ? ?Past Medical History:  ?Diagnosis Date  ? Anxiety attack   ? Diabetes mellitus without complication ([facility])   ? type 2 on metformin  ? High cholesterol   ? Hypertension   ?  ?Family History  ?Problem Relation Age of Onset  ? Heart disease Mother   ? Diabetes Mother   ? Hypertension Mother   ? Diabetes Father   ? Hypertension Father   ? Diabetes Sister   ? Hypertension Sister   ? Heart disease Sister   ? Diabetes Brother   ? Hypertension Brother   ? Heart disease Brother   ?  ?Past Surgical History:  ?Procedure Laterality Date  ? ABDOMINAL HYSTERECTOMY    ? in early 5's  ? CHOLECYSTECTOMY    ? in the 90's  ? TEE WITHOUT CARDIOVERSION  N/A 07/14/2015  ? Procedure: TRANSESOPHAGEAL ECHOCARDIOGRAM (TEE);  Surgeon: Adrian Prows, MD;  Location: [facility];  Service: Cardiovascular;  Laterality: N/A;  ? TUBAL LIGATION    ? TUBAL LIGATION  1980  ? ?Social History  ? ?Occupational History  ? Not on file  ?Tobacco Use  ? Smoking status: Former  ?  Types: Cigarettes  ?  Quit date: 09/13/1980  ?  Years since quitting: 40.9  ? Smokeless tobacco: Never  ?Substance and Sexual Activity  ?  Alcohol use: No  ? Drug use: No  ? Sexual activity: Yes  ?  Partners: Male  ?  Birth control/protection: Surgical  ? ? ? ? ? ? ?

## 2021-08-25 ENCOUNTER — Ambulatory Visit (INDEPENDENT_AMBULATORY_CARE_PROVIDER_SITE_OTHER): Payer: Medicare Other | Admitting: Surgery

## 2021-08-25 VITALS — BP 133/71 | HR 67 | Ht 62.5 in | Wt 180.0 lb

## 2021-08-25 DIAGNOSIS — M48062 Spinal stenosis, lumbar region with neurogenic claudication: Secondary | ICD-10-CM

## 2021-08-25 DIAGNOSIS — M4316 Spondylolisthesis, lumbar region: Secondary | ICD-10-CM

## 2021-08-25 DIAGNOSIS — M5416 Radiculopathy, lumbar region: Secondary | ICD-10-CM

## 2021-08-25 NOTE — Pre-Procedure Instructions (Signed)
Surgical Instructions ? ? ? Your procedure is scheduled on Tuesday, May 16th. ? Report to [facility] Main Entrance "A" at 05:30 A.M., then check in with the Admitting office. ? Call this number if you have problems the morning of surgery: ? [phone] ? ? If you have any questions prior to your surgery date call [phone]: Open Monday-Friday 8am-4pm ? ? ? Remember: ? Do not eat after midnight the night before your surgery ? ?You may drink clear liquids until 04:30 AM the morning of your surgery.   ?Clear liquids allowed are: Water, Non-Citrus Juices (without pulp), Carbonated Beverages, Clear Tea, Black Coffee Only (NO MILK, CREAM OR POWDERED CREAMER of any kind), and Gatorade. ?  ? Take these medicines the morning of surgery with A SIP OF WATER  ?amLODipine (NORVASC) ?atorvastatin (LIPITOR)  ?buPROPion [facility] SR) ? ?If needed: ?acetaminophen (TYLENOL) ?fexofenadine (ALLEGRA) ?fluticasone (FLONASE)  ?HYDROcodone-acetaminophen ([place]) ? ?As of today, STOP taking any Aspirin (unless otherwise instructed by your surgeon) Aleve, Naproxen, Ibuprofen, Motrin, Advil, Goody's, BC's, all herbal medications, fish oil, and all vitamins. ? ? ?WHAT DO I DO ABOUT MY DIABETES MEDICATION? ? ? ?Do not take metFORMIN (GLUCOPHAGE)  or Semaglutide,0.25 or 0.'5MG'$ /DOS, (OZEMPIC, 0.25 OR 0.5 MG/DOSE,) the morning of surgery. ? ? ? ?HOW TO MANAGE YOUR DIABETES ?BEFORE AND AFTER SURGERY ? ?Why is it important to control my blood sugar before and after surgery? ?Improving blood sugar levels before and after surgery helps healing and can limit problems. ?A way of improving blood sugar control is eating a healthy diet by: ? Eating less sugar and carbohydrates ? Increasing activity/exercise ? Talking with your doctor about reaching your blood sugar goals ?High blood sugars (greater than 180 mg/dL) can raise your risk of infections and slow your recovery, so you will need to focus on controlling your diabetes during the weeks before  surgery. ?Make sure that the doctor who takes care of your diabetes knows about your planned surgery including the date and location. ? ?How do I manage my blood sugar before surgery? ?Check your blood sugar at least 4 times a day, starting 2 days before surgery, to make sure that the level is not too high or low. ? ?Check your blood sugar the morning of your surgery when you wake up and every 2 hours until you get to the Short Stay unit. ? ?If your blood sugar is less than 70 mg/dL, you will need to treat for low blood sugar: ?Do not take insulin. ?Treat a low blood sugar (less than 70 mg/dL) with ? cup of clear juice (cranberry or apple), 4 glucose tablets, OR glucose gel. ?Recheck blood sugar in 15 minutes after treatment (to make sure it is greater than 70 mg/dL). If your blood sugar is not greater than 70 mg/dL on recheck, call [phone] for further instructions. ?Report your blood sugar to the short stay nurse when you get to Short Stay. ? ?If you are admitted to the hospital after surgery: ?Your blood sugar will be checked by the staff and you will probably be given insulin after surgery (instead of oral diabetes medicines) to make sure you have good blood sugar levels. ?The goal for blood sugar control after surgery is 80-180 mg/dL.  ? ?         ?           ?Do NOT Smoke (Tobacco/Vaping) for 24 hours prior to your procedure. ? ?If you use a CPAP at night, you may bring your  mask/headgear for your overnight stay. ?  ?Contacts, glasses, piercing's, hearing aid's, dentures or partials may not be worn into surgery, please bring cases for these belongings.  ?  ?For patients admitted to the hospital, discharge time will be determined by your treatment team. ?  ?Patients discharged the day of surgery will not be allowed to drive home, and someone needs to stay with them for 24 hours. ? ?SURGICAL WAITING ROOM VISITATION ?Patients having surgery or a procedure may have two support people in the waiting room. These  visitors may be switched out with other visitors if needed. ?Children under the age of 26 must have an adult accompany them who is not the patient. ?If the patient needs to stay at the hospital during part of their recovery, the visitor guidelines for inpatient rooms apply. ? ?Please refer to the [facility] website for the visitor guidelines for Inpatients (after your surgery is over and you are in a regular room).  ? ? ?Special instructions:   ?[facility]- Preparing For Surgery ? ?Before surgery, you can play an important role. Because skin is not sterile, your skin needs to be as free of germs as possible. You can reduce the number of germs on your skin by washing with CHG (chlorahexidine gluconate) Soap before surgery.  CHG is an antiseptic cleaner which kills germs and bonds with the skin to continue killing germs even after washing.   ? ?Oral Hygiene is also important to reduce your risk of infection.  Remember - BRUSH YOUR TEETH THE MORNING OF SURGERY WITH YOUR REGULAR TOOTHPASTE ? ?Please do not use if you have an allergy to CHG or antibacterial soaps. If your skin becomes reddened/irritated stop using the CHG.  ?Do not shave (including legs and underarms) for at least 48 hours prior to first CHG shower. It is OK to shave your face. ? ?Please follow these instructions carefully. ?  ?Shower the NIGHT BEFORE SURGERY and the MORNING OF SURGERY ? ?If you chose to wash your hair, wash your hair first as usual with your normal shampoo. ? ?After you shampoo, rinse your hair and body thoroughly to remove the shampoo. ? ?Use CHG Soap as you would any other liquid soap. You can apply CHG directly to the skin and wash gently with a scrungie or a clean washcloth.  ? ?Apply the CHG Soap to your body ONLY FROM THE NECK DOWN.  Do not use on open wounds or open sores. Avoid contact with your eyes, ears, mouth and genitals (private parts). Wash Face and genitals (private parts)  with your normal soap.  ? ?Wash thoroughly,  paying special attention to the area where your surgery will be performed. ? ?Thoroughly rinse your body with warm water from the neck down. ? ?DO NOT shower/wash with your normal soap after using and rinsing off the CHG Soap. ? ?Pat yourself dry with a CLEAN TOWEL. ? ?Wear CLEAN PAJAMAS to bed the night before surgery ? ?Place CLEAN SHEETS on your bed the night before your surgery ? ?DO NOT SLEEP WITH PETS. ? ? ?Day of Surgery: ?Take a shower with CHG soap. ?Do not wear jewelry or makeup ?Do not wear lotions, powders, perfumes, or deodorant. ?Do not shave 48 hours prior to surgery.   ?Do not bring valuables to the hospital.  ?[facility] is not responsible for any belongings or valuables. ?Do not wear nail polish, gel polish, artificial nails, or any other type of covering on natural nails (fingers and toes) ?If  you have artificial nails or gel coating that need to be removed by a nail salon, please have this removed prior to surgery. Artificial nails or gel coating may interfere with anesthesia's ability to adequately monitor your vital signs. ?Wear Clean/Comfortable clothing the morning of surgery ?Remember to brush your teeth WITH YOUR REGULAR TOOTHPASTE. ?  ?Please read over the following fact sheets that you were given. ? ? ? ?If you received a COVID test during your pre-op visit  it is requested that you wear a mask when out in public, stay away from anyone that may not be feeling well and notify your surgeon if you develop symptoms. If you have been in contact with anyone that has tested positive in the last 10 days please notify you surgeon.  ?

## 2021-08-25 NOTE — Progress Notes (Signed)
67 year old black female with history of L3-4 and L4-5 HNP/gnosis comes in for preop evaluation.  States that symptoms unchanged from previous visit and she is wanting to proceed with LEFT L3-4 AND L4-5 TRANSFORAMINAL LUMBAR INTERBODY FUSION WITH RODS, SCREWS AND CAGES, LOCAL BONE GRAFT, ALLOGRAFT BONE GRAFT AND VIVIGEN as scheduled.  Today history physical performed.  Review of systems negative.  Patient has a cardiologist.  We are awaiting cardiac clearance.  Surgical procedure discussed.  All questions answered. ?

## 2021-08-26 ENCOUNTER — Encounter (HOSPITAL_COMMUNITY)
Admission: RE | Admit: 2021-08-26 | Discharge: 2021-08-26 | Disposition: A | Discharge status: Home / Self Care | Payer: Medicare Other | Source: Ambulatory Visit | Attending: Specialist | Admitting: Specialist

## 2021-08-26 ENCOUNTER — Encounter (HOSPITAL_COMMUNITY): Payer: Self-pay

## 2021-08-26 ENCOUNTER — Other Ambulatory Visit: Payer: Self-pay

## 2021-08-26 VITALS — BP 143/65 | Temp 98.4°F | Resp 17 | Ht 62.0 in | Wt 175.8 lb

## 2021-08-26 DIAGNOSIS — I10 Essential (primary) hypertension: Secondary | ICD-10-CM | POA: Diagnosis not present

## 2021-08-26 DIAGNOSIS — M419 Scoliosis, unspecified: Secondary | ICD-10-CM | POA: Diagnosis not present

## 2021-08-26 DIAGNOSIS — Z87891 Personal history of nicotine dependence: Secondary | ICD-10-CM | POA: Insufficient documentation

## 2021-08-26 DIAGNOSIS — Z01818 Encounter for other preprocedural examination: Secondary | ICD-10-CM

## 2021-08-26 DIAGNOSIS — M4805 Spinal stenosis, thoracolumbar region: Secondary | ICD-10-CM | POA: Insufficient documentation

## 2021-08-26 DIAGNOSIS — E119 Type 2 diabetes mellitus without complications: Secondary | ICD-10-CM | POA: Diagnosis not present

## 2021-08-26 DIAGNOSIS — E7800 Pure hypercholesterolemia, unspecified: Secondary | ICD-10-CM | POA: Insufficient documentation

## 2021-08-26 DIAGNOSIS — M48062 Spinal stenosis, lumbar region with neurogenic claudication: Secondary | ICD-10-CM | POA: Diagnosis not present

## 2021-08-26 DIAGNOSIS — M4316 Spondylolisthesis, lumbar region: Secondary | ICD-10-CM | POA: Diagnosis not present

## 2021-08-26 DIAGNOSIS — E669 Obesity, unspecified: Secondary | ICD-10-CM | POA: Insufficient documentation

## 2021-08-26 HISTORY — DX: Unspecified osteoarthritis, unspecified site: M19.90

## 2021-08-26 LAB — CBC
HCT: 37.4 % (ref 36.0–46.0)
Hemoglobin: 11.6 g/dL — ABNORMAL LOW (ref 12.0–15.0)
MCH: 27.1 pg (ref 26.0–34.0)
MCHC: 31 g/dL (ref 30.0–36.0)
MCV: 87.4 fL (ref 80.0–100.0)
Platelets: 223 10*3/uL (ref 150–400)
RBC: 4.28 MIL/uL (ref 3.87–5.11)
RDW: 14.8 % (ref 11.5–15.5)
WBC: 4.4 10*3/uL (ref 4.0–10.5)
nRBC: 0 % (ref 0.0–0.2)

## 2021-08-26 LAB — BASIC METABOLIC PANEL
Anion gap: 9 (ref 5–15)
BUN: 15 mg/dL (ref 8–23)
CO2: 29 mmol/L (ref 22–32)
Calcium: 10.2 mg/dL (ref 8.9–10.3)
Chloride: 104 mmol/L (ref 98–111)
Creatinine, Ser: 1.09 mg/dL — ABNORMAL HIGH (ref 0.44–1.00)
GFR, Estimated: 56 mL/min — ABNORMAL LOW (ref >60)
Glucose, Bld: 122 mg/dL — ABNORMAL HIGH (ref 70–99)
Potassium: 3.9 mmol/L (ref 3.5–5.1)
Sodium: 142 mmol/L (ref 135–145)

## 2021-08-26 LAB — SURGICAL PCR SCREEN
MRSA, PCR: NEGATIVE
Staphylococcus aureus: POSITIVE — AB

## 2021-08-26 LAB — HEMOGLOBIN A1C
Hgb A1c MFr Bld: 5.8 % — ABNORMAL HIGH (ref 4.8–5.6)
Mean Plasma Glucose: 119.76 mg/dL

## 2021-08-26 LAB — GLUCOSE, CAPILLARY: Glucose-Capillary: 132 mg/dL — ABNORMAL HIGH (ref 70–99)

## 2021-08-26 NOTE — Progress Notes (Signed)
PCP - Dr. Rochel Brome ?[facility] ? ?Pt states she sees cardiology because she was told she had an "abnormal rhythm" but she's unsure what it is. Cardiology note and test records requested from Dr. Edson Snowball at [facility] center. Holter monitor from 2020 shows PVC's and PAC's. ? ?PPM/ICD - denies ? ? ?Chest x-ray - 02/01/20 ?EKG - 08/26/21 at PAT ?Stress Test - 08/18/14 ?ECHO - 07/08/21 ?Cardiac Cath - denies ? ?Sleep Study - denies ? ?DM- Type 2 ?Fasting Blood Sugar - 100-120 ?Pt says she just "spot checks" her CBG. She has a continuous sensor. ? ?ASA/Blood Thinner Instructions: n/a ? ? ?ERAS Protcol - yes, no drink ? ? ?COVID TEST- n/a ? ? ?Anesthesia review: yes, cardiac hx ? ?Patient denies shortness of breath, fever, cough and chest pain at PAT appointment ? ? ?All instructions explained to the patient, with a verbal understanding of the material. Patient agrees to go over the instructions while at home for a better understanding. Patient also instructed to notify surgeon of any contact with COVID+ person or if she develops any symptoms. The opportunity to ask questions was provided. ?  ?

## 2021-08-27 NOTE — Anesthesia Preprocedure Evaluation (Addendum)
Anesthesia Evaluation  ?Patient identified by MRN, date of birth, ID band ?Patient awake ? ? ? ?Reviewed: ?Allergy & Precautions, H&P , NPO status , Patient's Chart, lab work & pertinent test results ? ?Airway ?Mallampati: II ? ?TM Distance: >3 FB ?Neck ROM: Full ? ? ? Dental ?no notable dental hx. ?(+) Teeth Intact, Dental Advisory Given ?  ?Pulmonary ?neg pulmonary ROS, former smoker,  ?  ?Pulmonary exam normal ?breath sounds clear to auscultation ? ? ? ? ? ? Cardiovascular ?Exercise Tolerance: Good ?hypertension, Pt. on medications ? ?Rhythm:Regular Rate:Normal ? ? ?  ?Neuro/Psych ?Anxiety Depression negative neurological ROS ?   ? GI/Hepatic ?negative GI ROS, Neg liver ROS,   ?Endo/Other  ?diabetes, Type 2, Oral Hypoglycemic Agents ? Renal/GU ?negative Renal ROS  ?negative genitourinary ?  ?Musculoskeletal ? ?(+) Arthritis , Osteoarthritis,   ? Abdominal ?  ?Peds ? Hematology ?negative hematology ROS ?(+)   ?Anesthesia Other Findings ? ? Reproductive/Obstetrics ?negative OB ROS ? ?  ? ? ? ? ? ? ? ? ? ? ? ? ? ?  ?  ? ? ? ? ? ? ?Anesthesia Physical ?Anesthesia Plan ? ?ASA: 2 ? ?Anesthesia Plan: General  ? ?Post-op Pain Management: Tylenol PO (pre-op)*  ? ?Induction: Intravenous ? ?PONV Risk Score and Plan: 4 or greater and Ondansetron, Dexamethasone, Midazolam and Treatment may vary due to age or medical condition ? ?Airway Management Planned: Oral ETT ? ?Additional Equipment: Arterial line ? ?Intra-op Plan:  ? ?Post-operative Plan: Extubation in OR and Possible Post-op intubation/ventilation ? ?Informed Consent: I have reviewed the patients History and Physical, chart, labs and discussed the procedure including the risks, benefits and alternatives for the proposed anesthesia with the patient or authorized representative who has indicated his/her understanding and acceptance.  ? ? ? ?Dental advisory given ? ?Plan Discussed with: CRNA ? ?Anesthesia Plan Comments: (PAT note written  08/27/2021 by Myra Gianotti, PA-C. ?)  ? ? ? ? ?Anesthesia Quick Evaluation ? ?

## 2021-08-27 NOTE — Progress Notes (Signed)
Anesthesia Chart Review: ? Case: [id number] Date/Time: 08/31/21 0715  ? Procedure: LEFT L3-4 AND L4-5 TRANSFORAMINAL LUMBAR INTERBODY FUSION WITH RODS, SCREWS AND CAGES, LOCAL BONE GRAFT, ALLOGRAFT BONE GRAFT AND VIVIGEN  ? Anesthesia type: General  ? 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  ? Location: [facility] ROOM 18 / [facility]  ? Surgeons: Jessy Oto, MD  ? ?  ? ? ?DISCUSSION: Patient is a 66 year old female scheduled for the above procedure. ? ?History includes former smoker (quit 09/13/80), HTN, DM2, hypercholesterolemia, arthritis, anxiety, hysterectomy, cholecystectomy.  BMI is consistent with obesity. ? ?Carotid US study on 06/30/21 showed < 81% RICA, 19-14% LICA, and some variation in BP readings in LUE (146/88) and RUE (131/64). If variation persisted at follow-up then consider right brachiocephalic or [place] artery stenosis.  ? ?Medical clearance signed by PCP Dr. Manuella Ghazi, indicating patient was medically cleared for surgery, adding "Pt has decline in her kidney function, GFR at 46.  Please check BMP 1 week before surgery. Pt has mild anemia. Hgb 11.10, we will need repeat CBC 1 week before surgery." Labs on 08/26/21 showed BUN 15, Creatinine 1.09, eGFR 56, and H/H 11.6/37.4. ? ?Cardiology input by Dr. Edson Snowball indicated she was cleared from a cardiac standpoint with "no CV med changes required." There is a recent echo from early this year and a Holter monitor from 2020, but last office notes requested from [facility]. She says she was seen by Dr. Edson Snowball for "abnormal rhythm".  Had rare PACs and PVC on 2020 monitor. ? ?Anesthesia team to evaluate on the day of surgery.  ? ? ?VS: BP (!) 143/65   Temp 36.9 ?C (Oral)   Resp 17   Ht 5' 2" (1.575 m)   Wt 79.7 kg   SpO2 100%   BMI 32.15 kg/m?  ? ? ?PROVIDERS: ?Roselee Nova, MD is PCP [facility]) ?Tonia Ghent, MD is cardiologist ([place]). She  previously saw Adrian Prows, MD in ~ 2016-2017.  ? ? ?LABS: Labs reviewed: Acceptable for surgery. ?(all labs ordered are listed, but only abnormal results are displayed) ? ?Labs Reviewed  ?SURGICAL PCR SCREEN - Abnormal; Notable for the following components:  ?    Result Value  ? Staphylococcus aureus POSITIVE (*)   ? All other components within normal limits  ?GLUCOSE, CAPILLARY - Abnormal; Notable for the following components:  ? Glucose-Capillary 132 (*)   ? All other components within normal limits  ?HEMOGLOBIN A1C - Abnormal; Notable for the following components:  ? Hgb A1c MFr Bld 5.8 (*)   ? All other components within normal limits  ?BASIC METABOLIC PANEL - Abnormal; Notable for the following components:  ? Glucose, Bld 122 (*)   ? Creatinine, Ser 1.09 (*)   ? GFR, Estimated 56 (*)   ? All other components within normal limits  ?CBC - Abnormal; Notable for the following components:  ? Hemoglobin 11.6 (*)   ? All other components within normal limits  ?TYPE AND SCREEN  ? ? ?IMAGES: ?MRI L-spine 04/28/21: ?IMPRESSION: ?1. Grade 1 anterolisthesis of L3 on L4 and L4 on L5 with associated ?degenerative changes resulting in severe spinal canal stenosis with ?effacement of the subarticular zones and compression of the cauda ?equina nerve roots, unchanged since 2021. No high-grade neural ?foraminal stenosis in the lumbar spine. ?2. Multilevel facet arthropathy, most advanced at L3-L4 through ?L5-S1. No facet joint effusions or significant perifacetal soft ?  tissue edema is seen. ?  ? ?EKG: 08/26/21: ?Normal sinus rhythm ?Low voltage QRS ?Cannot rule out Anterior infarct , age undetermined ?Abnormal ECG ?When compared with ECG of 01-Feb-2020 11:38, ?PREVIOUS ECG IS PRESENT No significant change was found ?Confirmed by Sherren Mocha [phone]) on 08/26/2021 11:24:21 PM ? ? ?CV: ?US Carotid 06/30/21: ?IMPRESSION: ?1. Extensive calcified plaque at the right internal carotid artery ?origin with Doppler measurements suggesting  less than 50% stenosis. ?2. Moderate heterogeneous plaque seen at the left carotid ?bifurcation with velocity parameters indicative of 50-69% stenosis. ?3. Significant pressure measurements noted in the upper extremities ?with left arm measuring 146/68 mm Hg and right arm measuring 131/64 ?mm Hg. Recommend repeating pressure measurements in office and if ?findings persist, this is suspicious for stenosis of the right ?brachiocephalic or subclavian artery. ?  ? ?Echo 07/08/21: ?IMPRESSIONS  ? 1. Left ventricular ejection fraction, by estimation, is 60 to 65%. The  ?left ventricle has normal function. The left ventricle has no regional  ?wall motion abnormalities. Left ventricular diastolic parameters are  ?indeterminate.  ? 2. Right ventricular systolic function is normal. The right ventricular  ?size is normal. There is normal pulmonary artery systolic pressure.  ? 3. The mitral valve is normal in structure. Trivial mitral valve  ?regurgitation. No evidence of mitral stenosis.  ? 4. The aortic valve is tricuspid. Aortic valve regurgitation is not  ?visualized. No aortic stenosis is present.  ? 5. The inferior vena cava is normal in size with greater than 50%  ?respiratory variability, suggesting right atrial pressure of 3 mmHg.  ?- Comparison(s): No significant change from prior study.  ?- Conclusion(s)/Recommendation(s): Otherwise normal echocardiogram, with  ?minor abnormalities described in the report.  ? ? ?Holter Monitor for 24 hours hours 06/04/18 for palpitations: ?There were rare PACs and PVCs.  There was no correlation with patient's symptoms of palpitations.  No other significant arrhythmias or heart block. ? ? ?Nuclear stress test 05/29/15 [facility] CV): ?1. Resting EKG demonstrated normal sinus rhythm, normal axis. Low voltage complexes, poor R-wave progression. The stress electrocardiogram was normal.   The patient performed treadmill exercise using a Bruce protocol, completing 5 minutes. The patient  completed an estimated workload of 7.02 METS, 91% of the maximum predicted heart rate. Stress symptoms included dyspnea. The stress test was terminated because of fatigue.  ?2. Myocardial perfusion imaging is normal. Overall left ventricular systolic function was normal without regional wall motion abnormalities. The left ventricular ejection fraction was 77%. ? ? ?Past Medical History:  ?Diagnosis Date  ? Anxiety attack   ? Arthritis   ? Diabetes mellitus without complication ([place])   ? type 2 on metformin  ? High cholesterol   ? Hypertension   ? ? ?Past Surgical History:  ?Procedure Laterality Date  ? ABDOMINAL HYSTERECTOMY    ? in early 31's  ? CHOLECYSTECTOMY    ? in the 90's  ? TEE WITHOUT CARDIOVERSION N/A 07/14/2015  ? Procedure: TRANSESOPHAGEAL ECHOCARDIOGRAM (TEE);  Surgeon: Adrian Prows, MD;  Location: [place];  Service: Cardiovascular;  Laterality: N/A;  ? TUBAL LIGATION    ? TUBAL LIGATION  1980  ? ? ?MEDICATIONS: ? acetaminophen (TYLENOL) 500 MG tablet  ? amLODipine (NORVASC) 10 MG tablet  ? atorvastatin (LIPITOR) 20 MG tablet  ? Azilsartan Medoxomil 40 MG TABS  ? buPROPion (WELLBUTRIN SR) 150 MG 12 hr tablet  ? fexofenadine (ALLEGRA) 180 MG tablet  ? fluticasone (FLONASE) 50 MCG/ACT nasal spray  ? gabapentin (NEURONTIN) 100 MG capsule  ?  hydrochlorothiazide (HYDRODIURIL) 25 MG tablet  ? HYDROcodone-acetaminophen (NORCO) 10-325 MG tablet  ? metFORMIN (GLUCOPHAGE) 500 MG tablet  ? Semaglutide,0.25 or 0.5MG/DOS, (OZEMPIC, 0.25 OR 0.5 MG/DOSE,) 2 MG/1.5ML SOPN  ? Vitamin D, Ergocalciferol, (DRISDOL) 1.25 MG (50000 UNIT) CAPS capsule  ? ?No current facility-administered medications for this encounter.  ? ? ?Myra Gianotti, PA-C ?Surgical Short Stay/Anesthesiology ?[facility] Phone [phone] ?[facility] Phone [phone] ?08/27/2021 5:36 PM ? ? ? ? ? ? ?

## 2021-08-31 ENCOUNTER — Inpatient Hospital Stay (HOSPITAL_COMMUNITY): Payer: Medicare Other

## 2021-08-31 ENCOUNTER — Inpatient Hospital Stay (HOSPITAL_COMMUNITY)
Admission: RE | Admit: 2021-08-31 | Discharge: 2021-09-04 | DRG: 454 | Disposition: A | Discharge status: Home / Self Care | Payer: Medicare Other | Attending: Specialist | Admitting: Specialist

## 2021-08-31 ENCOUNTER — Inpatient Hospital Stay (HOSPITAL_COMMUNITY): Payer: Medicare Other | Admitting: Emergency Medicine

## 2021-08-31 ENCOUNTER — Encounter (HOSPITAL_COMMUNITY): Payer: Self-pay | Admitting: Specialist

## 2021-08-31 ENCOUNTER — Encounter (HOSPITAL_COMMUNITY)
Admission: RE | Disposition: A | Discharge status: Home / Self Care | Payer: Self-pay | Source: Home / Self Care | Attending: Specialist

## 2021-08-31 ENCOUNTER — Other Ambulatory Visit: Payer: Self-pay

## 2021-08-31 ENCOUNTER — Inpatient Hospital Stay (HOSPITAL_COMMUNITY): Payer: Medicare Other | Admitting: Anesthesiology

## 2021-08-31 DIAGNOSIS — E7800 Pure hypercholesterolemia, unspecified: Secondary | ICD-10-CM | POA: Diagnosis present

## 2021-08-31 DIAGNOSIS — M419 Scoliosis, unspecified: Secondary | ICD-10-CM | POA: Diagnosis present

## 2021-08-31 DIAGNOSIS — F411 Generalized anxiety disorder: Secondary | ICD-10-CM | POA: Diagnosis present

## 2021-08-31 DIAGNOSIS — Z79899 Other long term (current) drug therapy: Secondary | ICD-10-CM

## 2021-08-31 DIAGNOSIS — M4326 Fusion of spine, lumbar region: Principal | ICD-10-CM | POA: Diagnosis present

## 2021-08-31 DIAGNOSIS — F32A Depression, unspecified: Secondary | ICD-10-CM | POA: Diagnosis present

## 2021-08-31 DIAGNOSIS — E119 Type 2 diabetes mellitus without complications: Secondary | ICD-10-CM | POA: Diagnosis present

## 2021-08-31 DIAGNOSIS — I10 Essential (primary) hypertension: Secondary | ICD-10-CM | POA: Diagnosis present

## 2021-08-31 DIAGNOSIS — M4135 Thoracogenic scoliosis, thoracolumbar region: Secondary | ICD-10-CM | POA: Diagnosis not present

## 2021-08-31 DIAGNOSIS — M4316 Spondylolisthesis, lumbar region: Secondary | ICD-10-CM | POA: Diagnosis present

## 2021-08-31 DIAGNOSIS — Z90710 Acquired absence of both cervix and uterus: Secondary | ICD-10-CM | POA: Diagnosis not present

## 2021-08-31 DIAGNOSIS — D62 Acute posthemorrhagic anemia: Secondary | ICD-10-CM | POA: Diagnosis not present

## 2021-08-31 DIAGNOSIS — M48062 Spinal stenosis, lumbar region with neurogenic claudication: Principal | ICD-10-CM | POA: Diagnosis present

## 2021-08-31 DIAGNOSIS — Z7984 Long term (current) use of oral hypoglycemic drugs: Secondary | ICD-10-CM

## 2021-08-31 DIAGNOSIS — R42 Dizziness and giddiness: Secondary | ICD-10-CM | POA: Diagnosis not present

## 2021-08-31 DIAGNOSIS — Z833 Family history of diabetes mellitus: Secondary | ICD-10-CM | POA: Diagnosis not present

## 2021-08-31 DIAGNOSIS — M5116 Intervertebral disc disorders with radiculopathy, lumbar region: Secondary | ICD-10-CM | POA: Diagnosis present

## 2021-08-31 DIAGNOSIS — Z8249 Family history of ischemic heart disease and other diseases of the circulatory system: Secondary | ICD-10-CM

## 2021-08-31 DIAGNOSIS — Z9049 Acquired absence of other specified parts of digestive tract: Secondary | ICD-10-CM

## 2021-08-31 DIAGNOSIS — Z87891 Personal history of nicotine dependence: Secondary | ICD-10-CM

## 2021-08-31 DIAGNOSIS — Z880 Allergy status to penicillin: Secondary | ICD-10-CM

## 2021-08-31 DIAGNOSIS — M1990 Unspecified osteoarthritis, unspecified site: Secondary | ICD-10-CM | POA: Diagnosis present

## 2021-08-31 LAB — POCT I-STAT, CHEM 8
BUN: 20 mg/dL (ref 8–23)
Calcium, Ion: 1.18 mmol/L (ref 1.15–1.40)
Chloride: 104 mmol/L (ref 98–111)
Creatinine, Ser: 1.1 mg/dL — ABNORMAL HIGH (ref 0.44–1.00)
Glucose, Bld: 150 mg/dL — ABNORMAL HIGH (ref 70–99)
HCT: 28 % — ABNORMAL LOW (ref 36.0–46.0)
Hemoglobin: 9.5 g/dL — ABNORMAL LOW (ref 12.0–15.0)
Potassium: 3.4 mmol/L — ABNORMAL LOW (ref 3.5–5.1)
Sodium: 138 mmol/L (ref 135–145)
TCO2: 26 mmol/L (ref 22–32)

## 2021-08-31 LAB — GLUCOSE, CAPILLARY
Glucose-Capillary: 134 mg/dL — ABNORMAL HIGH (ref 70–99)
Glucose-Capillary: 161 mg/dL — ABNORMAL HIGH (ref 70–99)
Glucose-Capillary: 233 mg/dL — ABNORMAL HIGH (ref 70–99)
Glucose-Capillary: 206 mg/dL — ABNORMAL HIGH (ref 70–99)

## 2021-08-31 LAB — TYPE AND SCREEN
ABO/RH(D): A POS
Antibody Screen: POSITIVE

## 2021-08-31 SURGERY — POSTERIOR LUMBAR FUSION 2 LEVEL
Anesthesia: General | Site: Spine Lumbar

## 2021-08-31 MED ORDER — ACETAMINOPHEN 325 MG PO TABS: 650.0000 mg | ORAL_TABLET | ORAL | Status: DC | PRN

## 2021-08-31 MED ORDER — PROPOFOL 10 MG/ML IV BOLUS
INTRAVENOUS | Status: DC | PRN
  Administered 2021-08-31: 140 mg via INTRAVENOUS

## 2021-08-31 MED ORDER — BUPROPION HCL ER (SR) 150 MG PO TB12
150.0000 mg | ORAL_TABLET | Freq: Every day | ORAL | Status: DC
  Administered 2021-09-01 – 2021-09-04 (×4): 150 mg via ORAL
  Filled 2021-08-31 (×4): qty 1

## 2021-08-31 MED ORDER — CHLORHEXIDINE GLUCONATE 0.12 % MT SOLN: 15.0000 mL | Freq: Once | OROMUCOSAL | Status: AC

## 2021-08-31 MED ORDER — ACETAMINOPHEN 10 MG/ML IV SOLN
INTRAVENOUS | Status: AC
  Filled 2021-08-31: qty 100

## 2021-08-31 MED ORDER — PHENYLEPHRINE 80 MCG/ML (10ML) SYRINGE FOR IV PUSH (FOR BLOOD PRESSURE SUPPORT)
PREFILLED_SYRINGE | INTRAVENOUS | Status: DC | PRN
  Administered 2021-08-31 (×3): 80 ug via INTRAVENOUS

## 2021-08-31 MED ORDER — MENTHOL 3 MG MT LOZG
1.0000 | LOZENGE | OROMUCOSAL | Status: DC | PRN
  Administered 2021-09-01: 3 mg via ORAL
  Filled 2021-08-31: qty 9

## 2021-08-31 MED ORDER — BUPIVACAINE HCL (PF) 0.5 % IJ SOLN
INTRAMUSCULAR | Status: AC
  Filled 2021-08-31: qty 30

## 2021-08-31 MED ORDER — EPHEDRINE 5 MG/ML INJ
INTRAVENOUS | Status: AC
  Filled 2021-08-31: qty 5

## 2021-08-31 MED ORDER — EPHEDRINE SULFATE-NACL 50-0.9 MG/10ML-% IV SOSY
PREFILLED_SYRINGE | INTRAVENOUS | Status: DC | PRN
  Administered 2021-08-31: 5 mg via INTRAVENOUS

## 2021-08-31 MED ORDER — VANCOMYCIN HCL IN DEXTROSE 1-5 GM/200ML-% IV SOLN
1000.0000 mg | INTRAVENOUS | Status: AC
  Administered 2021-08-31: 1000 mg via INTRAVENOUS
  Filled 2021-08-31: qty 200

## 2021-08-31 MED ORDER — HYDROMORPHONE HCL 1 MG/ML IJ SOLN
0.2500 mg | INTRAMUSCULAR | Status: DC | PRN
  Administered 2021-08-31 (×2): 0.5 mg via INTRAVENOUS

## 2021-08-31 MED ORDER — ARTIFICIAL TEARS OPHTHALMIC OINT
TOPICAL_OINTMENT | OPHTHALMIC | Status: AC
  Filled 2021-08-31: qty 3.5

## 2021-08-31 MED ORDER — LIDOCAINE 2% (20 MG/ML) 5 ML SYRINGE
INTRAMUSCULAR | Status: DC | PRN
  Administered 2021-08-31: 60 mg via INTRAVENOUS

## 2021-08-31 MED ORDER — VANCOMYCIN HCL 1000 MG IV SOLR
INTRAVENOUS | Status: DC | PRN
  Administered 2021-08-31: 1000 mg via INTRAVENOUS

## 2021-08-31 MED ORDER — VANCOMYCIN HCL IN DEXTROSE 1-5 GM/200ML-% IV SOLN
1000.0000 mg | Freq: Once | INTRAVENOUS | Status: AC
Start: 2021-08-31 — End: 2021-08-31
  Administered 2021-08-31: 1000 mg via INTRAVENOUS
  Filled 2021-08-31: qty 200

## 2021-08-31 MED ORDER — PANTOPRAZOLE SODIUM 40 MG IV SOLR
40.0000 mg | Freq: Every day | INTRAVENOUS | Status: DC
  Administered 2021-08-31: 40 mg via INTRAVENOUS
  Filled 2021-08-31: qty 10

## 2021-08-31 MED ORDER — BUPIVACAINE LIPOSOME 1.3 % IJ SUSP
INTRAMUSCULAR | Status: DC | PRN
  Administered 2021-08-31: 19 mL

## 2021-08-31 MED ORDER — ALBUMIN HUMAN 5 % IV SOLN: INTRAVENOUS | Status: DC | PRN

## 2021-08-31 MED ORDER — GABAPENTIN 300 MG PO CAPS: 300.0000 mg | ORAL_CAPSULE | Freq: Two times a day (BID) | ORAL | Status: DC

## 2021-08-31 MED ORDER — TRANEXAMIC ACID-NACL 1000-0.7 MG/100ML-% IV SOLN
1000.0000 mg | INTRAVENOUS | Status: AC
  Administered 2021-08-31: 1000 mg via INTRAVENOUS
  Filled 2021-08-31: qty 100

## 2021-08-31 MED ORDER — ATORVASTATIN CALCIUM 10 MG PO TABS
20.0000 mg | ORAL_TABLET | Freq: Every day | ORAL | Status: DC
  Administered 2021-09-01 – 2021-09-04 (×4): 20 mg via ORAL
  Filled 2021-08-31 (×4): qty 2

## 2021-08-31 MED ORDER — DEXAMETHASONE SODIUM PHOSPHATE 10 MG/ML IJ SOLN
INTRAMUSCULAR | Status: DC | PRN
  Administered 2021-08-31: 10 mg via INTRAVENOUS

## 2021-08-31 MED ORDER — GABAPENTIN 300 MG PO CAPS
300.0000 mg | ORAL_CAPSULE | Freq: Three times a day (TID) | ORAL | Status: DC
  Administered 2021-08-31 – 2021-09-04 (×11): 300 mg via ORAL
  Filled 2021-08-31 (×12): qty 1

## 2021-08-31 MED ORDER — INSULIN ASPART 100 UNIT/ML IJ SOLN
0.0000 [IU] | Freq: Three times a day (TID) | INTRAMUSCULAR | Status: DC
  Administered 2021-08-31: 5 [IU] via SUBCUTANEOUS
  Administered 2021-09-01: 3 [IU] via SUBCUTANEOUS
  Administered 2021-09-01: 2 [IU] via SUBCUTANEOUS
  Administered 2021-09-01 – 2021-09-02 (×2): 3 [IU] via SUBCUTANEOUS
  Administered 2021-09-02 – 2021-09-03 (×4): 2 [IU] via SUBCUTANEOUS
  Administered 2021-09-04: 3 [IU] via SUBCUTANEOUS

## 2021-08-31 MED ORDER — LORATADINE 10 MG PO TABS
10.0000 mg | ORAL_TABLET | Freq: Every day | ORAL | Status: DC
  Administered 2021-09-01 – 2021-09-04 (×4): 10 mg via ORAL
  Filled 2021-08-31 (×4): qty 1

## 2021-08-31 MED ORDER — OXYCODONE HCL ER 10 MG PO T12A
10.0000 mg | EXTENDED_RELEASE_TABLET | Freq: Two times a day (BID) | ORAL | Status: DC
  Administered 2021-08-31 – 2021-09-04 (×7): 10 mg via ORAL
  Filled 2021-08-31 (×8): qty 1

## 2021-08-31 MED ORDER — METHOCARBAMOL 1000 MG/10ML IJ SOLN
500.0000 mg | Freq: Four times a day (QID) | INTRAVENOUS | Status: DC | PRN
  Filled 2021-08-31: qty 5

## 2021-08-31 MED ORDER — SODIUM CHLORIDE 0.9 % IV SOLN: INTRAVENOUS | Status: DC

## 2021-08-31 MED ORDER — SODIUM CHLORIDE 0.9% FLUSH
3.0000 mL | Freq: Two times a day (BID) | INTRAVENOUS | Status: DC
  Administered 2021-09-01 – 2021-09-04 (×7): 3 mL via INTRAVENOUS

## 2021-08-31 MED ORDER — ROCURONIUM BROMIDE 10 MG/ML (PF) SYRINGE
PREFILLED_SYRINGE | INTRAVENOUS | Status: AC
  Filled 2021-08-31: qty 20

## 2021-08-31 MED ORDER — HYDROCHLOROTHIAZIDE 25 MG PO TABS
25.0000 mg | ORAL_TABLET | Freq: Every day | ORAL | Status: DC
  Administered 2021-09-01 – 2021-09-04 (×4): 25 mg via ORAL
  Filled 2021-08-31 (×4): qty 1

## 2021-08-31 MED ORDER — THROMBIN 20000 UNITS EX SOLR
CUTANEOUS | Status: AC
  Filled 2021-08-31: qty 20000

## 2021-08-31 MED ORDER — FLUTICASONE PROPIONATE 50 MCG/ACT NA SUSP
1.0000 | Freq: Every day | NASAL | Status: DC | PRN
  Filled 2021-08-31: qty 16

## 2021-08-31 MED ORDER — METHOCARBAMOL 500 MG PO TABS
500.0000 mg | ORAL_TABLET | Freq: Four times a day (QID) | ORAL | Status: DC | PRN
  Administered 2021-08-31: 500 mg via ORAL
  Filled 2021-08-31 (×2): qty 1

## 2021-08-31 MED ORDER — SURGIFLO WITH THROMBIN (HEMOSTATIC MATRIX KIT) OPTIME
TOPICAL | Status: DC | PRN
  Administered 2021-08-31: 1 via TOPICAL

## 2021-08-31 MED ORDER — IRBESARTAN 300 MG PO TABS
300.0000 mg | ORAL_TABLET | Freq: Every day | ORAL | Status: DC
  Administered 2021-08-31 – 2021-09-04 (×5): 300 mg via ORAL
  Filled 2021-08-31 (×5): qty 1

## 2021-08-31 MED ORDER — ONDANSETRON HCL 4 MG/2ML IJ SOLN
INTRAMUSCULAR | Status: AC
  Filled 2021-08-31: qty 2

## 2021-08-31 MED ORDER — LACTATED RINGERS IV SOLN: INTRAVENOUS | Status: DC | PRN

## 2021-08-31 MED ORDER — MIDAZOLAM HCL 2 MG/2ML IJ SOLN
INTRAMUSCULAR | Status: AC
  Filled 2021-08-31: qty 2

## 2021-08-31 MED ORDER — SODIUM CHLORIDE 0.9% FLUSH: 3.0000 mL | INTRAVENOUS | Status: DC | PRN

## 2021-08-31 MED ORDER — ONDANSETRON HCL 4 MG PO TABS
4.0000 mg | ORAL_TABLET | Freq: Four times a day (QID) | ORAL | Status: DC | PRN
  Administered 2021-08-31: 4 mg via ORAL
  Filled 2021-08-31: qty 1

## 2021-08-31 MED ORDER — LACTATED RINGERS IV SOLN: INTRAVENOUS | Status: DC

## 2021-08-31 MED ORDER — ONDANSETRON HCL 4 MG/2ML IJ SOLN
INTRAMUSCULAR | Status: DC | PRN
  Administered 2021-08-31: 4 mg via INTRAVENOUS

## 2021-08-31 MED ORDER — BUPIVACAINE HCL 0.5 % IJ SOLN
INTRAMUSCULAR | Status: DC | PRN
  Administered 2021-08-31: 19 mL

## 2021-08-31 MED ORDER — ARTIFICIAL TEARS OPHTHALMIC OINT
TOPICAL_OINTMENT | OPHTHALMIC | Status: DC | PRN
  Administered 2021-08-31: 1 via OPHTHALMIC

## 2021-08-31 MED ORDER — MIDAZOLAM HCL 2 MG/2ML IJ SOLN
INTRAMUSCULAR | Status: DC | PRN
  Administered 2021-08-31: 2 mg via INTRAVENOUS

## 2021-08-31 MED ORDER — ACETAMINOPHEN 650 MG RE SUPP: 650.0000 mg | RECTAL | Status: DC | PRN

## 2021-08-31 MED ORDER — SODIUM CHLORIDE 0.9 % IV SOLN: 250.0000 mL | INTRAVENOUS | Status: DC

## 2021-08-31 MED ORDER — BISACODYL 5 MG PO TBEC: 5.0000 mg | DELAYED_RELEASE_TABLET | Freq: Every day | ORAL | Status: DC | PRN

## 2021-08-31 MED ORDER — ACETAMINOPHEN 500 MG PO TABS
1000.0000 mg | ORAL_TABLET | Freq: Once | ORAL | Status: AC
  Administered 2021-08-31: 1000 mg via ORAL
  Filled 2021-08-31: qty 2

## 2021-08-31 MED ORDER — OXYCODONE HCL 5 MG PO TABS: 5.0000 mg | ORAL_TABLET | ORAL | Status: DC | PRN

## 2021-08-31 MED ORDER — ALUM & MAG HYDROXIDE-SIMETH 200-200-20 MG/5ML PO SUSP: 30.0000 mL | Freq: Four times a day (QID) | ORAL | Status: DC | PRN

## 2021-08-31 MED ORDER — PHENYLEPHRINE 80 MCG/ML (10ML) SYRINGE FOR IV PUSH (FOR BLOOD PRESSURE SUPPORT)
PREFILLED_SYRINGE | INTRAVENOUS | Status: AC
  Filled 2021-08-31: qty 10

## 2021-08-31 MED ORDER — ACETAMINOPHEN 10 MG/ML IV SOLN
INTRAVENOUS | Status: DC | PRN
  Administered 2021-08-31: 1000 mg via INTRAVENOUS

## 2021-08-31 MED ORDER — PHENYLEPHRINE HCL-NACL 20-0.9 MG/250ML-% IV SOLN
INTRAVENOUS | Status: DC | PRN
  Administered 2021-08-31: 25 ug/min via INTRAVENOUS

## 2021-08-31 MED ORDER — FENTANYL CITRATE (PF) 250 MCG/5ML IJ SOLN
INTRAMUSCULAR | Status: DC | PRN
  Administered 2021-08-31 (×3): 50 ug via INTRAVENOUS

## 2021-08-31 MED ORDER — POLYETHYLENE GLYCOL 3350 17 G PO PACK: 17.0000 g | PACK | Freq: Every day | ORAL | Status: DC | PRN

## 2021-08-31 MED ORDER — DEXAMETHASONE SODIUM PHOSPHATE 10 MG/ML IJ SOLN
INTRAMUSCULAR | Status: AC
  Filled 2021-08-31: qty 1

## 2021-08-31 MED ORDER — CHLORHEXIDINE GLUCONATE 0.12 % MT SOLN
OROMUCOSAL | Status: AC
  Administered 2021-08-31: 15 mL via OROMUCOSAL
  Filled 2021-08-31: qty 15

## 2021-08-31 MED ORDER — DOCUSATE SODIUM 100 MG PO CAPS
100.0000 mg | ORAL_CAPSULE | Freq: Two times a day (BID) | ORAL | Status: DC
  Administered 2021-08-31 – 2021-09-04 (×8): 100 mg via ORAL
  Filled 2021-08-31 (×8): qty 1

## 2021-08-31 MED ORDER — ONDANSETRON HCL 4 MG/2ML IJ SOLN: 4.0000 mg | Freq: Four times a day (QID) | INTRAMUSCULAR | Status: DC | PRN

## 2021-08-31 MED ORDER — SEMAGLUTIDE(0.25 OR 0.5MG/DOS) 2 MG/1.5ML ~~LOC~~ SOPN: 0.5000 mg | PEN_INJECTOR | SUBCUTANEOUS | Status: DC

## 2021-08-31 MED ORDER — GELATIN ABSORBABLE 100 EX MISC
CUTANEOUS | Status: DC | PRN
Start: 2021-08-31 — End: 2021-08-31

## 2021-08-31 MED ORDER — INSULIN ASPART 100 UNIT/ML IJ SOLN: 0.0000 [IU] | INTRAMUSCULAR | Status: DC | PRN

## 2021-08-31 MED ORDER — HYDROCODONE-ACETAMINOPHEN 7.5-325 MG PO TABS
2.0000 | ORAL_TABLET | ORAL | Status: DC | PRN
  Administered 2021-08-31 – 2021-09-02 (×2): 2 via ORAL
  Filled 2021-08-31 (×5): qty 2

## 2021-08-31 MED ORDER — 0.9 % SODIUM CHLORIDE (POUR BTL) OPTIME
TOPICAL | Status: DC | PRN
  Administered 2021-08-31 (×3): 1000 mL

## 2021-08-31 MED ORDER — BUPIVACAINE LIPOSOME 1.3 % IJ SUSP: 10.0000 mL | Freq: Once | INTRAMUSCULAR | Status: DC

## 2021-08-31 MED ORDER — ORAL CARE MOUTH RINSE: 15.0000 mL | Freq: Once | OROMUCOSAL | Status: AC

## 2021-08-31 MED ORDER — CELECOXIB 200 MG PO CAPS
200.0000 mg | ORAL_CAPSULE | Freq: Two times a day (BID) | ORAL | Status: DC
  Administered 2021-08-31 – 2021-09-04 (×8): 200 mg via ORAL
  Filled 2021-08-31 (×8): qty 1

## 2021-08-31 MED ORDER — FLEET ENEMA 7-19 GM/118ML RE ENEM: 1.0000 | ENEMA | Freq: Once | RECTAL | Status: DC | PRN

## 2021-08-31 MED ORDER — FENTANYL CITRATE (PF) 250 MCG/5ML IJ SOLN
INTRAMUSCULAR | Status: AC
  Filled 2021-08-31: qty 5

## 2021-08-31 MED ORDER — VITAMIN D (ERGOCALCIFEROL) 1.25 MG (50000 UNIT) PO CAPS
50000.0000 [IU] | ORAL_CAPSULE | ORAL | Status: DC
  Administered 2021-09-01: 50000 [IU] via ORAL
  Filled 2021-08-31: qty 1

## 2021-08-31 MED ORDER — BUPIVACAINE LIPOSOME 1.3 % IJ SUSP
INTRAMUSCULAR | Status: AC
  Filled 2021-08-31: qty 20

## 2021-08-31 MED ORDER — PROPOFOL 10 MG/ML IV BOLUS
INTRAVENOUS | Status: AC
  Filled 2021-08-31: qty 20

## 2021-08-31 MED ORDER — SUGAMMADEX SODIUM 200 MG/2ML IV SOLN
INTRAVENOUS | Status: DC | PRN
  Administered 2021-08-31: 200 mg via INTRAVENOUS

## 2021-08-31 MED ORDER — HYDROMORPHONE HCL 1 MG/ML IJ SOLN
INTRAMUSCULAR | Status: AC
  Filled 2021-08-31: qty 1

## 2021-08-31 MED ORDER — GELATIN ABSORBABLE 100 EX MISC
CUTANEOUS | Status: DC | PRN
  Administered 2021-08-31: 5 mL via TOPICAL

## 2021-08-31 MED ORDER — AMLODIPINE BESYLATE 10 MG PO TABS
10.0000 mg | ORAL_TABLET | Freq: Every day | ORAL | Status: DC
  Administered 2021-09-01 – 2021-09-02 (×2): 10 mg via ORAL
  Filled 2021-08-31 (×2): qty 1

## 2021-08-31 MED ORDER — LIDOCAINE 2% (20 MG/ML) 5 ML SYRINGE
INTRAMUSCULAR | Status: AC
  Filled 2021-08-31: qty 5

## 2021-08-31 MED ORDER — ROCURONIUM BROMIDE 10 MG/ML (PF) SYRINGE
PREFILLED_SYRINGE | INTRAVENOUS | Status: DC | PRN
  Administered 2021-08-31: 40 mg via INTRAVENOUS
  Administered 2021-08-31: 80 mg via INTRAVENOUS
  Administered 2021-08-31 (×2): 40 mg via INTRAVENOUS

## 2021-08-31 MED ORDER — ACETAMINOPHEN 500 MG PO TABS: 1000.0000 mg | ORAL_TABLET | Freq: Three times a day (TID) | ORAL | Status: DC | PRN

## 2021-08-31 MED ORDER — PHENOL 1.4 % MT LIQD: 1.0000 | OROMUCOSAL | Status: DC | PRN

## 2021-08-31 SURGICAL SUPPLY — 83 items
BAG COUNTER SPONGE SURGICOUNT (BAG) ×3 IMPLANT
BLADE CLIPPER SURG (BLADE) IMPLANT
BUR MATCHSTICK NEURO 3.0 LAGG (BURR) ×2 IMPLANT
BUR RND FLUTED 2.5 (BURR) IMPLANT
BUR SABER RD CUTTING 3.0 (BURR) IMPLANT
CAGE SABLE 10X26 6-12 8D (Cage) ×1 IMPLANT
CAGE SABLE 10X26 8D (Cage) ×1 IMPLANT
CANNULA GRAFT BNE VG PRE-FILL (Bone Implant) IMPLANT
CAP LOCKING THREADED (Cap) ×6 IMPLANT
COVER BACK TABLE 80X110 HD (DRAPES) ×2 IMPLANT
COVER MAYO STAND STRL (DRAPES) ×3 IMPLANT
COVER SURGICAL LIGHT HANDLE (MISCELLANEOUS) ×2 IMPLANT
DERMABOND ADVANCED (GAUZE/BANDAGES/DRESSINGS) ×1
DERMABOND ADVANCED .7 DNX12 (GAUZE/BANDAGES/DRESSINGS) ×1 IMPLANT
DISPENSER GRAFT BNE VG (MISCELLANEOUS) IMPLANT
DISPENSER VIVIGEN BONE GRAFT (MISCELLANEOUS) ×2 IMPLANT
DRAPE C-ARM 42X72 X-RAY (DRAPES) ×3 IMPLANT
DRAPE C-ARMOR (DRAPES) ×2 IMPLANT
DRAPE MICROSCOPE LEICA (MISCELLANEOUS) ×2 IMPLANT
DRAPE SURG 17X23 STRL (DRAPES) ×8 IMPLANT
DRIVER SHAFT QC SHORT 1/4 (ORTHOPEDIC DISPOSABLE SUPPLIES) ×1 IMPLANT
DRSG MEPILEX BORDER 4X4 (GAUZE/BANDAGES/DRESSINGS) IMPLANT
DRSG MEPILEX BORDER 4X8 (GAUZE/BANDAGES/DRESSINGS) ×1 IMPLANT
DURAPREP 26ML APPLICATOR (WOUND CARE) ×2 IMPLANT
ELECT BLADE 6.5 EXT (BLADE) IMPLANT
ELECT CAUTERY BLADE 6.4 (BLADE) ×2 IMPLANT
ELECT REM PT RETURN 9FT ADLT (ELECTROSURGICAL) ×2
ELECTRODE REM PT RTRN 9FT ADLT (ELECTROSURGICAL) ×1 IMPLANT
EVACUATOR 1/8 PVC DRAIN (DRAIN) IMPLANT
GLOVE BIOGEL PI IND STRL 8 (GLOVE) ×1 IMPLANT
GLOVE BIOGEL PI INDICATOR 8 (GLOVE) ×1
GLOVE ECLIPSE 7.0 STRL STRAW (GLOVE) ×1 IMPLANT
GLOVE ECLIPSE 9.0 STRL (GLOVE) ×2 IMPLANT
GLOVE INDICATOR 7.5 STRL GRN (GLOVE) ×1 IMPLANT
GLOVE ORTHO TXT STRL SZ7.5 (GLOVE) ×2 IMPLANT
GLOVE SURG 8.5 LATEX PF (GLOVE) ×2 IMPLANT
GOWN STRL REUS W/ TWL LRG LVL3 (GOWN DISPOSABLE) ×1 IMPLANT
GOWN STRL REUS W/TWL 2XL LVL3 (GOWN DISPOSABLE) ×4 IMPLANT
GOWN STRL REUS W/TWL LRG LVL3 (GOWN DISPOSABLE) ×2
GRAFT BONE CANNULA VIVIGEN 3 (Bone Implant) ×12 IMPLANT
KIT BASIN OR (CUSTOM PROCEDURE TRAY) ×2 IMPLANT
KIT POSITION SURG JACKSON T1 (MISCELLANEOUS) ×2 IMPLANT
KIT TURNOVER KIT B (KITS) ×2 IMPLANT
MANIFOLD NEPTUNE II (INSTRUMENTS) ×2 IMPLANT
NDL SPNL 18GX3.5 QUINCKE PK (NEEDLE) ×1 IMPLANT
NEEDLE 22X1 1/2 (OR ONLY) (NEEDLE) ×2 IMPLANT
NEEDLE SPNL 18GX3.5 QUINCKE PK (NEEDLE) ×2 IMPLANT
NS IRRIG 1000ML POUR BTL (IV SOLUTION) ×3 IMPLANT
PACK LAMINECTOMY ORTHO (CUSTOM PROCEDURE TRAY) ×2 IMPLANT
PAD ARMBOARD 7.5X6 YLW CONV (MISCELLANEOUS) ×4 IMPLANT
PATTIES SURGICAL .5 X.5 (GAUZE/BANDAGES/DRESSINGS) ×1 IMPLANT
PATTIES SURGICAL .75X.75 (GAUZE/BANDAGES/DRESSINGS) IMPLANT
PATTIES SURGICAL 1X1 (DISPOSABLE) ×2 IMPLANT
PENCIL BUTTON HOLSTER BLD 10FT (ELECTRODE) ×1 IMPLANT
ROD 70MM SPINAL (Rod) ×2 IMPLANT
ROD 75MM SPINAL (Rod) ×1 IMPLANT
ROD CREO MOD 6.5X50 (Rod) ×2 IMPLANT
SCREW CREO ONE ROB MOD 7.5X55 (Screw) ×2 IMPLANT
SCREW MOD SD CREO 7.5X50 (Screw) ×2 IMPLANT
SCREW PA THRD CREO TULIP 5.5X4 (Head) ×7 IMPLANT
SPONGE SURGIFOAM ABS GEL 100 (HEMOSTASIS) ×2 IMPLANT
SPONGE T-LAP 4X18 ~~LOC~~+RFID (SPONGE) ×2 IMPLANT
STAPLER VISISTAT 35W (STAPLE) ×1 IMPLANT
SURGIFLO W/THROMBIN 8M KIT (HEMOSTASIS) IMPLANT
SUT VIC AB 0 CT1 27 (SUTURE) ×2
SUT VIC AB 0 CT1 27XBRD ANBCTR (SUTURE) ×1 IMPLANT
SUT VIC AB 1 CT1 36 (SUTURE) ×1 IMPLANT
SUT VIC AB 1 CTX 36 (SUTURE)
SUT VIC AB 1 CTX36XBRD ANBCTR (SUTURE) ×2 IMPLANT
SUT VIC AB 2-0 CT1 27 (SUTURE) ×4
SUT VIC AB 2-0 CT1 TAPERPNT 27 (SUTURE) ×1 IMPLANT
SUT VIC AB 3-0 X1 27 (SUTURE) ×2 IMPLANT
SYR 20ML LL LF (SYRINGE) ×2 IMPLANT
SYR CONTROL 10ML LL (SYRINGE) ×4 IMPLANT
TAP SURG AMP CREO 4.5 (TAP) ×1 IMPLANT
TAP SURG AMP CREO 5.5 (TAP) ×1 IMPLANT
TAP SURG AMP CREO 6.5 (TAP) ×1 IMPLANT
TAP SURG BEACON GLBU 7.5 (TAP) ×1 IMPLANT
TOWEL GREEN STERILE (TOWEL DISPOSABLE) ×2 IMPLANT
TOWEL GREEN STERILE FF (TOWEL DISPOSABLE) ×2 IMPLANT
TRAY FOLEY MTR SLVR 16FR STAT (SET/KITS/TRAYS/PACK) ×2 IMPLANT
WATER STERILE IRR 1000ML POUR (IV SOLUTION) ×2 IMPLANT
YANKAUER SUCT BULB TIP NO VENT (SUCTIONS) ×2 IMPLANT

## 2021-08-31 NOTE — Brief Op Note (Signed)
08/31/2021 ? ?2:21 PM ? ?PATIENT:  Carol Wilkins  66 y.o. female ? 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 ? ?POST-OPERATIVE DIAGNOSIS:  lumbar spondylolisthesis lumbar three -lumbar four and Lumbar four- lumbar five with spinal stenosis and neurogenic claudication, thoracolumbar scoliosis. ? ?PROCEDURE:  Procedure(s): ?LEFT Lumbar three - lumbar four AND Lumbar four- lumbar five TRANSFORAMINAL LUMBAR INTERBODY FUSION WITH RODS, SCREWS AND CAGES, LOCAL BONE GRAFT, ALLOGRAFT BONE GRAFT AND VIVIGEN (N/A) ? ?SURGEON:  Surgeon(s) and Role: ?   * Jessy Oto, MD - Primary ? ?PHYSICIAN ASSISTANT: Benjiman Core, PA-C ? ?ANESTHESIA:   local and general ? ?EBL:  250 mL  ? ?BLOOD ADMINISTERED: 150 CC CELLSAVER ? ?DRAINS: Urinary Catheter (Foley)  ? ?LOCAL MEDICATIONS USED:  MARCAINE 0.5% 1:1 EXPAREL 1.3%  Amount: 38 ml ? ?SPECIMEN:  No Specimen ? ?DISPOSITION OF SPECIMEN:  N/A ? ?COUNTS:  YES ? ?TOURNIQUET:  * No tourniquets in log * ? ?DICTATION: .Dragon Dictation ? ?PLAN OF CARE: Admit to inpatient  ? ?PATIENT DISPOSITION:  PACU - hemodynamically stable. ?  ?Delay start of Pharmacological VTE agent (>24hrs) due to surgical blood loss or risk of bleeding: yes ? ?

## 2021-08-31 NOTE — Interval H&P Note (Signed)
History and Physical Interval Note: ? ?08/31/2021 ?7:42 AM ? 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  The various methods of treatment have been discussed with the patient and family. After consideration of risks, benefits and other options for treatment, the patient has consented to  Procedure(s): ?LEFT L3-4 AND L4-5 TRANSFORAMINAL LUMBAR INTERBODY FUSION WITH RODS, SCREWS AND CAGES, LOCAL BONE GRAFT, ALLOGRAFT BONE GRAFT AND VIVIGEN (N/A) as a surgical intervention.  The patient's history has been reviewed, patient examined, no change in status, stable for surgery.  I have reviewed the patient's chart and labs.  Questions were answered to the patient's satisfaction.   ? ? ?[name] ? ? ?

## 2021-08-31 NOTE — Discharge Instructions (Addendum)
    Call if there is increasing drainage, fever greater than 101.5, severe head aches, and worsening nausea or light sensitivity. If shortness of breath, bloody cough or chest tightness or pain go to an emergency room. No lifting greater than 10 lbs. Avoid bending, stooping and twisting. Use brace when sitting and out of bed even to go to bathroom. Walk in house for first 2 weeks then may start to get out slowly increasing distances up to one quarter mile by 4-6 weeks post op. After 5 days may shower and change dressing following bathing with shower.When bathing remove the brace shower and replace brace before getting out of the shower. If drainage, keep dry dressing and do not bathe the incision, use an moisture impervious dressing. Please call and return for scheduled follow up appointment 2 weeks from the time of surgery.  Take 5 mg of norvasc daily instead of the usual 10 mg daily due to anemia. Make an appointment to see your primary care MD for follow up of hypertension as it is not high now and your meds may need adjustment.

## 2021-08-31 NOTE — Progress Notes (Signed)
Orthopedic Tech Progress Note ?Patient Details:  ?Carol Wilkins ?[birth year] ?[id number] ? ?Ortho Devices ?Type of Ortho Device: Lumbar corsett ?Ortho Device/Splint Location: BACK ?Ortho Device/Splint Interventions: Ordered ?  ?Post Interventions ?Patient Tolerated: Well ?Instructions Provided: Care of device ? ?Janit Pagan ?08/31/2021, 4:22 PM ? ?

## 2021-08-31 NOTE — Anesthesia Postprocedure Evaluation (Signed)
Anesthesia Post Note ? ?Patient: Carol Wilkins ? ?Procedure(s) Performed: LEFT Lumbar three - lumbar four AND Lumbar four- lumbar five TRANSFORAMINAL LUMBAR INTERBODY FUSION WITH RODS, SCREWS AND CAGES, LOCAL BONE GRAFT, ALLOGRAFT BONE GRAFT AND VIVIGEN (Spine Lumbar) ? ?  ? ?Patient location during evaluation: PACU ?Anesthesia Type: General ?Level of consciousness: awake and alert ?Pain management: pain level controlled ?Vital Signs Assessment: post-procedure vital signs reviewed and stable ?Respiratory status: spontaneous breathing, nonlabored ventilation and respiratory function stable ?Cardiovascular status: blood pressure returned to baseline and stable ?Postop Assessment: no apparent nausea or vomiting ?Anesthetic complications: no ? ? ?No notable events documented. ? ?Last Vitals:  ?Vitals:  ? 08/31/21 1440 08/31/21 1455  ?BP: 118/61 124/64  ?Pulse: 95 92  ?Resp: 18 15  ?Temp:    ?SpO2: 96% 95%  ?  ?Last Pain:  ?Vitals:  ? 08/31/21 1440  ?TempSrc:   ?PainSc: 7   ? ? ?  ?  ?  ?  ?  ?  ? ?Carol Wilkins,W. EDMOND ? ? ? ? ?

## 2021-08-31 NOTE — Transfer of Care (Signed)
Immediate Anesthesia Transfer of Care Note ? ?Patient: Carol Wilkins ? ?Procedure(s) Performed: LEFT Lumbar three - lumbar four AND Lumbar four- lumbar five TRANSFORAMINAL LUMBAR INTERBODY FUSION WITH RODS, SCREWS AND CAGES, LOCAL BONE GRAFT, ALLOGRAFT BONE GRAFT AND VIVIGEN (Spine Lumbar) ? ?Patient Location: PACU ? ?Anesthesia Type:General ? ?Level of Consciousness: drowsy ? ?Airway & Oxygen Therapy: Patient Spontanous Breathing and Patient connected to face mask oxygen ? ?Post-op Assessment: Report given to RN and Post -op Vital signs reviewed and stable ? ?Post vital signs: Reviewed and stable ? ?Last Vitals:  ?Vitals Value Taken Time  ?BP 131/66 08/31/21 1410  ?Temp    ?Pulse 100 08/31/21 1412  ?Resp 18 08/31/21 1412  ?SpO2 100 % 08/31/21 1412  ?Vitals shown include unvalidated device data. ? ?Last Pain:  ?Vitals:  ? 08/31/21 0647  ?TempSrc:   ?PainSc: 0-No pain  ?   ? ?  ? ?Complications: No notable events documented. ?

## 2021-08-31 NOTE — Anesthesia Procedure Notes (Signed)
Arterial Line Insertion ?Start/End5/16/2023 7:15 AM, 08/31/2021 7:20 AM ?Performed by: Janace Litten, CRNA, CRNA ? Patient location: Pre-op. ?Preanesthetic checklist: patient identified, IV checked, site marked, risks and benefits discussed, surgical consent, monitors and equipment checked, pre-op evaluation, timeout performed and anesthesia consent ?Lidocaine 1% used for infiltration ?Left, radial was placed ?Catheter size: 20 G ?Hand hygiene performed  and maximum sterile barriers used  ? ?Attempts: 2 ?Procedure performed without using ultrasound guided technique. ?Following insertion, dressing applied and Biopatch. ?Post procedure assessment: normal and unchanged ? ?Patient tolerated the procedure well with no immediate complications. ? ? ?

## 2021-08-31 NOTE — H&P (Signed)
Carol Wilkins is an 66 y.o. female.   ?Chief Complaint: back pain and LE radiculopathy ? ?HPI: 66 year old black female with history of L3-4 and L4-5 HNP/stenosis comes in for preop evaluation.  States that symptoms unchanged from previous visit and she is wanting to proceed with LEFT L3-4 AND L4-5 TRANSFORAMINAL LUMBAR INTERBODY FUSION WITH RODS, SCREWS AND CAGES, LOCAL BONE GRAFT, ALLOGRAFT BONE GRAFT AND VIVIGEN as scheduled.  Today history physical performed.  Review of systems negative.  Patient has a cardiologist.  We are awaiting cardiac clearance. ? ?Past Medical History:  ?Diagnosis Date  ? Anxiety attack   ? Arthritis   ? Diabetes mellitus without complication ([place])   ? type 2 on metformin  ? High cholesterol   ? Hypertension   ? ? ?Past Surgical History:  ?Procedure Laterality Date  ? ABDOMINAL HYSTERECTOMY    ? in early 59's  ? CHOLECYSTECTOMY    ? in the 90's  ? TEE WITHOUT CARDIOVERSION N/A 07/14/2015  ? Procedure: TRANSESOPHAGEAL ECHOCARDIOGRAM (TEE);  Surgeon: Adrian Prows, MD;  Location: [place];  Service: Cardiovascular;  Laterality: N/A;  ? TUBAL LIGATION    ? TUBAL LIGATION  1980  ? ? ?Family History  ?Problem Relation Age of Onset  ? Heart disease Mother   ? Diabetes Mother   ? Hypertension Mother   ? Diabetes Father   ? Hypertension Father   ? Diabetes Sister   ? Hypertension Sister   ? Heart disease Sister   ? Diabetes Brother   ? Hypertension Brother   ? Heart disease Brother   ? ?Social History:  reports that she quit smoking about 40 years ago. Her smoking use included cigarettes. She has never used smokeless tobacco. She reports that she does not drink alcohol and does not use drugs. ? ?Allergies:  ?Allergies  ?Allergen Reactions  ? Penicillins Hives  ?  Has patient had a PCN reaction causing immediate rash, facial/tongue/throat swelling, SOB or lightheadedness with hypotension: Yes ?Has patient had a PCN reaction causing severe rash involving mucus membranes or skin necrosis:  No ?Has patient had a PCN reaction that required hospitalization No ?Has patient had a PCN reaction occurring within the last 10 years: No ?If all of the above answers are "NO", then may proceed with Cephalosporin use. ?  ? ? ?Medications Prior to Admission  ?Medication Sig Dispense Refill  ? acetaminophen (TYLENOL) 500 MG tablet Take 2 tablets (1,000 mg total) by mouth every 6 (six) hours as needed. 30 tablet 0  ? amLODipine (NORVASC) 10 MG tablet Take 10 mg by mouth daily.  0  ? atorvastatin (LIPITOR) 20 MG tablet Take 1 tablet by mouth daily.    ? Azilsartan Medoxomil 40 MG TABS Take 40 mg by mouth daily.    ? buPROPion (WELLBUTRIN SR) 150 MG 12 hr tablet Take 150 mg by mouth daily.    ? fexofenadine (ALLEGRA) 180 MG tablet Take 180 mg by mouth daily as needed for allergies or rhinitis.    ? fluticasone (FLONASE) 50 MCG/ACT nasal spray Place 1 spray into both nostrils daily as needed for allergies or rhinitis.    ? hydrochlorothiazide (HYDRODIURIL) 25 MG tablet Take 25 mg by mouth daily.   0  ? HYDROcodone-acetaminophen (NORCO) 10-325 MG tablet Take 0.5 tablets by mouth every 6 (six) hours as needed for moderate pain. 30 tablet 0  ? metFORMIN (GLUCOPHAGE) 500 MG tablet Take 500 mg by mouth daily.    ? Semaglutide,0.25 or 0.'5MG'$ /DOS, ([place],  0.25 OR 0.5 MG/DOSE,) 2 MG/1.5ML SOPN Inject 0.5 mg into the skin every Thursday.    ? Vitamin D, Ergocalciferol, (DRISDOL) 1.25 MG (50000 UNIT) CAPS capsule Take 50,000 Units by mouth every Wednesday.    ? gabapentin (NEURONTIN) 100 MG capsule TAKE 1 CAPSULE(100 MG) BY MOUTH AT BEDTIME (Patient not taking: Reported on 08/20/2021) 30 capsule 1  ? ? ?No results found for this or any previous visit (from the past 48 hour(s)). ?No results found. ? ?Review of Systems  ?Constitutional:  Positive for activity change.  ?HENT: Negative.    ?Respiratory: Negative.    ?Cardiovascular: Negative.   ?Genitourinary: Negative.   ?Musculoskeletal:  Positive for back pain and gait problem.   ?Neurological:  Positive for numbness.  ?Psychiatric/Behavioral: Negative.    ? ?Blood pressure 132/66, pulse 85, temperature 98.1 ?F (36.7 ?C), temperature source Oral, resp. rate 17, SpO2 98 %. ?Physical Exam ?HENT:  ?   Head: Normocephalic and atraumatic.  ?Cardiovascular:  ?   Rate and Rhythm: Normal rate and regular rhythm.  ?Pulmonary:  ?   Effort: Pulmonary effort is normal. No respiratory distress.  ?   Breath sounds: Normal breath sounds.  ?Abdominal:  ?   General: Bowel sounds are normal.  ?   Tenderness: There is no abdominal tenderness.  ?Neurological:  ?   Mental Status: She is alert and oriented to person, place, and time.  ?Psychiatric:     ?   Mood and Affect: Mood normal.  ?  ? ?Assessment/Plan ?L3-4, L4-5 HNP/stenosis ? ?Will proceed with LEFT L3-4 AND L4-5 TRANSFORAMINAL LUMBAR INTERBODY FUSION WITH RODS, SCREWS AND CAGES, LOCAL BONE GRAFT, ALLOGRAFT BONE GRAFT AND VIVIGEN as scheduled.  Surgical procedure discussed and all questions answered.  ? ?Benjiman Core, PA-C ?08/31/2021, 6:19 AM ? ? ? ?

## 2021-08-31 NOTE — Anesthesia Procedure Notes (Signed)
Procedure Name: Intubation ?Date/Time: 08/31/2021 8:03 AM ?Performed by: Bryson Corona, CRNA ?Pre-anesthesia Checklist: Patient identified, Emergency Drugs available, Suction available and Patient being monitored ?Patient Re-evaluated:Patient Re-evaluated prior to induction ?Oxygen Delivery Method: Circle System Utilized ?Preoxygenation: Pre-oxygenation with 100% oxygen ?Induction Type: IV induction ?Ventilation: Mask ventilation without difficulty ?Laryngoscope Size: Mac and 3 ?Grade View: Grade II ?Tube type: Oral ?Number of attempts: 1 ?Airway Equipment and Method: Stylet and Oral airway ?Placement Confirmation: ETT inserted through vocal cords under direct vision, positive ETCO2 and breath sounds checked- equal and bilateral ?Secured at: 22 cm ?Tube secured with: Tape ?Dental Injury: Teeth and Oropharynx as per pre-operative assessment  ?Comments: Prominent front teeth. ? ? ? ? ?

## 2021-08-31 NOTE — Op Note (Signed)
08/31/2021  2:25 PM  PATIENT:  Carol Wilkins  66 y.o. female  MRN: [id number]  OPERATIVE REPORT  PRE-OPERATIVE DIAGNOSIS:  lumbar spondylolisthesis L3-4 and L4-5 with spinal stenosis and neurogenic claudication, thoracolumbar scoliosis.  POST-OPERATIVE DIAGNOSIS:  lumbar spondylolisthesis lumbar three -lumbar four and Lumbar four- lumbar five with spinal stenosis and neurogenic claudication, thoracolumbar scoliosis.  PROCEDURE:  Procedure(s): LEFT Lumbar three - lumbar four AND Lumbar four- lumbar five TRANSFORAMINAL LUMBAR INTERBODY FUSION WITH RODS, SCREWS AND CAGES, LOCAL BONE GRAFT, ALLOGRAFT BONE GRAFT AND VIVIGEN    SURGEON:  Jessy Oto, MD     ASSISTANT:  Benjiman Core, PA-C  (Present throughout the entire procedure and necessary for completion of procedure in a timely manner)     ANESTHESIA:  General, supplemented with local marcaine 0.5% 1:1 exparel 1.3% total 38cc.     COMPLICATIONS:  None.   EBL:300  CELL SAVER BLOOD RETURNED: 150CC  DRAINS: Foley to SD.  COMPONENTS:  Implant Name Type Inv. Item Serial No. Manufacturer Lot No. LRB No. Used Action  GRAFT BONE CANNULA VIVIGEN 3 - [id number] Bone Implant GRAFT BONE CANNULA VIVIGEN 3 [id number] [employer]  N/A 1 Implanted  GRAFT BONE CANNULA VIVIGEN 3 - [id number] Bone Implant GRAFT BONE CANNULA VIVIGEN 3 [id number] [employer]  N/A 1 Implanted  GRAFT BONE CANNULA VIVIGEN 3 - [id number] Bone Implant GRAFT BONE CANNULA VIVIGEN 3 [id number] [employer]  N/A 1 Implanted  GRAFT BONE CANNULA VIVIGEN 3 - [id number] Bone Implant GRAFT BONE CANNULA VIVIGEN 3 [id number] [employer]  N/A 1 Implanted  GRAFT BONE CANNULA VIVIGEN 3 - [id number] Bone Implant GRAFT BONE CANNULA VIVIGEN 3 [id number] [employer]  N/A 1 Implanted  GRAFT BONE CANNULA VIVIGEN 3 - [id number] Bone Implant GRAFT BONE CANNULA VIVIGEN 3 [id number] [employer]  N/A 1 Implanted  CAGE SABLE 10X26 8D - [id number]  Cage CAGE SABLE 10X26 8D  [employer] [id number] N/A 1 Implanted  CAGE SABLE 10X26 6-12 8D - [id number] Cage CAGE SABLE 10X26 6-12 8D  [employer] [id number] N/A 1 Implanted  ROD CREO MOD 6.5X50 - [id number] Rod ROD CREO MOD 6.5X50  [employer]  N/A 2 Implanted  SCREW MOD SD CREO 7.5X50 - [id number] Screw SCREW MOD SD CREO 7.5X50  [employer]  N/A 2 Implanted  SCREW CREO ONE ROB MOD 7.5X55 - [id number] Screw SCREW CREO ONE ROB MOD 7.5X55  [employer]  N/A 2 Implanted  SCREW PA THRD CREO TULIP 5.5X4 - [id number] Head SCREW PA THRD CREO TULIP 5.5X4  [employer]  N/A 5 Implanted  CAP LOCKING THREADED - [id number] Cap CAP LOCKING THREADED  [employer]  N/A 6 Implanted  CAGE SABLE 10X26 8D - [id number] Cage CAGE SABLE 10X26 8D  [employer]  N/A 1 Implanted  CAGE SABLE 10X26 6-12 8D - [id number] Cage CAGE SABLE 10X26 6-12 8D  [employer]  N/A 1 Implanted  SCREW PA THRD CREO TULIP 5.5X4 - [id number] Head SCREW PA THRD CREO TULIP 5.5X4  [employer]  N/A 1 Implanted and Explanted  ROD 75MM SPINAL - [id number] Rod ROD 75MM SPINAL  [employer]  N/A 1 Implanted  ROD 70MM SPINAL - [id number] Rod ROD 70MM SPINAL  [employer]  N/A 2 Implanted     PROCEDURE:    The patient was met in the holding area, and the appropriate left L3-4 and L4-5 lumbar levels identified and marked with an "X" and my initials. I had discussion with the patient in the preop holding  area regarding consent form. Patient understands the rationale for the fusion site as the L3-4 and L4-5 segment For stenosis and degenerative spondylolisthesis at both segments.  The patient was then transported to OR and was placed under general anestheticwithout difficulty. The patient received appropriate preoperative antibiotic prophylaxis vancomycin penicillin allergy. . Nursing staff inserted a Foley catheter under sterile conditions. The patient was then turned to a prone position using the Lantana spine frame. PAS. all pressure  points well padded the arms at the side to 90 90. Standard prep with DuraPrep solution draped in the usual manner from the lower dorsal spine the mid sacral segment. Iodine Vi-Drape was used and the incision was marked. Time-out procedure was called and correct. Skin in the midline between L2 and L5 was then infiltrated with Marcaine half percent 1:1 exparel 1.3% total of 20 cc used. Incision was then made through the skin and subcutaneous layers down to the patient's lumbodorsal fascia and spinous processes. The incision then carried sharply excising the supraspinous ligament and then continuing the lateral aspect of the spinous processes L3 L4-L5. Cobb elevators used to carefully elevate the paralumbar muscles off of the posterior elements using electrocautery carefully drilled bleeding and perform dissection of the muscle tissues of the preserving the facet at L2-3. Continuing the exposure out laterally to expose the mid portion of the facet joints at L2-3 L3-4, L4-5  bleeders controlled using electrocautery monopolar electrocautery. C-arm fluoroscopy the used to define the L4-5 when beginning to place pedicle screws. The level marked with the OR marking pen sterilely. Continued exposure then carried down to the lateral aspect of the L3 transverse process and continued on both sides down to the lumbosacral junction. Viper retractor was used for the upper part of the incision. Spinous processes of L3 L4-L5 were then resected down to the base the lamina at each segment the lower 50% of the spinous process of L2 was resected and Leksell rongeur used to resect inferior aspect of the lamina on the left side at the L2 level and partially on the right side at L2. The left inferior articular process of L3 and L4 were resected in order to provide for exposure of the left side L3-4 and L4-5 neuroforamen for ease of placement of TLIFs (transforaminal lumbar interbody fusion) essential portions of the lamina were also  resected first beginning with the Leksell rongeur and then resecting using 2 and 3 mm Kerrison.Laminectomy was carried out resecting the central portions of the lamina of L3 and L4 performing foraminotomies on the left side at the L5 level. The inferior articular process L3 and L4 were resected on the left side. The L5 nerve root identified bilaterally and the medial aspect of the L5 pedicle. Superior articular process of L5 was then resected from the left side further decompressing the left L4 nerve and providing for exposure of the area just superior to the L5 pedicle for a placement of cage. Returning to the left side decompression was carried out along the left side recut resecting the superior articular process of L3 overlying the L3 nerve root as it exited at the L3-4 level decompressing the lateral recess along the medial aspect of the pedicle of L3 and resecting the superior to the process of L4 and decompressing the left L3 neuroforamen. Loupe magnification and headlight were used during this portion procedure. At the L5 level similarly lateral recess and the neuroforamen were resected decompressing the L4 ad L5 nerve roots and foraminotomies was widely performed over  the left L3 nerve and L4 nerve roots.C-arm fluoroscopy was then brought into the field and using C-arm fluoroscopy then a hole made into the lateral aspect of the pedicle of L3 observed in the pedicle using ball-tipped nerve hook and hockey stick nerve probe initial entry was determined on fluoroscopy to be good position alignment so that a ball handled probe was then used to probe the left L3 pedicle to a depth of nearly 50 mm observed on C-arm fluoroscopy to be beyond the midpoint of the lumbar vertebra and then position alignment within the left L3 pedicle this was then removed and the pedicle channel probed demonstrating patency no sign of rupture the cortex of the pedicle. Tapping with a 4.5 then 5.5 and 6.65m screw tap then 6.5 mm x 50  mm screw was placed on the OR sterile field for use on the left side at the L3 level following the TLIF portion of the case. C-arm fluoroscopy was used to localize the hole made in the lateral aspect of the pedicle of L3 on the right localizing the pedicle within the spinal canal with nerve hook and hockey-stick nerve probe carefully passed down the center of the L3 pedicle to a depth of nearly 50 mm. Observed on C-arm fluoroscopy to be in good position alignment channel was probed with a ball-tipped probe ensure patency no sign of cortical disruption. Following tapping with a 4.5 mm, 5.5 and a 6.533mtap a  6.5 x 50 mm screw was placed on the right side pedicle at L3. C-arm fluoroscopy was then brought into the field and using C-arm fluoroscopy then a hole made into the lateral aspect of the pedicle of L4 observed in the pedicle using ball tipped nerve hook and hockey stick nerve probe initial entry was determined on fluoroscopy to be good position alignment so that a ball handled probe was then used to probe the left L4 pedicle to a depth of nearly 50 mm observed on C-arm fluoroscopy to be beyond the midpoint of the lumbar vertebra and then position alignment within the left L4 pedicle this was then removed and the pedicle channel probed demonstrating patency no sign of rupture the cortex of the pedicle. Tapping with a 4.5, 5.5 and 6.5 and 7.5 mm screw taps then 7.5 mm x 50 mm screw was placed on the OR field for later placement on the left side at the L4 level following the TLIF portion of the case.  C-arm fluoroscopy was then brought into the field and using C-arm fluoroscopy then a hole made into the lateral aspect of the pedicle of L4 observed in the pedicle using ball tipped nerve hook and hockey stick nerve probe initial entry was determined on fluoroscopy to be good position alignment so that a ball handled probe was then used to probe the left L4 pedicle to a depth of nearly 50 mm observed on C-arm  fluoroscopy to be beyond the midpoint of the lumbar vertebra and then position alignment within the right L4 pedicle this was then removed and the pedicle channel probed demonstrating patency no sign of rupture the cortex of the pedicle. Tapping with a 4.5, 5.5 and 6.5 and 7.5 mm screw taps then 7.5 mm x 50 mm screw was placed on the right side at the L4 level. C-arm fluoroscopy was then brought into the field and using C-arm fluoroscopy then a hole made into the lateral aspect of the left pedicle of L5 observed in the pedicle using ball tipped nerve  hook and hockey stick nerve probe initial entry was determined on fluoroscopy to be good position alignment so that a ball handled probe was then used to probe the left L5 pedicle to a depth of nearly 55 mm observed on C-arm fluoroscopy to be well aligned within the left L5 pedicle, the pedicle channel probed demonstrating patency no sign of rupture the cortex of the pedicle. Tapping with a 4.5, 5.5 and 6.5 and 7.5 mm screw tap then 7.5 mm x 55 mm screw as placed on OR field for later placement onthe left side at the L5 level following the TLIF portion of the case. C-arm fluoroscopy was used to localize the hole made in the lateral aspect of the pedicle of L5 on the right localizing the pedicle within the spinal canal with nerve hook and hockey-stick nerve probe carefully passed down the center of the L5 pedicle to a depth of nearly 55 mm. Observed on C-arm fluoroscopy to be in good position alignment channel was probed with a ball-tipped probe ensure patency no sign of cortical disruption. Following tapping with a 4.5, 5.5, 6.5 and 7.76m screw taps a 7.5 x 55 mm screw was placed on the right side pedicle at L5.  Attention then turned to placement of the transforaminal lumbar interbody fusion cages. Using a Penfield 4 the lateral aspect of the thecal sac and the inferior aspect of the L4 nerve root on the left side at the L4-5 level was carefully drilled. The thecal  sac could then easily be retracted in the posterior lateral aspect of the L4-5 disc was exposed 15 blade scalpel used to incise the osteotome used to resect a small portion of bone off the superior aspect of the posterior superior vertebral body of L5 in order to ease the entry into the L4-5 disc space. A pituitary rongeur was then able to be introduced in the disc space debrided it of degenerative disc material. 7 mm dilator was used to dialate the L4-5 disc space on the left side attempts were made to dilate further in intrements to 9 mm successfully and using small curettes and the disc space was debrided a minimal degenerative disc present in the endplates debrided to bleeding endplate bone. A 6-138mGlobus sable adjustable 8 degree lordotic cage was packed with morcellized local bone graft and the been harvested from previous laminotomies additional bone graft was then packed into the intervertebral disc space using the 8 mm trial impacted the graft multiple times. With this then a 6-12 mm Globus sable adjustable 8 degree lordotic cage was introdudced into the disc space on the left side in the correct degree convergence and then impacted then subset beneath the posterior aspect of the disc space by about 3 or 4 mm.The cage was adjusted to about 1054meight and then packed with vivigen via the funnel placed through the insertion handle. The insertion handle then removed. Bleeding controlled using bipolar electrocautery thrombin soaked gel cottonoids. Then turned to the left L3-4 level similarly the exposure the posterior lateral aspect this was carried out using a Penfield 4 bipolar electrocautery to control small bleeders present. Derricho retractor used to retract the thecal sac and nerve root, a 15 blade scalpel was used to incise posterior lateral aspect of the disc at the L3-4 disc space.The space was debrided of degenerative disc material using pituitary along root the entire disc space was then debrided  of degenerative disc material using pituitary rongeurs curettage down to bleeding bone endplates. Residual disc was  resected using pituitary. This space was then carefully sounded to a 9 mm cage trial provided the best fit the lordotic cage was chosen the 6-12 mm x 26 mm. The intervertebral disc space was then packed with autogenous local bone graft that been harvested from the central laminectomy and the trial was used to pack the graft using an 8 mm trial cage apparatus. This provided excellent bone graft within the intervertebral disc space at L3-4 so that the permanent 6-12 mm x 61m Globus sable adjustable 8 degree lordotic cage was then packed with local bone graft placed into the intervertebral disc space and impacted into place in the correct degree of convergence. Bleeding controlled using bipolar electrocautery. The cage was subset beneath the posterior aspect of the about 3-4 mm. The cage was adjusted to about 138mheight and then packed with vivigen via the funnel placed through the insertion handle. The insertion handle then removed.  Bleeding was hemostasis attention was turned to the left side where on the left side portion of the central laminectomy at the L2-3 and L3-4 levels carefully decompressing thecal sac and the appropriate nerve root L2, L3 and L4 at the L3-4 and L4-5. Hardware observed on C-arm fluoroscopy to be in good position alignment. With this then the transforaminal lumbar interbody fusion portion of the case was completed, bleeders were controlled using bipolar electrocautery thrombin-soaked Gelfoam were appropriate. The left L3, L4 and L5 pedicle screws were then each placed. 3 screws on the left and 3 screws on the right and then each carefully aligned and tightened or loose and a slight amount to allow for placement of rods. The rod was then placed into the pedicle screws on the left extending from L3-5 each of the caps carefully placed loosely tightened. Attention turned to the  right side were similarly and then screws were carefully adjusted to allow for a better pattern screws to allow for placement of fixation rod template for the rod was then taken and a precontoured quarter inch titanium rod was placed. The left L3 rod fastener cap was then tightened 85 pounds similarly on  At the right side disc space at L3-4 and L4-5 screw fasteners compression was obtained on the right side between L3 and L4. And at L4 and L5 by compressing between the facets right hand the screw caps tightened to 85 pounds. Similarly this was done on the left side at L3 and L4 screws were slightly compressed and tightened 85 pounds. Returning then to the L4-5 level compression was obtained The screw L5 fasteners caps and the L4 fasteners caps were tightened to 80 foot-pounds tIirrigation was carried out with copious amounts of saline solution this was done throughout the case. Cell Saver was used during the case.  Permanent C-arm images were obtained in AP and lateral plane. Remaining local bone graft was then applied along the left and right lateral posterior lateral region extending from L3 through L5 transverse processes. Excess Gelfoam was then removed the lumbodorsal musculature carefully exam debrided of any devitalized tissue following removal of Vicryl retractors were the bleeders were controlled using electrocautery and the area dorsal lumbar muscle were then approximated in the midline with interrupted #1 Vicryl sutures loose the dorsal fascia was re\re attached to the spinous process of L2 to superiorly and asked to history inferiorly this was done with #1 Vicryl sutures.  Subcutaneous layers then approximated over the drain using interrupted 0 Vicryl sutures and 2-0 Vicryl sutures. Skin was closed with stainless steel  staples then MedPlex bandage. All instrument and sponge counts were correct. The patient was then returned to a supine position on her bed reactivated extubated and returned to the  recovery room in satisfactory condition.    Benjiman Core, PA-C perform the duties of assistant surgeon during this case. He was present from the beginning of the case to the end of the case assisting in transfer the patient from his stretcher to the OR table and back to the stretcher at the end of the case. Assisted in careful retraction and suction of the laminectomy site delicate neural structures operating under the operating room microscope. He performed closure of the incision from the fascia to the skin applying the dressing.     [name]  08/31/2021, 2:25 PM

## 2021-09-01 LAB — CBC
HCT: 25.7 % — ABNORMAL LOW (ref 36.0–46.0)
Hemoglobin: 8.3 g/dL — ABNORMAL LOW (ref 12.0–15.0)
MCH: 27.1 pg (ref 26.0–34.0)
MCHC: 32.3 g/dL (ref 30.0–36.0)
MCV: 84 fL (ref 80.0–100.0)
Platelets: 166 10*3/uL (ref 150–400)
RBC: 3.06 MIL/uL — ABNORMAL LOW (ref 3.87–5.11)
RDW: 14.6 % (ref 11.5–15.5)
WBC: 7.9 10*3/uL (ref 4.0–10.5)
nRBC: 0 % (ref 0.0–0.2)

## 2021-09-01 LAB — BASIC METABOLIC PANEL
Anion gap: 10 (ref 5–15)
BUN: 16 mg/dL (ref 8–23)
CO2: 23 mmol/L (ref 22–32)
Calcium: 9 mg/dL (ref 8.9–10.3)
Chloride: 104 mmol/L (ref 98–111)
Creatinine, Ser: 1 mg/dL (ref 0.44–1.00)
GFR, Estimated: >60 mL/min (ref >60)
Glucose, Bld: 177 mg/dL — ABNORMAL HIGH (ref 70–99)
Potassium: 3.5 mmol/L (ref 3.5–5.1)
Sodium: 137 mmol/L (ref 135–145)

## 2021-09-01 LAB — GLUCOSE, CAPILLARY
Glucose-Capillary: 175 mg/dL — ABNORMAL HIGH (ref 70–99)
Glucose-Capillary: 136 mg/dL — ABNORMAL HIGH (ref 70–99)
Glucose-Capillary: 151 mg/dL — ABNORMAL HIGH (ref 70–99)
Glucose-Capillary: 169 mg/dL — ABNORMAL HIGH (ref 70–99)

## 2021-09-01 MED ORDER — PANTOPRAZOLE SODIUM 40 MG PO TBEC
40.0000 mg | DELAYED_RELEASE_TABLET | Freq: Every day | ORAL | Status: DC
  Administered 2021-09-01 – 2021-09-03 (×3): 40 mg via ORAL
  Filled 2021-09-01 (×3): qty 1

## 2021-09-01 NOTE — Evaluation (Signed)
Physical Therapy Evaluation ?Patient Details ?Name: Carol Wilkins ?MRN: [id number] ?DOB: Nov 10, 1955 ?Today's Date: 09/01/2021 ? ?History of Present Illness ? Pt is a 66 y.o. F who presents s/p L3-5 TLIF 08/31/2021. Significant PMH: HTN.  ?Clinical Impression ? PTA, pt lives with her spouse and is independent. Pt presents with good pain control (premedicated prior to session) and denies radicular pain. Overall, is mobilizing fairly well, ambulating 80 ft with a walker at a supervision level. Demonstrates BLE weakness, mild balance deficits and decreased gait speed. Reviewed spinal precautions, activity recommendations, car transfer technique. Will benefit from further gait and stair training prior to d/c home. ?   ? ?Recommendations for follow up therapy are one component of a multi-disciplinary discharge planning process, led by the attending physician.  Recommendations may be updated based on patient status, additional functional criteria and insurance authorization. ? ?Follow Up Recommendations No PT follow up ? ?  ?Assistance Recommended at Discharge PRN  ?Patient can return home with the following ? A little help with walking and/or transfers;A little help with bathing/dressing/bathroom;Assistance with cooking/housework;Assist for transportation ? ?  ?Equipment Recommendations Rolling walker (2 wheels)  ?Recommendations for Other Services ?    ?  ?Functional Status Assessment Patient has had a recent decline in their functional status and demonstrates the ability to make significant improvements in function in a reasonable and predictable amount of time.  ? ?  ?Precautions / Restrictions Precautions ?Precautions: Fall;Back ?Precaution Booklet Issued: Yes (comment) ?Precaution Comments: verbally reviewed, provided written handout ?Required Braces or Orthoses: Spinal Brace ?Spinal Brace: Lumbar corset;Applied in sitting position ?Restrictions ?Weight Bearing Restrictions: No  ? ?  ? ?Mobility ? Bed  Mobility ?Overal bed mobility: Needs Assistance ?Bed Mobility: Rolling, Sidelying to Sit ?Rolling: Min guard ?Sidelying to sit: Min guard ?  ?  ?  ?General bed mobility comments: Cues for log roll technique, HOB flat, use of rail ?  ? ?Transfers ?Overall transfer level: Needs assistance ?Equipment used: Rolling walker (2 wheels) ?Transfers: Sit to/from Stand ?Sit to Stand: Supervision, Min guard ?  ?  ?  ?  ?  ?General transfer comment: Supervision-min guard for transfers, cues for hand placement ?  ? ?Ambulation/Gait ?Ambulation/Gait assistance: Supervision ?Gait Distance (Feet): 80 Feet ?Assistive device: Rolling walker (2 wheels) ?Gait Pattern/deviations: Step-through pattern, Decreased stride length ?Gait velocity: decreased ?Gait velocity interpretation: <1.8 ft/sec, indicate of risk for recurrent falls ?  ?General Gait Details: Slow pace, no overt LOB, supervision for safety ? ?Stairs ?  ?  ?  ?  ?  ? ?Wheelchair Mobility ?  ? ?Modified Rankin (Stroke Patients Only) ?  ? ?  ? ?Balance Overall balance assessment: Mild deficits observed, not formally tested ?  ?  ?  ?  ?  ?  ?  ?  ?  ?  ?  ?  ?  ?  ?  ?  ?  ?  ?   ? ? ? ?Pertinent Vitals/Pain Pain Assessment ?Pain Assessment: Faces ?Faces Pain Scale: Hurts a little bit ?Pain Location: back ?Pain Descriptors / Indicators: Operative site guarding ?Pain Intervention(s): Monitored during session  ? ? ?Home Living Family/patient expects to be discharged to:: Private residence ?Living Arrangements: Spouse/significant other ?Available Help at Discharge: Family ?Type of Home: House ?Home Access: Stairs to enter ?Entrance Stairs-Rails: Right;Left ?Entrance Stairs-Number of Steps: 5 ?  ?Home Layout: One level ?Home Equipment: None ?   ?  ?Prior Function Prior Level of Function : Independent/Modified Independent ?  ?  ?  ?  ?  ?  ?  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  ?  ? ? ?Hand Dominance  ? Dominant Hand: Right ? ?  ?Extremity/Trunk  Assessment  ? Upper Extremity Assessment ?Upper Extremity Assessment: Defer to OT evaluation ?  ? ?Lower Extremity Assessment ?Lower Extremity Assessment: RLE deficits/detail;LLE deficits/detail ?RLE Deficits / Details: Grossly 3+/5 ?LLE Deficits / Details: Grossly 3+/5 ?  ? ?Cervical / Trunk Assessment ?Cervical / Trunk Assessment: Back Surgery  ?Communication  ? Communication: No difficulties  ?Cognition Arousal/Alertness: Awake/alert ?Behavior During Therapy: [facility] for tasks assessed/performed ?Overall Cognitive Status: Within Functional Limits for tasks assessed ?  ?  ?  ?  ?  ?  ?  ?  ?  ?  ?  ?  ?  ?  ?  ?  ?  ?  ?  ? ?  ?General Comments   ? ?  ?Exercises    ? ?Assessment/Plan  ?  ?PT Assessment Patient needs continued PT services  ?PT Problem List Decreased strength;Decreased activity tolerance;Decreased balance;Decreased mobility;Pain ? ?   ?  ?PT Treatment Interventions DME instruction;Stair training;Gait training;Functional mobility training;Therapeutic activities;Therapeutic exercise;Balance training;Patient/family education   ? ?PT Goals (Current goals can be found in the Care Plan section)  ?Acute Rehab PT Goals ?Patient Stated Goal: to heal ?PT Goal Formulation: With patient ?Time For Goal Achievement: 09/15/21 ?Potential to Achieve Goals: Good ? ?  ?Frequency Min 5X/week ?  ? ? ?Co-evaluation   ?  ?  ?  ?  ? ? ?  ?AM-PAC PT "6 Clicks" Mobility  ?Outcome Measure Help needed turning from your back to your side while in a flat bed without using bedrails?: A Little ?Help needed moving from lying on your back to sitting on the side of a flat bed without using bedrails?: A Little ?Help needed moving to and from a bed to a chair (including a wheelchair)?: A Little ?Help needed standing up from a chair using your arms (e.g., wheelchair or bedside chair)?: A Little ?Help needed to walk in hospital room?: A Little ?Help needed climbing 3-5 steps with a railing? : A Little ?6 Click Score: 18 ? ?  ?End of Session  Equipment Utilized During Treatment: Back brace ?Activity Tolerance: Patient tolerated treatment well ?Patient left: in chair;with call bell/phone within reach ?Nurse Communication: Mobility status ?PT Visit Diagnosis: Unsteadiness on feet (R26.81);Difficulty in walking, not elsewhere classified (R26.2);Pain ?Pain - part of body:  (back) ?  ? ?Time: 1749-4496 ?PT Time Calculation (min) (ACUTE ONLY): 26 min ? ? ?Charges:   PT Evaluation ?$PT Eval Low Complexity: 1 Low ?PT Treatments ?$Therapeutic Activity: 8-22 mins ?  ?   ? ? ?Wyona Almas, PT, DPT ?Acute Rehabilitation Services ?Pager [phone] ?Office [phone] ? ? ?Carol Wilkins ?09/01/2021, 9:52 AM ? ?

## 2021-09-01 NOTE — Plan of Care (Signed)
?  Problem: Education: Goal: Knowledge of General Education information will improve Description: Including pain rating scale, medication(s)/side effects and non-pharmacologic comfort measures Outcome: Progressing   Problem: Health Behavior/Discharge Planning: Goal: Ability to manage health-related needs will improve Outcome: Progressing   Problem: Activity: Goal: Risk for activity intolerance will decrease Outcome: Progressing   Problem: Nutrition: Goal: Adequate nutrition will be maintained Outcome: Progressing   Problem: Coping: Goal: Level of anxiety will decrease Outcome: Progressing   Problem: Elimination: Goal: Will not experience complications related to bowel motility Outcome: Progressing   Problem: Pain Managment: Goal: General experience of comfort will improve Outcome: Progressing   Problem: Safety: Goal: Ability to remain free from injury will improve Outcome: Progressing   

## 2021-09-01 NOTE — Progress Notes (Signed)
?  Transition of Care (TOC) Screening Note ? ? ?Patient Details  ?Name: Carol Wilkins ?Date of Birth: 1955/12/16 ? ? ?Transition of Care [facility]) CM/SW Contact:    ?Sharin Mons, RN ?Phone Number: ?09/01/2021, 4:21 PM ? ?     - presents s/p L3-5 TLIF 08/31/2021 ? ?Transition of Care Department [facility]) has reviewed patient. Pt from home with spouse. Orders noted for DME. Referral made with [employer] . Equipment ( 3in1/bsc, RW and shower stool) will be delivered to bedside prior to d/c. Pt without home health needs per PT/OT recommendations.  ?TOC team will continue to monitor patient advancement through interdisciplinary progression rounds. If new patient transition needs arise, please place a TOC consult. ?Whitman Hero RN,BSN,CM ?  ?

## 2021-09-01 NOTE — Evaluation (Signed)
Occupational Therapy Evaluation ?Patient Details ?Name: Carol Wilkins ?MRN: [id number] ?DOB: 02/04/1956 ?Today's Date: 09/01/2021 ? ? ?History of Present Illness 66 y.o. F who presents s/p L3-5 TLIF 08/31/2021. Significant PMH: HTN.  ? ?Clinical Impression ?  ?Patient is s/p TLIF L3-5 surgery resulting in functional limitations due to the deficits listed below (see OT problem list). Pt with good return demo of don and doff brace mod I. Pt able to complete basic transfer. Pt is at adequate level for d/c home with family (A) Patient will benefit from skilled OT acutely to increase independence and safety with ADLS to allow discharge home. ?  ?   ? ?Recommendations for follow up therapy are one component of a multi-disciplinary discharge planning process, led by the attending physician.  Recommendations may be updated based on patient status, additional functional criteria and insurance authorization.  ? ?Follow Up Recommendations ? No OT follow up  ?  ?Assistance Recommended at Discharge None  ?Patient can return home with the following Assist for transportation ? ?  ?Functional Status Assessment ? Patient has had a recent decline in their functional status and demonstrates the ability to make significant improvements in function in a reasonable and predictable amount of time.  ?Equipment Recommendations ? Tub/shower bench (pt expressed needing a bench)  ?  ?Recommendations for Other Services   ? ? ?  ?Precautions / Restrictions Precautions ?Precautions: Fall;Back ?Precaution Booklet Issued: Yes (comment) ?Precaution Comments: verbally reviewed, provided written handout regarding adls ?Required Braces or Orthoses: Spinal Brace ?Spinal Brace: Lumbar corset;Applied in sitting position ?Restrictions ?Weight Bearing Restrictions: No  ? ?  ? ?Mobility Bed Mobility ?  ?  ?  ?  ?  ?  ?  ?General bed mobility comments: oob on arrival ?  ? ?Transfers ?Overall transfer level: Needs assistance ?Equipment used: Rolling walker  (2 wheels) ?Transfers: Sit to/from Stand ?Sit to Stand: Supervision, Min guard ?  ?  ?  ?  ?  ?General transfer comment: Supervision-min guard for transfers, cues for hand placement ?  ? ?  ?Balance Overall balance assessment: Mild deficits observed, not formally tested ?  ?  ?  ?  ?  ?  ?  ?  ?  ?  ?  ?  ?  ?  ?  ?  ?  ?  ?   ? ?ADL either performed or assessed with clinical judgement  ? ?ADL Overall ADL's : Needs assistance/impaired ?Eating/Feeding: Independent ?  ?Grooming: Wash/dry hands;Modified independent;Standing ?  ?Upper Body Bathing: Modified independent;Sitting ?  ?Lower Body Bathing: Supervison/ safety;Sit to/from stand;With adaptive equipment ?Lower Body Bathing Details (indicate cue type and reason): able to figure 4 cross bil LE and able to reach foot of R LE. pt with increased time will be able to reach L LE ?Upper Body Dressing : Set up;Sitting ?Upper Body Dressing Details (indicate cue type and reason): able to don doff back brace. pt with mod cues for sequence of brace ?  ?  ?Toilet Transfer: Supervision/safety ?  ?Toileting- Clothing Manipulation and Hygiene: Supervision/safety ?  ?  ?  ?Functional mobility during ADLs: Supervision/safety;Rolling walker (2 wheels) ?   ?Back handout provided and reviewed adls in detail. Pt educated on: clothing between brace, never sleep in brace, set an alarm at night for medication, avoid sitting for long periods of time, correct bed positioning for sleeping, correct sequence for bed mobility, avoiding lifting more than 5 pounds and never wash directly over incision. All education is complete  and patient indicates understanding. ? ? ? ?Vision Baseline Vision/History: 1 Wears glasses ?Ability to See in Adequate Light: 0 Adequate ?   ?   ?Perception   ?  ?Praxis   ?  ? ?Pertinent Vitals/Pain Pain Assessment ?Pain Assessment: Faces ?Faces Pain Scale: Hurts a little bit ?Pain Location: back ?Pain Descriptors / Indicators: Operative site guarding ?Pain  Intervention(s): Monitored during session, Premedicated before session, Repositioned  ? ? ? ?Hand Dominance Right ?  ?Extremity/Trunk Assessment Upper Extremity Assessment ?Upper Extremity Assessment: Overall WFL for tasks assessed ?  ?Lower Extremity Assessment ?Lower Extremity Assessment: Defer to PT evaluation ?  ?Cervical / Trunk Assessment ?Cervical / Trunk Assessment: Back Surgery ?  ?Communication Communication ?Communication: No difficulties ?  ?Cognition Arousal/Alertness: Awake/alert ?Behavior During Therapy: [facility] for tasks assessed/performed ?Overall Cognitive Status: Within Functional Limits for tasks assessed ?  ?  ?  ?  ?  ?  ?  ?  ?  ?  ?  ?  ?  ?  ?  ?  ?  ?  ?  ?General Comments    ? ?  ?Exercises   ?  ?Shoulder Instructions    ? ? ?Home Living Family/patient expects to be discharged to:: Private residence ?Living Arrangements: Spouse/significant other ?Available Help at Discharge: Family ?Type of Home: House ?Home Access: Stairs to enter ?Entrance Stairs-Number of Steps: 5 ?Entrance Stairs-Rails: Right;Left ?Home Layout: One level ?  ?  ?Bathroom Shower/Tub: Tub/shower unit ?  ?Bathroom Toilet: Standard ?  ?  ?Home Equipment: None ?  ?Additional Comments: reports buying wet wipes and can get a reacher from sister ?  ? ?  ?Prior Functioning/Environment Prior Level of Function : Independent/Modified Independent ?  ?  ?  ?  ?  ?  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  ?  ? ?  ?  ?OT Problem List: Impaired balance (sitting and/or standing);Decreased activity tolerance;Decreased knowledge of precautions;Decreased knowledge of use of DME or AE;Decreased safety awareness ?  ?   ?OT Treatment/Interventions: Self-care/ADL training;DME and/or AE instruction;Therapeutic activities  ?  ?OT Goals(Current goals can be found in the care plan section) Acute Rehab OT Goals ?Patient Stated Goal: to stay tonight ?OT Goal Formulation: With patient ?Time For Goal Achievement:  09/15/21 ?Potential to Achieve Goals: Good  ?OT Frequency: Min 2X/week ?  ? ?Co-evaluation   ?  ?  ?  ?  ? ?  ?AM-PAC OT "6 Clicks" Daily Activity     ?Outcome Measure Help from another person eating meals?: None ?Help from another person taking care of personal grooming?: None ?Help from another person toileting, which includes using toliet, bedpan, or urinal?: None ?Help from another person bathing (including washing, rinsing, drying)?: None ?Help from another person to put on and taking off regular upper body clothing?: None ?Help from another person to put on and taking off regular lower body clothing?: A Little ?6 Click Score: 23 ?  ?End of Session Equipment Utilized During Treatment: Rolling walker (2 wheels) ?Nurse Communication: Mobility status;Precautions ? ?Activity Tolerance: Patient tolerated treatment well ?Patient left: in chair;with call bell/phone within reach ? ?OT Visit Diagnosis: Unsteadiness on feet (R26.81)  ?              ?Time: 1740-8144 ?OT Time Calculation (min): 28 min ?Charges:  OT General Charges ?$OT Visit: 1 Visit ?OT Evaluation ?$OT Eval Moderate Complexity: 1 Mod ?OT Treatments ?$Self Care/Home Management : 8-22 mins ? ? ?Brynn, OTR/L  ?  Acute Rehabilitation Services ?Office: [phone] ?. ? ? ?Carol Wilkins ?09/01/2021, 3:02 PM ?

## 2021-09-02 LAB — CBC WITH DIFFERENTIAL/PLATELET
Abs Immature Granulocytes: 0.06 10*3/uL (ref 0.00–0.07)
Basophils Absolute: 0 10*3/uL (ref 0.0–0.1)
Basophils Relative: 0 %
Eosinophils Absolute: 0 10*3/uL (ref 0.0–0.5)
Eosinophils Relative: 0 %
HCT: 26 % — ABNORMAL LOW (ref 36.0–46.0)
Hemoglobin: 8.3 g/dL — ABNORMAL LOW (ref 12.0–15.0)
Immature Granulocytes: 1 %
Lymphocytes Relative: 10 %
Lymphs Abs: 0.9 10*3/uL (ref 0.7–4.0)
MCH: 27.3 pg (ref 26.0–34.0)
MCHC: 31.9 g/dL (ref 30.0–36.0)
MCV: 85.5 fL (ref 80.0–100.0)
Monocytes Absolute: 0.8 10*3/uL (ref 0.1–1.0)
Monocytes Relative: 9 %
Neutro Abs: 7.3 10*3/uL (ref 1.7–7.7)
Neutrophils Relative %: 80 %
Platelets: 178 10*3/uL (ref 150–400)
RBC: 3.04 MIL/uL — ABNORMAL LOW (ref 3.87–5.11)
RDW: 15.3 % (ref 11.5–15.5)
WBC: 9.2 10*3/uL (ref 4.0–10.5)
nRBC: 0 % (ref 0.0–0.2)

## 2021-09-02 LAB — GLUCOSE, CAPILLARY
Glucose-Capillary: 144 mg/dL — ABNORMAL HIGH (ref 70–99)
Glucose-Capillary: 82 mg/dL (ref 70–99)
Glucose-Capillary: 184 mg/dL — ABNORMAL HIGH (ref 70–99)
Glucose-Capillary: 97 mg/dL (ref 70–99)

## 2021-09-02 MED ORDER — POLYETHYLENE GLYCOL 3350 17 G PO PACK
17.0000 g | PACK | Freq: Every day | ORAL | Status: DC
  Administered 2021-09-02: 17 g via ORAL
  Filled 2021-09-02 (×3): qty 1

## 2021-09-02 MED ORDER — FERROUS GLUCONATE 324 (38 FE) MG PO TABS
324.0000 mg | ORAL_TABLET | Freq: Two times a day (BID) | ORAL | Status: DC
Start: 2021-09-02 — End: 2021-09-04
  Administered 2021-09-02 – 2021-09-03 (×3): 324 mg via ORAL
  Filled 2021-09-02 (×4): qty 1

## 2021-09-02 NOTE — Progress Notes (Signed)
Physical Therapy Treatment Patient Details Name: Carol Wilkins MRN: [id number] DOB: 06-May-1955 Today's Date: 09/02/2021   History of Present Illness 66 y.o. F who presents s/p L3-5 TLIF 08/31/2021. Significant PMH: HTN.    PT Comments    Pt received OOB on way to BR with NT on arrival. Pt min guard throughout ambulation with mild instability noted when turning with pt being far away from RW. Extra emphasis placed on safety with turns during session with pt able to complete x3 more times throughout session with cues for RW proximity, without LOB and with increased stability. Pt able to recall all back precautions with light prompts and demonstrate compliance with donning undergarments, robe and lumbar corset. Pt able to recall sequencing of stair ascent/descent with light prompts. Session limited to pt stated tolerance with pt requesting to sit and rest. Educated pt re; increasing activity tolerance slowly and energy conservation, pt verbalizing understanding. Pt continues to benefit from skilled PT services to progress toward functional mobility goals.     Recommendations for follow up therapy are one component of a multi-disciplinary discharge planning process, led by the attending physician.  Recommendations may be updated based on patient status, additional functional criteria and insurance authorization.  Follow Up Recommendations  No PT follow up     Assistance Recommended at Discharge PRN  Patient can return home with the following A little help with walking and/or transfers;A little help with bathing/dressing/bathroom;Assistance with cooking/housework;Assist for transportation   Equipment Recommendations  Rolling walker (2 wheels)    Recommendations for Other Services       Precautions / Restrictions Precautions Precautions: Fall;Back Precaution Booklet Issued: Yes (comment) Precaution Comments: verbally reviewed, pt able to recall all with prompts Required Braces or  Orthoses: Spinal Brace Spinal Brace: Lumbar corset;Applied in sitting position Restrictions Weight Bearing Restrictions: No     Mobility  Bed Mobility Overal bed mobility: Needs Assistance Bed Mobility: Rolling, Sidelying to Sit           General bed mobility comments: oob on arrival, able to verbally teach back logg roll technique    Transfers Overall transfer level: Needs assistance Equipment used: Rolling walker (2 wheels) Transfers: Sit to/from Stand Sit to Stand: Supervision, Min guard           General transfer comment: Supervision-min guard for transfers    Ambulation/Gait Ambulation/Gait assistance: Supervision Gait Distance (Feet): 100 Feet Assistive device: Rolling walker (2 wheels) Gait Pattern/deviations: Step-through pattern, Decreased stride length Gait velocity: decreased     General Gait Details: Slow pace, no overt LOB, supervision for safety   Stairs Stairs: Yes       General stair comments: verbally reviewed sequencing, able to recall with light prompts   Wheelchair Mobility    Modified Rankin (Stroke Patients Only)       Balance Overall balance assessment: Mild deficits observed, not formally tested                                          Cognition Arousal/Alertness: Awake/alert Behavior During Therapy: WFL for tasks assessed/performed Overall Cognitive Status: Within Functional Limits for tasks assessed                                          Exercises  General Comments General comments (skin integrity, edema, etc.): VSS on RA      Pertinent Vitals/Pain Pain Assessment Pain Assessment: Faces Faces Pain Scale: Hurts a little bit Pain Location: back Pain Descriptors / Indicators: Operative site guarding Pain Intervention(s): Limited activity within patient's tolerance, Monitored during session, Premedicated before session    Home Living                           Prior Function            PT Goals (current goals can now be found in the care plan section) Acute Rehab PT Goals Patient Stated Goal: to heal PT Goal Formulation: With patient Time For Goal Achievement: 09/15/21    Frequency    Min 5X/week      PT Plan      Co-evaluation              AM-PAC PT "6 Clicks" Mobility   Outcome Measure  Help needed turning from your back to your side while in a flat bed without using bedrails?: A Little Help needed moving from lying on your back to sitting on the side of a flat bed without using bedrails?: A Little Help needed moving to and from a bed to a chair (including a wheelchair)?: A Little Help needed standing up from a chair using your arms (e.g., wheelchair or bedside chair)?: A Little Help needed to walk in hospital room?: A Little Help needed climbing 3-5 steps with a railing? : A Little 6 Click Score: 18    End of Session Equipment Utilized During Treatment: Back brace Activity Tolerance: Patient tolerated treatment well Patient left: in chair;with call bell/phone within reach Nurse Communication: Mobility status PT Visit Diagnosis: Unsteadiness on feet (R26.81);Difficulty in walking, not elsewhere classified (R26.2);Pain Pain - part of body:  (back)     Time: 0086-7619 PT Time Calculation (min) (ACUTE ONLY): 21 min  Charges:  $Therapeutic Activity: 8-22 mins                     Johana Hopkinson R. PTA Acute Rehabilitation Services Office: [place] 09/02/2021, 10:24 AM

## 2021-09-02 NOTE — Progress Notes (Signed)
Mobility Specialist Progress Note    09/02/21 1151  Mobility  Activity Ambulated with assistance in hallway  Level of Assistance Standby assist, set-up cues, supervision of patient - no hands on  Assistive Device Front wheel walker  Distance Ambulated (ft) 130 ft  Activity Response Tolerated well  $Mobility charge 1 Mobility   Pt received in chair and agreeable. C/o some back pain and a headache on walk. Returned to chair with call bell in reach. RN notified.   Hildred Alamin Mobility Specialist  Primary: 5N M.S. Phone: [phone] Secondary: 6N M.S. Phone: [phone]

## 2021-09-02 NOTE — Progress Notes (Signed)
     Subjective: 2 Days Post-Op Procedure(s) (LRB): LEFT Lumbar three - lumbar four AND Lumbar four- lumbar five TRANSFORAMINAL LUMBAR INTERBODY FUSION WITH RODS, SCREWS AND CAGES, LOCAL BONE GRAFT, ALLOGRAFT BONE GRAFT AND VIVIGEN (N/A) A little light headed with transition to standing. No BM yet. Some increased pain with the local anesthetic wearing off. Did not do stair as yet. Need to check CBC. Start Ferrous gluconate. Unable to place a note from remote yesterday due to [place]. It is resolved today apparently. She is tolerating diet, and is taking oral narcotic. Needs to be able to do stairs prior to discharge. Husband is going to help her for 2 weeks.   Patient reports pain as moderate.    Objective:   VITALS:  Temp:  [98.2 F (36.8 C)-98.9 F (37.2 C)] 98.7 F (37.1 C) (05/18 0714) Pulse Rate:  [82-101] 100 (05/18 0714) Resp:  [17] 17 (05/18 0714) BP: (103-127)/(44-63) 127/63 (05/18 0714) SpO2:  [91 %-100 %] 91 % (05/18 0714)  Neurologically intact ABD soft Neurovascular intact Sensation intact distally Intact pulses distally Dorsiflexion/Plantar flexion intact Incision: dressing C/D/I and no drainage Compartment soft   LABS Recent Labs    08/31/21 1050 09/01/21 0436  HGB 9.5* 8.3*  WBC  --  7.9  PLT  --  166   Recent Labs    08/31/21 1050 09/01/21 0436  NA 138 137  K 3.4* 3.5  CL 104 104  CO2  --  23  BUN 20 16  CREATININE 1.10* 1.00  GLUCOSE 150* 177*   No results for input(s): LABPT, INR in the last 72 hours.   Assessment/Plan: 2 Days Post-Op Procedure(s) (LRB): LEFT Lumbar three - lumbar four AND Lumbar four- lumbar five TRANSFORAMINAL LUMBAR INTERBODY FUSION WITH RODS, SCREWS AND CAGES, LOCAL BONE GRAFT, ALLOGRAFT BONE GRAFT AND VIVIGEN (N/A)  Advance diet Up with therapy D/C IV fluids Plan for discharge tomorrow  [name] 09/02/2021, 12:45 PM Patient ID: Carol Wilkins, female   DOB: [birth year], 66 y.o.   MRN:  [id number]

## 2021-09-03 LAB — GLUCOSE, CAPILLARY
Glucose-Capillary: 123 mg/dL — ABNORMAL HIGH (ref 70–99)
Glucose-Capillary: 140 mg/dL — ABNORMAL HIGH (ref 70–99)
Glucose-Capillary: 144 mg/dL — ABNORMAL HIGH (ref 70–99)
Glucose-Capillary: 188 mg/dL — ABNORMAL HIGH (ref 70–99)

## 2021-09-03 MED ORDER — GABAPENTIN 300 MG PO CAPS: 300.0000 mg | ORAL_CAPSULE | Freq: Two times a day (BID) | ORAL | 1 refills | Status: DC

## 2021-09-03 MED ORDER — OXYCODONE HCL ER 10 MG PO T12A: 10.0000 mg | EXTENDED_RELEASE_TABLET | Freq: Two times a day (BID) | ORAL | 0 refills | Status: DC

## 2021-09-03 MED ORDER — AMLODIPINE BESYLATE 5 MG PO TABS
5.0000 mg | ORAL_TABLET | Freq: Every day | ORAL | Status: DC
  Administered 2021-09-03 – 2021-09-04 (×2): 5 mg via ORAL
  Filled 2021-09-03 (×2): qty 1

## 2021-09-03 MED ORDER — METHOCARBAMOL 500 MG PO TABS: 500.0000 mg | ORAL_TABLET | Freq: Three times a day (TID) | ORAL | 1 refills | Status: DC | PRN

## 2021-09-03 MED ORDER — OXYCODONE HCL 5 MG PO TABS
5.0000 mg | ORAL_TABLET | ORAL | 0 refills | Status: DC | PRN
Start: 2021-09-03 — End: 2021-11-10

## 2021-09-03 MED ORDER — POLYETHYLENE GLYCOL 3350 17 G PO PACK: 17.0000 g | PACK | Freq: Every day | ORAL | 0 refills | Status: DC | PRN

## 2021-09-03 MED ORDER — FERROUS GLUCONATE 324 (38 FE) MG PO TABS: 324.0000 mg | ORAL_TABLET | Freq: Two times a day (BID) | ORAL | 1 refills | Status: DC

## 2021-09-03 MED ORDER — AMLODIPINE BESYLATE 10 MG PO TABS: 5.0000 mg | ORAL_TABLET | Freq: Every day | ORAL | 0 refills | Status: AC

## 2021-09-03 MED ORDER — DOCUSATE SODIUM 100 MG PO CAPS: 100.0000 mg | ORAL_CAPSULE | Freq: Two times a day (BID) | ORAL | 0 refills | Status: DC

## 2021-09-03 MED FILL — Heparin Sodium (Porcine) Inj 1000 Unit/ML: INTRAMUSCULAR | Qty: 30 | Status: AC

## 2021-09-03 MED FILL — Sodium Chloride IV Soln 0.9%: INTRAVENOUS | Qty: 1000 | Status: AC

## 2021-09-03 MED FILL — Sodium Chloride Irrigation Soln 0.9%: Qty: 1000 | Status: AC

## 2021-09-03 NOTE — Progress Notes (Signed)
     Subjective: 3 Days Post-Op Procedure(s) (LRB): LEFT Lumbar three - lumbar four AND Lumbar four- lumbar five TRANSFORAMINAL LUMBAR INTERBODY FUSION WITH RODS, SCREWS AND CAGES, LOCAL BONE GRAFT, ALLOGRAFT BONE GRAFT AND VIVIGEN (N/A) Awake, alert and oriented x 4. Voiding without difficulty. Taking miralax.  Hgb is 8.3 stable and unchanged over 24 hours. Tolerating po nourishment and narcotics.  Patient reports pain as mild.    Objective:   VITALS:  Temp:  [97.9 F (36.6 C)-98.7 F (37.1 C)] 98.7 F (37.1 C) (05/19 0700) Pulse Rate:  [81-97] 97 (05/19 0700) Resp:  [17-19] 18 (05/19 0700) BP: (104-124)/(52-55) 118/55 (05/19 0700) SpO2:  [91 %-97 %] 94 % (05/19 0700)  Neurologically intact ABD soft Neurovascular intact Sensation intact distally Intact pulses distally Dorsiflexion/Plantar flexion intact Incision: dressing C/D/I and no drainage   LABS Recent Labs    08/31/21 1050 09/01/21 0436 09/02/21 1357  HGB 9.5* 8.3* 8.3*  WBC  --  7.9 9.2  PLT  --  166 178   Recent Labs    08/31/21 1050 09/01/21 0436  NA 138 137  K 3.4* 3.5  CL 104 104  CO2  --  23  BUN 20 16  CREATININE 1.10* 1.00  GLUCOSE 150* 177*   No results for input(s): LABPT, INR in the last 72 hours.   Assessment/Plan: 3 Days Post-Op Procedure(s) (LRB): LEFT Lumbar three - lumbar four AND Lumbar four- lumbar five TRANSFORAMINAL LUMBAR INTERBODY FUSION WITH RODS, SCREWS AND CAGES, LOCAL BONE GRAFT, ALLOGRAFT BONE GRAFT AND VIVIGEN (N/A)  Advance diet Up with therapy Discharge home today. Ferrous gluconate and warning to transition slowly due to anemia.   [name] 09/03/2021, 8:48 AM Patient ID: Carol Wilkins, female   DOB: [birth year], 66 y.o.   MRN: [id number]

## 2021-09-03 NOTE — Progress Notes (Signed)
Physical Therapy Treatment Patient Details Name: Carol Wilkins MRN: [id number] DOB: 04-29-1955 Today's Date: 09/03/2021   History of Present Illness 66 y.o. F who presents s/p L3-5 TLIF 08/31/2021. Significant PMH: HTN.    PT Comments    Pt received OOB in chair with physician present and agreeable to session with focus on safe stair ascent/descent for safe d/c to home. Pt able to don lumbar corset independently in sitting at start of session. Pt able to demonstrate safe stair ascent/descent with slight knee buckling observed secondary to weakness, pt able to self correct in all instances and no overt LOB noted. Pt continues to demonstrate safe ambulation and transfers at supervision level. Pt provided with HEP for increased ROM and LE strength, all exercises reviewed and pt able to demonstrate all.  Pt educated re; increasing activity tolerance slowly, HEP compliance and activity recommendations, pt verbalizing understanding. Anticipate safe discharge with family assistance once medically cleared, will follow acutely. Pt continues to benefit from skilled PT services to progress toward functional mobility goals.    Recommendations for follow up therapy are one component of a multi-disciplinary discharge planning process, led by the attending physician.  Recommendations may be updated based on patient status, additional functional criteria and insurance authorization.  Follow Up Recommendations  No PT follow up     Assistance Recommended at Discharge PRN  Patient can return home with the following A little help with walking and/or transfers;A little help with bathing/dressing/bathroom;Assistance with cooking/housework;Assist for transportation   Equipment Recommendations  Rolling walker (2 wheels)    Recommendations for Other Services       Precautions / Restrictions Precautions Precautions: Fall;Back Precaution Booklet Issued: Yes (comment) Precaution Comments: verbally reviewed,  pt able to recall all with prompts Required Braces or Orthoses: Spinal Brace Spinal Brace: Lumbar corset;Applied in sitting position Restrictions Weight Bearing Restrictions: No     Mobility  Bed Mobility Overal bed mobility: Needs Assistance Bed Mobility: Rolling, Sidelying to Sit Rolling: Min guard Sidelying to sit: Min guard       General bed mobility comments: oob on arrival, able to verbally teach back log roll technique    Transfers Overall transfer level: Needs assistance Equipment used: Rolling walker (2 wheels) Transfers: Sit to/from Stand Sit to Stand: Supervision, Min guard           General transfer comment: Supervision-min guard for transfers    Ambulation/Gait Ambulation/Gait assistance: Supervision Gait Distance (Feet): 75 Feet Assistive device: Rolling walker (2 wheels) Gait Pattern/deviations: Step-through pattern, Decreased stride length Gait velocity: decreased     General Gait Details: Slow pace, no overt LOB, supervision for safety   Stairs Stairs: Yes Stairs assistance: Min guard Stair Management: One rail Right, Step to pattern, Forwards Number of Stairs: 5 General stair comments: able to ascend/descend 5 steps with no LOB, mild knee instability but pt able to correct without assist   Wheelchair Mobility    Modified Rankin (Stroke Patients Only)       Balance Overall balance assessment: Mild deficits observed, not formally tested                                          Cognition Arousal/Alertness: Awake/alert Behavior During Therapy: WFL for tasks assessed/performed Overall Cognitive Status: Within Functional Limits for tasks assessed  Exercises General Exercises - Lower Extremity Hip ABduction/ADduction: Both, 10 reps, Standing Hip Flexion/Marching: Both, 10 reps, Standing Heel Raises: Both, 10 reps, Standing Mini-Sqauts: 10 reps, Standing     General Comments General comments (skin integrity, edema, etc.): VSS on RA, HEP provided for standing therex, reviewd and pt able to demonstrate back all exercises      Pertinent Vitals/Pain Pain Assessment Pain Assessment: Faces Faces Pain Scale: Hurts a little bit Pain Location: back Pain Descriptors / Indicators: Operative site guarding Pain Intervention(s): Monitored during session, Limited activity within patient's tolerance    Home Living                          Prior Function            PT Goals (current goals can now be found in the care plan section) Acute Rehab PT Goals Patient Stated Goal: to heal PT Goal Formulation: With patient Time For Goal Achievement: 09/15/21    Frequency    Min 5X/week      PT Plan      Co-evaluation              AM-PAC PT "6 Clicks" Mobility   Outcome Measure  Help needed turning from your back to your side while in a flat bed without using bedrails?: A Little Help needed moving from lying on your back to sitting on the side of a flat bed without using bedrails?: A Little Help needed moving to and from a bed to a chair (including a wheelchair)?: A Little Help needed standing up from a chair using your arms (e.g., wheelchair or bedside chair)?: A Little Help needed to walk in hospital room?: A Little Help needed climbing 3-5 steps with a railing? : A Little 6 Click Score: 18    End of Session Equipment Utilized During Treatment: Back brace Activity Tolerance: Patient tolerated treatment well Patient left: in chair;with call bell/phone within reach Nurse Communication: Mobility status PT Visit Diagnosis: Unsteadiness on feet (R26.81);Difficulty in walking, not elsewhere classified (R26.2);Pain Pain - part of body:  (back)     Time: 9924-2683 PT Time Calculation (min) (ACUTE ONLY): 28 min  Charges:  $Gait Training: 8-22 mins $Therapeutic Exercise: 8-22 mins                     Derrall Hicks R. PTA Acute  Rehabilitation Services Office: [place] 09/03/2021, 9:16 AM

## 2021-09-03 NOTE — Plan of Care (Signed)
°  Problem: Education: °Goal: Knowledge of General Education information will improve °Description: Including pain rating scale, medication(s)/side effects and non-pharmacologic comfort measures °Outcome: Progressing °  °Problem: Coping: °Goal: Level of anxiety will decrease °Outcome: Progressing °  °Problem: Elimination: °Goal: Will not experience complications related to bowel motility °Outcome: Progressing °  °Problem: Pain Managment: °Goal: General experience of comfort will improve °Outcome: Progressing °  °Problem: Safety: °Goal: Ability to remain free from injury will improve °Outcome: Progressing °  °Problem: Skin Integrity: °Goal: Risk for impaired skin integrity will decrease °Outcome: Progressing °  °

## 2021-09-03 NOTE — TOC Transition Note (Incomplete)
Transition of Care [facility]) - CM/SW Discharge Note   Patient Details  Name: Carol Wilkins MRN: [id number] Date of Birth: 04/29/1955  [facility] CM/SW Contact:  Sharin Mons, RN Phone Number: 09/03/2021, 11:35 AM   Clinical Narrative:    Patient will DC to: Anticipated DC date: Family notified: Transport by:   Per MD patient ready for DC to . RN, patient, patient's family, and facility notified of DC. Discharge Summary and FL2 sent to facility. RN to call report prior to discharge (). DC packet on chart. Ambulance transport requested for patient.   RNCM will sign off for now as intervention is no longer needed. Please consult [place] again if new needs arise.   Final next level of care: Home/Self Care Barriers to Discharge: No Barriers Identified   Patient Goals and CMS Choice     Choice offered to / list presented to : Patient  Discharge Placement                       Discharge Plan and Services                DME Arranged: 3-N-1, Walker rolling, Shower stool DME Agency: [employer] Date DME Agency Contacted: 09/01/21 Time DME Agency Contacted: [phone] Representative spoke with at DME Agency: [place] ([place]) Interventions     Readmission Risk Interventions     View : No data to display.

## 2021-09-03 NOTE — Care Management Important Message (Signed)
Important Message  Patient Details  Name: Carol Wilkins MRN: [id number] Date of Birth: 10-16-1955   Medicare Important Message Given:  Yes     Jaymison Luber [address] 09/03/2021, 4:10 PM

## 2021-09-04 LAB — BPAM RBC
Blood Product Expiration Date: 202306132359
Blood Product Expiration Date: 202306132359
Unit Type and Rh: 6200
Unit Type and Rh: 6200

## 2021-09-04 LAB — TYPE AND SCREEN
ABO/RH(D): A POS
Antibody Screen: POSITIVE
DAT, IgG: POSITIVE
Unit division: 0
Unit division: 0

## 2021-09-04 LAB — GLUCOSE, CAPILLARY: Glucose-Capillary: 154 mg/dL — ABNORMAL HIGH (ref 70–99)

## 2021-09-04 NOTE — Progress Notes (Signed)
  Subjective: Patient stable.  Ambulating in room in hall.  Pain is controlled.  Brace in place lower lumbar spine   Objective: Vital signs in last 24 hours: Temp:  [97.7 F (36.5 C)-98.2 F (36.8 C)] 97.7 F (36.5 C) (05/20 0753) Pulse Rate:  [85-93] 85 (05/20 0753) Resp:  [17-18] 17 (05/20 0753) BP: (122-141)/(55-64) 131/57 (05/20 0753) SpO2:  [94 %-97 %] 94 % (05/20 0753)  Intake/Output from previous day: 05/19 0701 - 05/20 0700 In: 236 [P.O.:236] Out: -  Intake/Output this shift: No intake/output data recorded.  Exam:  Dorsiflexion/Plantar flexion intact Compartment soft  Labs: Recent Labs    09/02/21 1357  HGB 8.3*   Recent Labs    09/02/21 1357  WBC 9.2  RBC 3.04*  HCT 26.0*  PLT 178   No results for input(s): NA, K, CL, CO2, BUN, CREATININE, GLUCOSE, CALCIUM in the last 72 hours. No results for input(s): LABPT, INR in the last 72 hours.  Assessment/Plan: Plan at this time is discharged home.  Dr. Louanne Skye did see the patient yesterday and requested discharge.  We will proceed with discharge today and she will follow-up with Dr. Louanne Skye within the next 1 to 2 weeks.   G Scott Akesha Uresti 09/04/2021, 7:59 AM

## 2021-09-04 NOTE — TOC Transition Note (Signed)
Transition of Care [facility]) - CM/SW Discharge Note   Patient Details  Name: Carol Wilkins MRN: [id number] Date of Birth: [birth year]  [facility]) CM/SW Contact:  Bartholomew Crews, RN Phone Number: [phone] 09/04/2021, 8:24 AM   Clinical Narrative:     Spoke with patient on her mobile phone to discuss post acute transition. Confirmed that she has received DME previously arranged by other RNCM. Patient stated that her husband has already taken the DME home. Husband to provided transportation home. Reminded to follow up with orthopedic provider in 1-2 weeks, and to pick up her discharge prescriptions. Patient stated that the pharmacy was out of stock of oxycontin, however, she stated that the gabapentin is helping to manage her pain at this time. No further TOC needs identified at this time.   Final next level of care: Home/Self Care Barriers to Discharge: No Barriers Identified   Patient Goals and CMS Choice Patient states their goals for this hospitalization and ongoing recovery are:: return home with husband CMS Medicare.gov Compare Post Acute Care list provided to:: Patient Choice offered to / list presented to : Patient  Discharge Placement                       Discharge Plan and Services                DME Arranged: 3-N-1, Walker rolling, Shower stool DME Agency: [employer] Date DME Agency Contacted: 09/01/21 Time DME Agency Contacted: [phone] Representative spoke with at DME Agency: [place]: NA [place] Agency: NA        Social Determinants of Health (SDOH) Interventions     Readmission Risk Interventions     View : No data to display.

## 2021-09-07 ENCOUNTER — Telehealth: Payer: Self-pay | Admitting: Specialist

## 2021-09-07 NOTE — Telephone Encounter (Signed)
Pt called and needs a 2 week follow up and she says she needs to see nitka. Can you open a spot and ill call pt.   [phone]

## 2021-09-07 NOTE — Telephone Encounter (Signed)
Please schedule her 2 week postop  with Jeneen Rinks next week.

## 2021-09-09 ENCOUNTER — Ambulatory Visit (INDEPENDENT_AMBULATORY_CARE_PROVIDER_SITE_OTHER): Payer: Medicaid Other | Admitting: Surgery

## 2021-09-09 ENCOUNTER — Encounter: Payer: Self-pay | Admitting: Surgery

## 2021-09-09 ENCOUNTER — Ambulatory Visit: Payer: Self-pay

## 2021-09-09 VITALS — BP 131/78 | HR 92 | Ht 62.0 in | Wt 175.8 lb

## 2021-09-09 DIAGNOSIS — M4316 Spondylolisthesis, lumbar region: Secondary | ICD-10-CM

## 2021-09-09 DIAGNOSIS — Z981 Arthrodesis status: Secondary | ICD-10-CM

## 2021-09-09 NOTE — Progress Notes (Signed)
66 year old black female who is little over a week out from L3-4 and L4-5 fusion returns.  States that she is doing well.  Patient that she was getting home health PT but this was not arranged.  She is also wondering about a possible home health aide to assist when her husband is not home.   Exam Very pleasant female alert and oriented in no acute distress.  Wound looks good.  Staples intact.  No drainage or signs of infection.  She is neurologically intact.    Plan Advised patient that it is too early to take staples out.  Follow with me in 1 week for wound check and likely staple removal at that time.  States that she does not need refill of pain medication.  Continue wearing brace.  Advised patient that we will contact a home health agency to see if it is possible for her to have some therapy and an aide temporarily.

## 2021-09-10 ENCOUNTER — Telehealth: Payer: Self-pay

## 2021-09-10 NOTE — Telephone Encounter (Signed)
Talked with patient per Benjiman Core, PA and advised her that insurance will not approved for home health.  Patient voiced that she understands.

## 2021-09-14 LAB — POCT I-STAT 7, (LYTES, BLD GAS, ICA,H+H)
Acid-Base Excess: 1 mmol/L (ref 0.0–2.0)
Bicarbonate: 24.6 mmol/L (ref 20.0–28.0)
Calcium, Ion: 1.22 mmol/L (ref 1.15–1.40)
HCT: 29 % — ABNORMAL LOW (ref 36.0–46.0)
Hemoglobin: 9.9 g/dL — ABNORMAL LOW (ref 12.0–15.0)
O2 Saturation: 100 %
Potassium: 3.3 mmol/L — ABNORMAL LOW (ref 3.5–5.1)
Sodium: 139 mmol/L (ref 135–145)
TCO2: 26 mmol/L (ref 22–32)
pCO2 arterial: 35.9 mmHg (ref 32–48)
pH, Arterial: 7.444 (ref 7.35–7.45)
pO2, Arterial: 284 mmHg — ABNORMAL HIGH (ref 83–108)

## 2021-09-16 ENCOUNTER — Ambulatory Visit (INDEPENDENT_AMBULATORY_CARE_PROVIDER_SITE_OTHER): Payer: Medicaid Other | Admitting: Surgery

## 2021-09-16 DIAGNOSIS — Z981 Arthrodesis status: Secondary | ICD-10-CM

## 2021-09-16 NOTE — Progress Notes (Signed)
Patient is here to have staples removed. Staples removed and steri strips applied

## 2021-09-18 DIAGNOSIS — M48062 Spinal stenosis, lumbar region with neurogenic claudication: Secondary | ICD-10-CM

## 2021-09-18 DIAGNOSIS — M4316 Spondylolisthesis, lumbar region: Secondary | ICD-10-CM

## 2021-09-21 NOTE — Discharge Summary (Signed)
Patient ID: Carol Wilkins MRN: [id number] DOB/AGE: 67-Oct-1957 67 y.o.  Admit date: 08/31/2021 Discharge date: 09/04/2021  Admission Diagnoses:  Principal Problem:   Fusion of spine of lumbar region Active Problems:   Spinal stenosis, lumbar region with neurogenic claudication   Spondylolisthesis, lumbar region   Discharge Diagnoses:  Principal Problem:   Fusion of spine of lumbar region Active Problems:   Spinal stenosis, lumbar region with neurogenic claudication   Spondylolisthesis, lumbar region  status post Procedure(s): LEFT Lumbar three - lumbar four AND Lumbar four- lumbar five TRANSFORAMINAL LUMBAR INTERBODY FUSION WITH RODS, SCREWS AND CAGES, LOCAL BONE GRAFT, ALLOGRAFT BONE GRAFT AND VIVIGEN  Past Medical History:  Diagnosis Date   Anxiety attack    Arthritis    Diabetes mellitus without complication ([place])    type 2 on metformin   High cholesterol    Hypertension     Surgeries: Procedure(s): LEFT Lumbar three - lumbar four AND Lumbar four- lumbar five TRANSFORAMINAL LUMBAR INTERBODY FUSION WITH RODS, SCREWS AND CAGES, LOCAL BONE GRAFT, ALLOGRAFT BONE GRAFT AND VIVIGEN on 08/31/2021   Consultants:   Discharged Condition: Improved  Hospital Course: Carol Wilkins is an 66 y.o. female who was admitted 08/31/2021 for operative treatment of Fusion of spine of lumbar region. Patient failed conservative treatments (please see the history and physical for the specifics) and had severe unremitting pain that affects sleep, daily activities and work/hobbies. After pre-op clearance, the patient was taken to the operating room on 08/31/2021 and underwent  Procedure(s): LEFT Lumbar three - lumbar four AND Lumbar four- lumbar five TRANSFORAMINAL LUMBAR INTERBODY FUSION WITH RODS, SCREWS AND CAGES, LOCAL BONE GRAFT, ALLOGRAFT BONE GRAFT AND VIVIGEN.    Patient was given perioperative antibiotics:  Anti-infectives (From admission, onward)    Start     Dose/Rate Route  Frequency Ordered Stop   08/31/21 2000  vancomycin (VANCOCIN) IVPB 1000 mg/200 mL premix        1,000 mg 200 mL/hr over 60 Minutes Intravenous  Once 08/31/21 1602 08/31/21 2202   08/31/21 0615  vancomycin (VANCOCIN) IVPB 1000 mg/200 mL premix        1,000 mg 200 mL/hr over 60 Minutes Intravenous On call to O.R. 08/31/21 1761 08/31/21 6073        Patient was given sequential compression devices and early ambulation to prevent DVT.   Patient benefited maximally from hospital stay and there were no complications. At the time of discharge, the patient was urinating/moving their bowels without difficulty, tolerating a regular diet, pain is controlled with oral pain medications and they have been cleared by PT/OT.   Recent vital signs: No data found.   Recent laboratory studies: No results for input(s): WBC, HGB, HCT, PLT, NA, K, CL, CO2, BUN, CREATININE, GLUCOSE, INR, CALCIUM in the last 72 hours.  Invalid input(s): PT, 2   Discharge Medications:   Allergies as of 09/04/2021       Reactions   Penicillins Hives   Has patient had a PCN reaction causing immediate rash, facial/tongue/throat swelling, SOB or lightheadedness with hypotension: Yes Has patient had a PCN reaction causing severe rash involving mucus membranes or skin necrosis: No Has patient had a PCN reaction that required hospitalization No Has patient had a PCN reaction occurring within the last 10 years: No If all of the above answers are "NO", then may proceed with Cephalosporin use.        Medication List     STOP taking these medications  HYDROcodone-acetaminophen 10-325 MG tablet Commonly known as: NORCO       TAKE these medications    acetaminophen 500 MG tablet Commonly known as: TYLENOL Take 2 tablets (1,000 mg total) by mouth every 6 (six) hours as needed.   amLODipine 10 MG tablet Commonly known as: NORVASC Take 0.5 tablets (5 mg total) by mouth daily. What changed: how much to take    atorvastatin 20 MG tablet Commonly known as: LIPITOR Take 1 tablet by mouth daily.   Azilsartan Medoxomil 40 MG Tabs Take 40 mg by mouth daily.   buPROPion 150 MG 12 hr tablet Commonly known as: WELLBUTRIN SR Take 150 mg by mouth daily.   docusate sodium 100 MG capsule Commonly known as: COLACE Take 1 capsule (100 mg total) by mouth 2 (two) times daily.   ferrous gluconate 324 MG tablet Commonly known as: FERGON Take 1 tablet (324 mg total) by mouth 2 (two) times daily with a meal.   fexofenadine 180 MG tablet Commonly known as: ALLEGRA Take 180 mg by mouth daily as needed for allergies or rhinitis.   fluticasone 50 MCG/ACT nasal spray Commonly known as: FLONASE Place 1 spray into both nostrils daily as needed for allergies or rhinitis.   gabapentin 100 MG capsule Commonly known as: NEURONTIN TAKE 1 CAPSULE(100 MG) BY MOUTH AT BEDTIME What changed: Another medication with the same name was added. Make sure you understand how and when to take each.   gabapentin 300 MG capsule Commonly known as: NEURONTIN Take 1 capsule (300 mg total) by mouth 2 (two) times daily. What changed: You were already taking a medication with the same name, and this prescription was added. Make sure you understand how and when to take each.   hydrochlorothiazide 25 MG tablet Commonly known as: HYDRODIURIL Take 25 mg by mouth daily.   metFORMIN 500 MG tablet Commonly known as: GLUCOPHAGE Take 500 mg by mouth daily.   methocarbamol 500 MG tablet Commonly known as: ROBAXIN Take 1 tablet (500 mg total) by mouth every 8 (eight) hours as needed for muscle spasms.   oxyCODONE 10 mg 12 hr tablet Commonly known as: OXYCONTIN Take 1 tablet (10 mg total) by mouth every 12 (twelve) hours.   oxyCODONE 5 MG immediate release tablet Commonly known as: Oxy IR/ROXICODONE Take 1 tablet (5 mg total) by mouth every 3 (three) hours as needed for moderate pain ((score 4 to 6)).   Ozempic (0.25 or 0.5  MG/DOSE) 2 MG/1.5ML Sopn Generic drug: Semaglutide(0.25 or 0.'5MG'$ /DOS) Inject 0.5 mg into the skin every Thursday.   polyethylene glycol 17 g packet Commonly known as: MIRALAX / GLYCOLAX Take 17 g by mouth daily as needed for mild constipation.   Vitamin D (Ergocalciferol) 1.25 MG (50000 UNIT) Caps capsule Commonly known as: DRISDOL Take 50,000 Units by mouth every Wednesday.        Diagnostic Studies: DG Lumbar Spine Complete  Result Date: 08/31/2021 CLINICAL DATA:  L3-L4 and L4-L5 TLIF EXAM: LUMBAR SPINE - COMPLETE 4+ VIEW COMPARISON:  05/06/2021 FINDINGS: Four fluoroscopic images are obtained during the performance of the procedure and are provided for interpretation only. Images demonstrate placement of rods, screws, and interbody disc spacers at what is presumed to be L3-L5, given the morphology of the imaged portion of S1. Fluoroscopy time: 3 minutes, 39 seconds 179.92 mGy IMPRESSION: Intraoperative images of L3-L5 TLIF. Electronically Signed   By: Merilyn Baba M.D.   On: 08/31/2021 13:45   DG C-Arm 1-60 Min-No Report  Result  Date: 08/31/2021 Fluoroscopy was utilized by the requesting physician.  No radiographic interpretation.   DG C-Arm 1-60 Min-No Report  Result Date: 08/31/2021 Fluoroscopy was utilized by the requesting physician.  No radiographic interpretation.   DG C-Arm 1-60 Min-No Report  Result Date: 08/31/2021 Fluoroscopy was utilized by the requesting physician.  No radiographic interpretation.   DG C-Arm 1-60 Min-No Report  Result Date: 08/31/2021 Fluoroscopy was utilized by the requesting physician.  No radiographic interpretation.   DG C-Arm 1-60 Min-No Report  Result Date: 08/31/2021 Fluoroscopy was utilized by the requesting physician.  No radiographic interpretation.   DG C-Arm 1-60 Min-No Report  Result Date: 08/31/2021 Fluoroscopy was utilized by the requesting physician.  No radiographic interpretation.    Discharge Instructions     Call MD /  Call 911   Complete by: As directed    If you experience chest pain or shortness of breath, CALL 911 and be transported to the hospital emergency room.  If you develope a fever above 101 F, pus (white drainage) or increased drainage or redness at the wound, or calf pain, call your surgeon's office.   Call MD / Call 911   Complete by: As directed    If you experience chest pain or shortness of breath, CALL 911 and be transported to the hospital emergency room.  If you develope a fever above 101 F, pus (white drainage) or increased drainage or redness at the wound, or calf pain, call your surgeon's office.   Constipation Prevention   Complete by: As directed    Drink plenty of fluids.  Prune juice may be helpful.  You may use a stool softener, such as Colace (over the counter) 100 mg twice a day.  Use MiraLax (over the counter) for constipation as needed.   Constipation Prevention   Complete by: As directed    Drink plenty of fluids.  Prune juice may be helpful.  You may use a stool softener, such as Colace (over the counter) 100 mg twice a day.  Use MiraLax (over the counter) for constipation as needed.   Diet - low sodium heart healthy   Complete by: As directed    Diet - low sodium heart healthy   Complete by: As directed    Discharge instructions   Complete by: As directed    Call if there is increasing drainage, fever greater than 101.5, severe head aches, and worsening nausea or light sensitivity. If shortness of breath, bloody cough or chest tightness or pain go to an emergency room. No lifting greater than 10 lbs. Avoid bending, stooping and twisting. Use brace when sitting and out of bed even to go to bathroom. Walk in house for first 2 weeks then may start to get out slowly increasing distances up to one quarter mile by 4-6 weeks post op. After 5 days may shower and change dressing following bathing with shower.When bathing remove the brace shower and replace brace before getting  out of the shower. If drainage, keep dry dressing and do not bathe the incision, use an moisture impervious dressing. Please call and return for scheduled follow up appointment 2 weeks from the time of surgery.  Take 5 mg of norvasc daily instead of the usual 10 mg daily due to anemia. Make an appointment to see your primary care MD for follow up of hypertension as it is not high now and your meds may need adjustment.   Driving restrictions   Complete by: As directed  No driving for 6 weeks   Increase activity slowly as tolerated   Complete by: As directed    Increase activity slowly as tolerated   Complete by: As directed    Lifting restrictions   Complete by: As directed    No lifting for 12 weeks   Post-operative opioid taper instructions:   Complete by: As directed    POST-OPERATIVE OPIOID TAPER INSTRUCTIONS: It is important to wean off of your opioid medication as soon as possible. If you do not need pain medication after your surgery it is ok to stop day one. Opioids include: Codeine, Hydrocodone(Norco, Vicodin), Oxycodone(Percocet, oxycontin) and hydromorphone amongst others.  Long term and even short term use of opiods can cause: Increased pain response Dependence Constipation Depression Respiratory depression And more.  Withdrawal symptoms can include Flu like symptoms Nausea, vomiting And more Techniques to manage these symptoms Hydrate well Eat regular healthy meals Stay active Use relaxation techniques(deep breathing, meditating, yoga) Do Not substitute Alcohol to help with tapering If you have been on opioids for less than two weeks and do not have pain than it is ok to stop all together.  Plan to wean off of opioids This plan should start within one week post op of your joint replacement. Maintain the same interval or time between taking each dose and first decrease the dose.  Cut the total daily intake of opioids by one tablet each day Next start to  increase the time between doses. The last dose that should be eliminated is the evening dose.      Post-operative opioid taper instructions:   Complete by: As directed    POST-OPERATIVE OPIOID TAPER INSTRUCTIONS: It is important to wean off of your opioid medication as soon as possible. If you do not need pain medication after your surgery it is ok to stop day one. Opioids include: Codeine, Hydrocodone(Norco, Vicodin), Oxycodone(Percocet, oxycontin) and hydromorphone amongst others.  Long term and even short term use of opiods can cause: Increased pain response Dependence Constipation Depression Respiratory depression And more.  Withdrawal symptoms can include Flu like symptoms Nausea, vomiting And more Techniques to manage these symptoms Hydrate well Eat regular healthy meals Stay active Use relaxation techniques(deep breathing, meditating, yoga) Do Not substitute Alcohol to help with tapering If you have been on opioids for less than two weeks and do not have pain than it is ok to stop all together.  Plan to wean off of opioids This plan should start within one week post op of your joint replacement. Maintain the same interval or time between taking each dose and first decrease the dose.  Cut the total daily intake of opioids by one tablet each day Next start to increase the time between doses. The last dose that should be eliminated is the evening dose.           Follow-up Information     Jessy Oto, MD Follow up in 2 week(s).   Specialty: Orthopedic Surgery Why: For wound re-check Contact information: [place] [address] [phone]         Roselee Nova, MD Follow up in 2 week(s).   Specialty: Family Medicine Why: .  Take 5 mg of norvasc daily instead of the usual 10 mg daily due to anemia. Make an appointment to see your primary care MD for follow up of hypertension as it is not high now and your meds may need  adjustment. Contact information: [place]  [address] [phone]                 Discharge Plan:  discharge to home  Disposition:     Signed: Benjiman Core  09/21/2021, 9:27 AM

## 2021-09-22 ENCOUNTER — Telehealth: Payer: Self-pay

## 2021-09-22 NOTE — Telephone Encounter (Signed)
Patient called stating that she is having lower back pain that is radiating around to her stomach and her abdomen.  Stated that it feels like something is pulling. Patient had Lumbar Fusion on 08/31/2021.  Cb# [phone].  Please advise.  Thank you.

## 2021-09-22 NOTE — Telephone Encounter (Signed)
I called and tried to schedule her with Jeneen Rinks and she did not want an appointment. States that she does not have any one there to help bath her or help her get dressed. That it was to short of notice, was going to put her on the schedule to see Jeneen Rinks 09/23/21 @ 1:45 pm

## 2021-09-27 ENCOUNTER — Telehealth: Payer: Self-pay | Admitting: Specialist

## 2021-09-27 NOTE — Telephone Encounter (Signed)
Patient called asked if she can get on the cancellation list for a sooner appointment? Patient said she is in a lot of pain.  The number to contact patient is [phone]

## 2021-09-27 NOTE — Telephone Encounter (Signed)
I have put her on the cancellation list and I will call if something opens sooner

## 2021-10-14 ENCOUNTER — Ambulatory Visit: Payer: Self-pay

## 2021-10-14 ENCOUNTER — Ambulatory Visit (INDEPENDENT_AMBULATORY_CARE_PROVIDER_SITE_OTHER): Payer: Medicare Other | Admitting: Specialist

## 2021-10-14 ENCOUNTER — Encounter: Payer: Self-pay | Admitting: Specialist

## 2021-10-14 VITALS — BP 132/78 | HR 98 | Ht 62.0 in | Wt 175.0 lb

## 2021-10-14 DIAGNOSIS — Z981 Arthrodesis status: Secondary | ICD-10-CM

## 2021-10-14 MED ORDER — PREGABALIN 75 MG PO CAPS: 75.0000 mg | ORAL_CAPSULE | Freq: Two times a day (BID) | ORAL | 0 refills | Status: AC

## 2021-10-14 NOTE — Progress Notes (Signed)
Post-Op Visit Note   Patient: Carol Wilkins           Date of Birth: 01/26/1956           MRN: [id number] Visit Date: 10/14/2021 PCP: Roselee Nova, MD   Assessment & Plan: 6 weeks post left L3-4 and L4-5 TLIFs with persistent left leg pain and weakness. Collapsing degen scoliosis.   Chief Complaint:  Chief Complaint  Patient presents with   Lower Back - Follow-up    L3-4, L4-5 fusion 08/31/2021   Visit Diagnoses:  1. S/P lumbar fusion   Incision is healed She has weakness left thigh and left foot DF PT would help and  Help with ADLs is recommended.   Plan: Avoid bending, stooping and avoid lifting weights greater than 10 lbs. Mobilize with standing and walking. Avoid frequent bending and stooping   Home health for ADL and for strengthening as you are home bound still till able to drive. May use ice or moist heat for pain. Weight loss is of benefit. Handicap license is approved.   Follow-Up Instructions: No follow-ups on file.   Orders:  Orders Placed This Encounter  Procedures   XR Lumbar Spine 2-3 Views   No orders of the defined types were placed in this encounter.   Imaging: No results found.  PMFS History: Patient Active Problem List   Diagnosis Date Noted   Spinal stenosis, lumbar region with neurogenic claudication    Spondylolisthesis, lumbar region    Fusion of spine of lumbar region 08/31/2021   Colon cancer screening 09/09/2020   Constipation 09/09/2020   Flatulence, eructation and gas pain 09/09/2020   Irritable bowel syndrome 09/09/2020   Personal history of colonic polyps 09/09/2020   Rectal bleeding 09/09/2020   Pes planus 09/09/2020   Posterior tibial tendon dysfunction (PTTD) of both lower extremities 09/09/2020   Hav (hallux abducto valgus), unspecified laterality 09/09/2020   Capsulitis 09/09/2020   Low back pain 01/25/2012   Abdominal pain 11/02/2011   Obesity (BMI 30.0-34.9) 10/31/2011   Post herpetic neuralgia  09/14/2011   DEPRESSION 12/22/2006   HYPERTENSION 12/22/2006   ALLERGIC RHINITIS 12/22/2006   PALPITATIONS, HX OF 12/22/2006   Past Medical History:  Diagnosis Date   Anxiety attack    Arthritis    Diabetes mellitus without complication ([place])    type 2 on metformin   High cholesterol    Hypertension     Family History  Problem Relation Age of Onset   Heart disease Mother    Diabetes Mother    Hypertension Mother    Diabetes Father    Hypertension Father    Diabetes Sister    Hypertension Sister    Heart disease Sister    Diabetes Brother    Hypertension Brother    Heart disease Brother     Past Surgical History:  Procedure Laterality Date   ABDOMINAL HYSTERECTOMY     in early 17's   CHOLECYSTECTOMY     in the 94's   TEE WITHOUT CARDIOVERSION N/A 07/14/2015   Procedure: TRANSESOPHAGEAL ECHOCARDIOGRAM (TEE);  Surgeon: Adrian Prows, MD;  Location: [facility];  Service: Cardiovascular;  Laterality: N/A;   [place]   Social History   Occupational History   Not on file  Tobacco Use   Smoking status: Former    Types: Cigarettes    Quit date: 09/13/1980    Years since quitting: 41.1   Smokeless tobacco: Never  Vaping Use   Vaping Use: Never used  Substance and Sexual Activity   Alcohol use: No   Drug use: No   Sexual activity: Yes    Partners: Male    Birth control/protection: Surgical

## 2021-10-14 NOTE — Patient Instructions (Signed)
Plan:  Hemp CBD capsules, amazon.com 5,000-7,000 mg per bottle, 60 capsules per bottle, take one capsule twice a day. Cane in the left hand to use with left leg weight bearing. Follow-Up Instructions: No follow-ups on file.

## 2021-10-20 ENCOUNTER — Ambulatory Visit: Payer: Medicare Other | Admitting: Podiatry

## 2021-10-21 ENCOUNTER — Telehealth: Payer: Self-pay | Admitting: Specialist

## 2021-10-21 NOTE — Telephone Encounter (Signed)
Pt states that [facility] is having Staffing issue-- they cant assist the pt at this time   Please send referral to someone else

## 2021-10-25 NOTE — Telephone Encounter (Signed)
Please call her back. She states that she had surgery on the 15th but has not gotten started on therapy.

## 2021-10-26 ENCOUNTER — Telehealth: Payer: Self-pay | Admitting: Specialist

## 2021-10-26 NOTE — Telephone Encounter (Signed)
Patient called in again about home health care no one has answered him

## 2021-10-26 NOTE — Telephone Encounter (Signed)
Pt called requesting a call back when Dr. Louanne Skye get in to office today. Pt states she need home health. Pt states she called yesterday and really need to talk to Dr. Louanne Skye about this matter. Pt phone number is [phone].

## 2021-10-28 ENCOUNTER — Telehealth: Payer: Self-pay | Admitting: Specialist

## 2021-10-28 NOTE — Telephone Encounter (Signed)
I called and lmom advised that [facility] would not be able to come out now, that I could refer her to Outpatient PT at [facility]. I asked that she call me back and let me know if this is something she would like to try

## 2021-10-28 NOTE — Telephone Encounter (Signed)
Spoke with patient and advised her that per [facility] no one can come out to her home for HHPT. Patient asked if she could be put on the waiting list to see Dr. Louanne Skye because she have a hard time getting up out of the bed. Patient said she is in a lot of pain. Patient said she do not want to wait until 11/15/2021 to see Dr.Nitka. The number to contact patient is [phone]

## 2021-10-28 NOTE — Telephone Encounter (Signed)
I have added her to the cancellation list

## 2021-10-28 NOTE — Telephone Encounter (Signed)
I called and lmom advised that [facility] would not be able to come out now, that I could refer her to Outpatient PT at [facility]. I asked that she call me back and let me know if this is something she would like to try

## 2021-11-01 ENCOUNTER — Telehealth: Payer: Self-pay

## 2021-11-01 NOTE — Telephone Encounter (Signed)
Patient left voice mail requesting return call regarding communication about her care.  Please call.  Thanks! [phone]

## 2021-11-01 NOTE — Telephone Encounter (Signed)
I called and spoke with patient and I again advised that since she is over 2 months postop-that her insurance would not cover HHPT, that she would need to go to Outpatient PT. She states that she has not been released to drive yet by Dr. Louanne Skye and that she doesn't see I'm again for 2 weeks.  The only thing she is taking for pain in [place] at this time. She states that she is not comfortable getting out by herself that she as she is not very steady even with a walker. I advised that I would speak with Dr. Louanne Skye about allowing her to drive the 15 minutes it would take her to get PT on [facility], and I advised that it would probably be Wednesday before I get to speak with him since he is in surgery tomorrow. She states that she understands and she will contact her insurance and see if they offer any kind of transportation benefits. And we will get back together and discuss this on Wendesday.

## 2021-11-03 NOTE — Telephone Encounter (Signed)
Per Dr. Louanne Skye She can drive now for a short distance.

## 2021-11-03 NOTE — Telephone Encounter (Signed)
I called and advised she could drive-----place order for PT

## 2021-11-04 ENCOUNTER — Other Ambulatory Visit: Payer: Self-pay | Admitting: Radiology

## 2021-11-04 DIAGNOSIS — Z981 Arthrodesis status: Secondary | ICD-10-CM

## 2021-11-04 DIAGNOSIS — M4316 Spondylolisthesis, lumbar region: Secondary | ICD-10-CM

## 2021-11-04 DIAGNOSIS — M5416 Radiculopathy, lumbar region: Secondary | ICD-10-CM

## 2021-11-10 ENCOUNTER — Ambulatory Visit (INDEPENDENT_AMBULATORY_CARE_PROVIDER_SITE_OTHER): Payer: Medicare Other | Admitting: Specialist

## 2021-11-10 ENCOUNTER — Other Ambulatory Visit: Payer: Self-pay | Admitting: Specialist

## 2021-11-10 ENCOUNTER — Encounter: Payer: Self-pay | Admitting: Specialist

## 2021-11-10 ENCOUNTER — Ambulatory Visit: Payer: Medicare Other

## 2021-11-10 VITALS — BP 148/73 | HR 66 | Ht 62.0 in | Wt 175.0 lb

## 2021-11-10 DIAGNOSIS — Z981 Arthrodesis status: Secondary | ICD-10-CM

## 2021-11-10 NOTE — Progress Notes (Signed)
Post-Op Visit Note   Patient: Carol Wilkins           Date of Birth: 10-06-1955           MRN: [id number] Visit Date: 11/10/2021 PCP: Roselee Nova, MD   Assessment & Plan:2 months post op L3-4 and L4-5 TLIFs for Spondylolisthesis and spondylosis  Chief Complaint:  Chief Complaint  Patient presents with   Lower Back - Routine Post Op  Incision is healed Motor is intack and normal PT is going to help work with her for LE strengthening   Visit Diagnoses:  1. S/P lumbar fusion     Plan:  Avoid bending, stooping and twisting. Use brace when sitting and out of bed even to go to bathroom. Go to using a walking cane When bathing remove the brace shower and replace brace before getting out of the shower. PT for core and LE strengthening.      Follow-Up Instructions: No follow-ups on file.   Orders:  Orders Placed This Encounter  Procedures   XR Lumbar Spine 2-3 Views   No orders of the defined types were placed in this encounter.   Imaging: No results found.  PMFS History: Patient Active Problem List   Diagnosis Date Noted   Spinal stenosis, lumbar region with neurogenic claudication    Spondylolisthesis, lumbar region    Fusion of spine of lumbar region 08/31/2021   Colon cancer screening 09/09/2020   Constipation 09/09/2020   Flatulence, eructation and gas pain 09/09/2020   Irritable bowel syndrome 09/09/2020   Personal history of colonic polyps 09/09/2020   Rectal bleeding 09/09/2020   Pes planus 09/09/2020   Posterior tibial tendon dysfunction (PTTD) of both lower extremities 09/09/2020   Hav (hallux abducto valgus), unspecified laterality 09/09/2020   Capsulitis 09/09/2020   Low back pain 01/25/2012   Abdominal pain 11/02/2011   Obesity (BMI 30.0-34.9) 10/31/2011   Post herpetic neuralgia 09/14/2011   DEPRESSION 12/22/2006   HYPERTENSION 12/22/2006   ALLERGIC RHINITIS 12/22/2006   PALPITATIONS, HX OF 12/22/2006   Past Medical History:   Diagnosis Date   Anxiety attack    Arthritis    Diabetes mellitus without complication ([place])    type 2 on metformin   High cholesterol    Hypertension     Family History  Problem Relation Age of Onset   Heart disease Mother    Diabetes Mother    Hypertension Mother    Diabetes Father    Hypertension Father    Diabetes Sister    Hypertension Sister    Heart disease Sister    Diabetes Brother    Hypertension Brother    Heart disease Brother     Past Surgical History:  Procedure Laterality Date   ABDOMINAL HYSTERECTOMY     in early 28's   CHOLECYSTECTOMY     in the 34's   TEE WITHOUT CARDIOVERSION N/A 07/14/2015   Procedure: TRANSESOPHAGEAL ECHOCARDIOGRAM (TEE);  Surgeon: Adrian Prows, MD;  Location: [facility];  Service: Cardiovascular;  Laterality: N/A;   [place]   Social History   Occupational History   Not on file  Tobacco Use   Smoking status: Former    Types: Cigarettes    Quit date: 09/13/1980    Years since quitting: 41.1   Smokeless tobacco: Never  Vaping Use   Vaping Use: Never used  Substance and Sexual Activity   Alcohol use: No   Drug use:  No   Sexual activity: Yes    Partners: Male    Birth control/protection: Surgical

## 2021-11-10 NOTE — Patient Instructions (Signed)
Avoid bending, stooping and twisting. Use brace when sitting and out of bed even to go to bathroom. Go to using a walking cane When bathing remove the brace shower and replace brace before getting out of the shower. PT for core and LE strengthening.

## 2021-11-11 ENCOUNTER — Ambulatory Visit: Payer: Medicare Other | Attending: Specialist

## 2021-11-11 DIAGNOSIS — Z981 Arthrodesis status: Secondary | ICD-10-CM | POA: Diagnosis not present

## 2021-11-11 DIAGNOSIS — M5459 Other low back pain: Secondary | ICD-10-CM | POA: Diagnosis present

## 2021-11-11 DIAGNOSIS — M4316 Spondylolisthesis, lumbar region: Secondary | ICD-10-CM | POA: Insufficient documentation

## 2021-11-11 DIAGNOSIS — M5416 Radiculopathy, lumbar region: Secondary | ICD-10-CM | POA: Insufficient documentation

## 2021-11-11 DIAGNOSIS — M6281 Muscle weakness (generalized): Secondary | ICD-10-CM | POA: Diagnosis present

## 2021-11-11 DIAGNOSIS — R2689 Other abnormalities of gait and mobility: Secondary | ICD-10-CM | POA: Insufficient documentation

## 2021-11-11 NOTE — Therapy (Signed)
OUTPATIENT PHYSICAL THERAPY THORACOLUMBAR EVALUATION   Patient Name: Carol Wilkins MRN: [id number] DOB:[birth year], 66 y.o., female Today's Date: 11/11/2021   PT End of Session - 11/11/21 0932     Visit Number 1    Number of Visits 17    Date for PT Re-Evaluation 01/08/22    Authorization Type UHC MCR    Progress Note Due on Visit 10    PT Start Time 0932    PT Stop Time 1015    PT Time Calculation (min) 43 min    Equipment Utilized During Treatment Other (comment)   back brace; RW   Activity Tolerance Patient tolerated treatment well    Behavior During Therapy WFL for tasks assessed/performed             Past Medical History:  Diagnosis Date   Anxiety attack    Arthritis    Diabetes mellitus without complication ([place])    type 2 on metformin   High cholesterol    Hypertension    Past Surgical History:  Procedure Laterality Date   ABDOMINAL HYSTERECTOMY     in early 69's   CHOLECYSTECTOMY     in the 90's   TEE WITHOUT CARDIOVERSION N/A 07/14/2015   Procedure: TRANSESOPHAGEAL ECHOCARDIOGRAM (TEE);  Surgeon: Adrian Prows, MD;  Location: [facility];  Service: Cardiovascular;  Laterality: N/A;   [place]   Patient Active Problem List   Diagnosis Date Noted   Spinal stenosis, lumbar region with neurogenic claudication    Spondylolisthesis, lumbar region    Fusion of spine of lumbar region 08/31/2021   Colon cancer screening 09/09/2020   Constipation 09/09/2020   Flatulence, eructation and gas pain 09/09/2020   Irritable bowel syndrome 09/09/2020   Personal history of colonic polyps 09/09/2020   Rectal bleeding 09/09/2020   Pes planus 09/09/2020   Posterior tibial tendon dysfunction (PTTD) of both lower extremities 09/09/2020   Hav (hallux abducto valgus), unspecified laterality 09/09/2020   Capsulitis 09/09/2020   Low back pain 01/25/2012   Abdominal pain 11/02/2011   Obesity (BMI 30.0-34.9) 10/31/2011   Post herpetic  neuralgia 09/14/2011   DEPRESSION 12/22/2006   HYPERTENSION 12/22/2006   ALLERGIC RHINITIS 12/22/2006   PALPITATIONS, HX OF 12/22/2006    PCP: Roselee Nova  REFERRING PROVIDER: Jessy Oto, MD  REFERRING DIAG: Z98.1 (ICD-10-CM) - S/P lumbar fusion M43.16 (ICD-10-CM) - Spondylolisthesis at L3-L4 level M54.16 (ICD-10-CM) - Radiculopathy, lumbar region   Rationale for Evaluation and Treatment Rehabilitation  THERAPY DIAG:  Muscle weakness (generalized)  Other abnormalities of gait and mobility  Other low back pain  ONSET DATE: 08/31/21  SUBJECTIVE:  SUBJECTIVE STATEMENT: Patient is 2 months s/p L3-4 and L4-5 TLIF reporting improvements in her overall pain and function since the surgery. She is wearing a back brace and was instructed to continue to wear this until she has f/u with surgeon next month. She was instructed on no lifting, bending, twisting until this f/u as well. She is currently using a RW for ambulation, but prior to the surgery she was not using an AD. She reports having a hard time with bed mobility. She has difficulty with household tasks due to restrictions as well as limited tolerance to standing (reports she can only stand for 3-5 minutes currently). She also reports bilateral hip pain attributed to arthritis. She denies any numbness or tingling or changes in bowel/bladder.   PERTINENT HISTORY:  s/p L3-4 and L4-5 TLIF 08/31/21  PAIN:  Are you having pain? Yes: NPRS scale: 5/10 Pain location: bilateral hips Pain description: sore Aggravating factors: standing Relieving factors: rest   PRECAUTIONS: Back Avoid bending, stooping and twisting.     WEIGHT BEARING RESTRICTIONS No  FALLS:  Has patient fallen in last 6 months? No  LIVING ENVIRONMENT: Lives with: lives with  their spouse Lives in: House/apartment Stairs: Yes: External: 5 steps; can reach both Has following equipment at home: Single point cane, Walker - 2 wheeled, shower chair, and bed side commode  OCCUPATION: retired   PLOF: Independent; assistance with bathing and cooking currently.   PATIENT GOALS I want to be able to the things I used to do. Walk and get around at the store, get my nails and hair done. Enjoy my grandkids.    OBJECTIVE:   DIAGNOSTIC FINDINGS:  Recent imaging still under review   PATIENT SURVEYS:  FOTO 41%  SCREENING FOR RED FLAGS: Bowel or bladder incontinence: No Spinal tumors: No Cauda equina syndrome: No Compression fracture: No Abdominal aneurysm: No  COGNITION:  Overall cognitive status: Within functional limits for tasks assessed     SENSATION: Not tested  MUSCLE LENGTH: N/A  POSTURE: rounded shoulders and forward head  PALPATION: N/A  LUMBAR ROM:  Deferred due to post-op acuity and current ROM restrictions  Active  A/PROM  eval  Flexion   Extension   Right lateral flexion   Left lateral flexion   Right rotation   Left rotation    (Blank rows = not tested)    LOWER EXTREMITY MMT:    MMT Right eval Left eval  Hip flexion 3+ 4-  Hip extension    Hip abduction 3- 3-  Hip adduction    Hip internal rotation    Hip external rotation    Knee flexion 5 5  Knee extension 5 5  Ankle dorsiflexion 4 4  Ankle plantarflexion    Ankle inversion    Ankle eversion     (Blank rows = not tested)  LUMBAR SPECIAL TESTS:  N/A  FUNCTIONAL TESTS:    SLS: unable 5 X STS: 28.3 seconds with BUE support TUG: 35 seconds   Sit <> stand: requires cues for proper hand placement (continues to put hands on RW) Sit to supine: Min A  Supine to Sit: Mod A GAIT: Distance walked: 20 ft  Assistive device utilized: Environmental consultant - 2 wheeled Level of assistance: Modified independence Comments: slow gait speed, no push off, decreased step  length    TODAY'S TREATMENT  [facility] Adult PT Treatment:  DATE: 11/11/21 Therapeutic Exercise: Demonstrated and issue initial HEP.    Therapeutic Activity: Log roll technique and proper technique for completing supine <>sit with handout provided  Proper hand placement for sit to stand transfer  Education on assessment findings that will be addressed throughout duration of POC.      PATIENT EDUCATION:  Education details: see treatment  Person educated: Patient Education method: Explanation, Demonstration, Tactile cues, Verbal cues, and Handouts Education comprehension: verbalized understanding, returned demonstration, verbal cues required, tactile cues required, and needs further education   HOME EXERCISE PROGRAM: Access Code: [email or web address] URL: [email or web address] Date: 11/11/2021 Prepared by: Gwendolyn Grant  Exercises - Seated March  - 2 x daily - 7 x weekly - 2 sets - 10 reps - Seated Long Arc Quad  - 2 x daily - 7 x weekly - 2 sets - 10 reps - Heel Raises with Counter Support  - 2 x daily - 7 x weekly - 2 sets - 10 reps  ASSESSMENT:  CLINICAL IMPRESSION: Patient is a 66 y.o. female who was seen today for physical therapy evaluation and treatment for s/p L3-4 and L4-5 TLIF on 08/31/21. She is noted to have strength, ROM, gait, and balance impairments as well as a decrease in functional mobility that are consistent with her recent post-operative status. She will benefit from skilled PT to address the above stated deficits in order to optimize her function and maximize her independence.    OBJECTIVE IMPAIRMENTS Abnormal gait, decreased activity tolerance, decreased balance, decreased endurance, decreased knowledge of use of DME, decreased mobility, difficulty walking, decreased ROM, decreased strength, improper body mechanics, postural dysfunction, and pain.   ACTIVITY LIMITATIONS carrying, lifting, bending, sitting,  standing, squatting, stairs, transfers, bed mobility, bathing, toileting, and locomotion level  PARTICIPATION LIMITATIONS: meal prep, cleaning, laundry, driving, shopping, community activity, and yard work  PERSONAL FACTORS Age and Fitness are also affecting patient's functional outcome.   REHAB POTENTIAL: Good  CLINICAL DECISION MAKING: Stable/uncomplicated  EVALUATION COMPLEXITY: Low   GOALS: Goals reviewed with patient? Yes  SHORT TERM GOALS: Target date: 12/09/2021  Patient will require no cues for proper hand placement when performing sit to stand to improve safety with transfers.  Baseline: constant cues to not push from the RW Goal status: INITIAL  2.  Patient will be Mod I with supine <>sit transfer.  Baseline:  Goal status: INITIAL  3.  Patient will maintain SLS for at least 2 seconds bilaterally to improve gait stability.  Baseline:  Goal status: INITIAL  4.  Patient will complete TUG in </=25 seconds to signify a reduction in fall risk  Baseline:  Goal status: INITIAL   LONG TERM GOALS: Target date: 12/09/2021  Patient will demonstrate proper bending and lifting mechanics to reduce stress on her back.  Baseline: unable  Goal status: INITIAL  2.  Patient will ambulate community and household distances with LRAD.  Baseline:  Goal status: INITIAL  3.  Patient will complete 5 x STS in </= 20 seconds to improve her functional strength.   Baseline:  Goal status: INITIAL  4.  Patient will demonstrate 4/5 hip flexor strength to improve ability to negotiate stairs and curbs. Baseline: see above  Goal status: INITIAL  5.  Patient will demonstrate at least 4-/5 bilateral hip abductor strength to improve stability about the chain with walking activity.  Baseline: see above  Goal status: INITIAL  6.  Patient will tolerate at least 30 minutes of standing activity in order to cook for her  family.  Baseline: see above  Goal status: INITIAL   7. Patient will score  at least 57% on FOTO to signify clinically meaningful improvement in functional abilities.    Baseline: see above    Goal status: initial   PLAN: PT FREQUENCY: 2x/week  PT DURATION: 8 weeks  PLANNED INTERVENTIONS: Therapeutic exercises, Therapeutic activity, Neuromuscular re-education, Balance training, Gait training, Patient/Family education, Self Care, Stair training, DME instructions, Cryotherapy, Moist heat, Manual therapy, and Re-evaluation.  PLAN FOR NEXT SESSION: transfers, LE strengthening, gait training, review and update HEP PRN.   Gwendolyn Grant, PT, DPT, ATC 11/11/21 1:35 PM

## 2021-11-11 NOTE — Patient Instructions (Signed)

## 2021-11-15 ENCOUNTER — Ambulatory Visit: Payer: Medicare Other | Admitting: Specialist

## 2021-11-15 ENCOUNTER — Ambulatory Visit: Payer: Medicare Other

## 2021-11-15 DIAGNOSIS — M6281 Muscle weakness (generalized): Secondary | ICD-10-CM

## 2021-11-15 DIAGNOSIS — M5459 Other low back pain: Secondary | ICD-10-CM

## 2021-11-15 DIAGNOSIS — R2689 Other abnormalities of gait and mobility: Secondary | ICD-10-CM

## 2021-11-15 NOTE — Therapy (Signed)
OUTPATIENT PHYSICAL THERAPY TREATMENT NOTE   Patient Name: Carol Wilkins MRN: [id number] DOB:03/19/1956, 66 y.o., female Today's Date: 11/15/2021  PCP: Roselee Nova REFERRING PROVIDER: Jessy Oto, MD  END OF SESSION:   PT End of Session - 11/15/21 0825     Visit Number 2    Number of Visits 17    Date for PT Re-Evaluation 01/08/22    Authorization Type UHC MCR    Progress Note Due on Visit 10    PT Start Time 0842    PT Stop Time 0924    PT Time Calculation (min) 42 min    Activity Tolerance Patient tolerated treatment well    Behavior During Therapy WFL for tasks assessed/performed             Past Medical History:  Diagnosis Date   Anxiety attack    Arthritis    Diabetes mellitus without complication ([place])    type 2 on metformin   High cholesterol    Hypertension    Past Surgical History:  Procedure Laterality Date   ABDOMINAL HYSTERECTOMY     in early 20's   CHOLECYSTECTOMY     in the 90's   TEE WITHOUT CARDIOVERSION N/A 07/14/2015   Procedure: TRANSESOPHAGEAL ECHOCARDIOGRAM (TEE);  Surgeon: Adrian Prows, MD;  Location: [facility];  Service: Cardiovascular;  Laterality: N/A;   [place]   Patient Active Problem List   Diagnosis Date Noted   Spinal stenosis, lumbar region with neurogenic claudication    Spondylolisthesis, lumbar region    Fusion of spine of lumbar region 08/31/2021   Colon cancer screening 09/09/2020   Constipation 09/09/2020   Flatulence, eructation and gas pain 09/09/2020   Irritable bowel syndrome 09/09/2020   Personal history of colonic polyps 09/09/2020   Rectal bleeding 09/09/2020   Pes planus 09/09/2020   Posterior tibial tendon dysfunction (PTTD) of both lower extremities 09/09/2020   Hav (hallux abducto valgus), unspecified laterality 09/09/2020   Capsulitis 09/09/2020   Low back pain 01/25/2012   Abdominal pain 11/02/2011   Obesity (BMI 30.0-34.9) 10/31/2011   Post herpetic  neuralgia 09/14/2011   DEPRESSION 12/22/2006   HYPERTENSION 12/22/2006   ALLERGIC RHINITIS 12/22/2006   PALPITATIONS, HX OF 12/22/2006    REFERRING DIAG: Z98.1 (ICD-10-CM) - S/P lumbar fusion M43.16 (ICD-10-CM) - Spondylolisthesis at L3-L4 level M54.16 (ICD-10-CM) - Radiculopathy, lumbar region   THERAPY DIAG:  Muscle weakness (generalized)  Other abnormalities of gait and mobility  Other low back pain  Rationale for Evaluation and Treatment Rehabilitation  PERTINENT HISTORY: s/p L3-4 and L4-5 TLIF 08/31/21  PRECAUTIONS: Back Avoid bending, stooping and twisting.  SUBJECTIVE: patient reports her back is feeling ok without pain currently. She reports compliance with HEP, but still has difficulty with transfers that we went over last week. She brought her cane today, but hasn't used it before now.   PAIN:  Are you having pain? None currently; At worst Yes: NPRS scale: 6/10 Pain location: Bilateral hips (Rt>Lt) Pain description: sharp Aggravating factors: laying down Relieving factors: movement    OBJECTIVE: (objective measures completed at initial evaluation unless otherwise dated)  DIAGNOSTIC FINDINGS:  Recent imaging still under review    PATIENT SURVEYS:  FOTO 41%   SCREENING FOR RED FLAGS: Bowel or bladder incontinence: No Spinal tumors: No Cauda equina syndrome: No Compression fracture: No Abdominal aneurysm: No   COGNITION:           Overall cognitive  status: Within functional limits for tasks assessed                          SENSATION: Not tested   MUSCLE LENGTH: N/A   POSTURE: rounded shoulders and forward head   PALPATION: N/A   LUMBAR ROM:  Deferred due to post-op acuity and current ROM restrictions  Active  A/PROM  eval  Flexion    Extension    Right lateral flexion    Left lateral flexion    Right rotation    Left rotation     (Blank rows = not tested)       LOWER EXTREMITY MMT:     MMT Right eval Left eval  Hip flexion 3+ 4-   Hip extension      Hip abduction 3- 3-  Hip adduction      Hip internal rotation      Hip external rotation      Knee flexion 5 5  Knee extension 5 5  Ankle dorsiflexion 4 4  Ankle plantarflexion      Ankle inversion      Ankle eversion       (Blank rows = not tested)   LUMBAR SPECIAL TESTS:  N/A   FUNCTIONAL TESTS:                        SLS: unable 5 X STS: 28.3 seconds with BUE support TUG: 35 seconds    Sit <> stand: requires cues for proper hand placement (continues to put hands on RW) Sit to supine: Min A  Supine to Sit: Mod A GAIT: Distance walked: 20 ft  Assistive device utilized: Environmental consultant - 2 wheeled Level of assistance: Modified independence Comments: slow gait speed, no push off, decreased step length       TODAY'S TREATMENT  [facility] Adult PT Treatment:                                                DATE: 11/15/21 Therapeutic Exercise: NuStep level 4 x 5 minutes  Therapeutic Activity: Log roll technique and proper technique for completing supine <>sit on each side of mat; multiple trials  Bed mobility working on bridging to change position  Gait training: With [facility] working on sequencing of step to and step through pattern  Stair negotiation with SPC focusing on step to pattern with ascent and descent Handout on stair negotiation with [facility]  [facility] Adult PT Treatment:                                                DATE: 11/11/21 Therapeutic Exercise: Demonstrated and issue initial HEP.      Therapeutic Activity: Log roll technique and proper technique for completing supine <>sit with handout provided  Proper hand placement for sit to stand transfer  Education on assessment findings that will be addressed throughout duration of POC.          PATIENT EDUCATION:  Education details: Industrial/product designer Person educated: patient Education method: demo, cues, handout Education comprehension: returned demo, cues     HOME EXERCISE PROGRAM: Access Code:  [email or web address] URL: [email or web address] Date: 11/11/2021 Prepared by: Gwendolyn Grant  Exercises - Seated March  - 2 x daily - 7 x weekly - 2 sets - 10 reps - Seated Long Arc Quad  - 2 x daily - 7 x weekly - 2 sets - 10 reps - Heel Raises with Counter Support  - 2 x daily - 7 x weekly - 2 sets - 10 reps   ASSESSMENT:   CLINICAL IMPRESSION: Patient tolerated session well today focusing on reviewing bed mobility and transfers to improve independence with these functional tasks. She required supervision with supine <>sit transfer and bed mobility, mostly needing cues for proper sequencing of movement. Began gait training with [facility] with patient demonstrating good stability with SPC in Lt hand working on step to pattern progressing to step through. She was able to complete stair negotiation with SPC with no LOB requiring cues for proper sequencing. It was recommended that she begin utilizing [facility] for household ambulation and continue with RW for community ambulation. Patient verbalized understanding. No reports of back pain throughout session.      OBJECTIVE IMPAIRMENTS Abnormal gait, decreased activity tolerance, decreased balance, decreased endurance, decreased knowledge of use of DME, decreased mobility, difficulty walking, decreased ROM, decreased strength, improper body mechanics, postural dysfunction, and pain.    ACTIVITY LIMITATIONS carrying, lifting, bending, sitting, standing, squatting, stairs, transfers, bed mobility, bathing, toileting, and locomotion level   PARTICIPATION LIMITATIONS: meal prep, cleaning, laundry, driving, shopping, community activity, and yard work   PERSONAL FACTORS Age and Fitness are also affecting patient's functional outcome.    REHAB POTENTIAL: Good   CLINICAL DECISION MAKING: Stable/uncomplicated   EVALUATION COMPLEXITY: Low     GOALS: Goals reviewed with patient? Yes   SHORT TERM GOALS: Target date: 12/09/2021   Patient will require no cues  for proper hand placement when performing sit to stand to improve safety with transfers.  Baseline: constant cues to not push from the RW Goal status: INITIAL   2.  Patient will be Mod I with supine <>sit transfer.  Baseline:  Goal status: INITIAL   3.  Patient will maintain SLS for at least 2 seconds bilaterally to improve gait stability.  Baseline:  Goal status: INITIAL   4.  Patient will complete TUG in </=25 seconds to signify a reduction in fall risk  Baseline:  Goal status: INITIAL     LONG TERM GOALS: Target date: 12/09/2021   Patient will demonstrate proper bending and lifting mechanics to reduce stress on her back.  Baseline: unable  Goal status: INITIAL   2.  Patient will ambulate community and household distances with LRAD.  Baseline:  Goal status: INITIAL   3.  Patient will complete 5 x STS in </= 20 seconds to improve her functional strength.   Baseline:  Goal status: INITIAL   4.  Patient will demonstrate 4/5 hip flexor strength to improve ability to negotiate stairs and curbs. Baseline: see above  Goal status: INITIAL   5.  Patient will demonstrate at least 4-/5 bilateral hip abductor strength to improve stability about the chain with walking activity.  Baseline: see above  Goal status: INITIAL   6.  Patient will tolerate at least 30 minutes of standing activity in order to cook for her family.  Baseline: see above  Goal status: INITIAL             7. Patient will score at least 57% on FOTO to signify clinically meaningful improvement in functional abilities.  Baseline: see above                                 Goal status: initial    PLAN: PT FREQUENCY: 2x/week   PT DURATION: 8 weeks   PLANNED INTERVENTIONS: Therapeutic exercises, Therapeutic activity, Neuromuscular re-education, Balance training, Gait training, Patient/Family education, Self Care, Stair training, DME instructions, Cryotherapy, Moist heat, Manual therapy, and  Re-evaluation.   PLAN FOR NEXT SESSION: transfers, LE strengthening, gait training, review and update HEP PRN.  Gwendolyn Grant, PT, DPT, ATC 11/15/21 9:28 AM

## 2021-11-17 NOTE — Therapy (Signed)
OUTPATIENT PHYSICAL THERAPY TREATMENT NOTE   Patient Name: Carol Wilkins MRN: [id number] DOB:[birth year], 66 y.o., female Today's Date: 11/18/2021  PCP: Roselee Nova REFERRING PROVIDER: Jessy Oto, MD  END OF SESSION:   PT End of Session - 11/18/21 0840     Visit Number 3    Number of Visits 17    Date for PT Re-Evaluation 01/08/22    Authorization Type U[employer] MCR    Progress Note Due on Visit 10    PT Start Time 0843    PT Stop Time 0926    PT Time Calculation (min) 43 min    Activity Tolerance Patient tolerated treatment well    Behavior During Therapy WFL for tasks assessed/performed              Past Medical History:  Diagnosis Date   Anxiety attack    Arthritis    Diabetes mellitus without complication ([place])    type 2 on metformin   High cholesterol    Hypertension    Past Surgical History:  Procedure Laterality Date   ABDOMINAL HYSTERECTOMY     in early 28's   CHOLECYSTECTOMY     in the 90's   TEE WITHOUT CARDIOVERSION N/A 07/14/2015   Procedure: TRANSESOPHAGEAL ECHOCARDIOGRAM (TEE);  Surgeon: Adrian Prows, MD;  Location: [place];  Service: Cardiovascular;  Laterality: N/A;   [place]   Patient Active Problem List   Diagnosis Date Noted   Spinal stenosis, lumbar region with neurogenic claudication    Spondylolisthesis, lumbar region    Fusion of spine of lumbar region 08/31/2021   Colon cancer screening 09/09/2020   Constipation 09/09/2020   Flatulence, eructation and gas pain 09/09/2020   Irritable bowel syndrome 09/09/2020   Personal history of colonic polyps 09/09/2020   Rectal bleeding 09/09/2020   Pes planus 09/09/2020   Posterior tibial tendon dysfunction (PTTD) of both lower extremities 09/09/2020   Hav (hallux abducto valgus), unspecified laterality 09/09/2020   Capsulitis 09/09/2020   Low back pain 01/25/2012   Abdominal pain 11/02/2011   Obesity (BMI 30.0-34.9) 10/31/2011   Post herpetic  neuralgia 09/14/2011   DEPRESSION 12/22/2006   HYPERTENSION 12/22/2006   ALLERGIC RHINITIS 12/22/2006   PALPITATIONS, HX OF 12/22/2006    REFERRING DIAG: Z98.1 (ICD-10-CM) - S/P lumbar fusion M43.16 (ICD-10-CM) - Spondylolisthesis at L3-L4 level M54.16 (ICD-10-CM) - Radiculopathy, lumbar region   THERAPY DIAG:  Muscle weakness (generalized)  Other abnormalities of gait and mobility  Other low back pain  Rationale for Evaluation and Treatment Rehabilitation  PERTINENT HISTORY: s/p L3-4 and L4-5 TLIF 08/31/21  PRECAUTIONS: Back Avoid bending, stooping and twisting.  SUBJECTIVE: Patient reports she is feeling ok without pain currently. She reports some difficulty with bed mobility and transfers, but attributes this more to her hip arthritis now.   PAIN:  Are you having pain? None currently; At worst Yes: NPRS scale: 8/10 Pain location: Bilateral hips (Rt>Lt) Pain description: sharp Aggravating factors: laying down Relieving factors: movement    OBJECTIVE: (objective measures completed at initial evaluation unless otherwise dated)  DIAGNOSTIC FINDINGS:  Recent imaging still under review    PATIENT SURVEYS:  FOTO 41%   SCREENING FOR RED FLAGS: Bowel or bladder incontinence: No Spinal tumors: No Cauda equina syndrome: No Compression fracture: No Abdominal aneurysm: No   COGNITION:           Overall cognitive status: Within functional limits for tasks assessed  SENSATION: Not tested   MUSCLE LENGTH: N/A   POSTURE: rounded shoulders and forward head   PALPATION: N/A   LUMBAR ROM:  Deferred due to post-op acuity and current ROM restrictions  Active  A/PROM  eval  Flexion    Extension    Right lateral flexion    Left lateral flexion    Right rotation    Left rotation     (Blank rows = not tested)       LOWER EXTREMITY MMT:     MMT Right eval Left eval  Hip flexion 3+ 4-  Hip extension      Hip abduction 3- 3-  Hip  adduction      Hip internal rotation      Hip external rotation      Knee flexion 5 5  Knee extension 5 5  Ankle dorsiflexion 4 4  Ankle plantarflexion      Ankle inversion      Ankle eversion       (Blank rows = not tested)   LUMBAR SPECIAL TESTS:  N/A   FUNCTIONAL TESTS:                        SLS: unable 5 X STS: 28.3 seconds with BUE support TUG: 35 seconds   11/18/21: TUG 26 seconds with SPC    Sit <> stand: requires cues for proper hand placement (continues to put hands on RW) Sit to supine: Min A  Supine to Sit: Mod A GAIT: Distance walked: 20 ft  Assistive device utilized: Environmental consultant - 2 wheeled Level of assistance: Modified independence Comments: slow gait speed, no push off, decreased step length       TODAY'S TREATMENT  [facility] Adult PT Treatment:                                                DATE: 11/18/21 Therapeutic Exercise: NuStep level 5 x 5 minutes  Hip bridge partial range 2 x 10  SLR partial range 2 x 10  Hip abduction hooklying green band 2 x 10  LAQ 2 x 10; 2#  Resisted hamstring curl blue band 2 x 10  Calf raise 2 x 10  Sit to stand 1 x 5  Updated HEP  [facility] Adult PT Treatment:                                                DATE: 11/15/21 Therapeutic Exercise: NuStep level 4 x 5 minutes  Therapeutic Activity: Log roll technique and proper technique for completing supine <>sit on each side of mat; multiple trials  Bed mobility working on bridging to change position  Gait training: With [facility] working on sequencing of step to and step through pattern  Stair negotiation with SPC focusing on step to pattern with ascent and descent Handout on stair negotiation with [facility]  [facility] Adult PT Treatment:                                                DATE: 11/11/21 Therapeutic Exercise: Demonstrated and  issue initial HEP.      Therapeutic Activity: Log roll technique and proper technique for completing supine <>sit with handout provided  Proper hand placement  for sit to stand transfer  Education on assessment findings that will be addressed throughout duration of POC.          PATIENT EDUCATION:  Education details: HEP Person educated: patient Education method: demo, cues, handout Education comprehension: returned demo, cues     HOME EXERCISE PROGRAM: Access Code: [email or web address] URL: [email or web address] Date: 11/11/2021 Prepared by: Gwendolyn Grant   Exercises - Seated March  - 2 x daily - 7 x weekly - 2 sets - 10 reps - Seated Long Arc Quad  - 2 x daily - 7 x weekly - 2 sets - 10 reps - Heel Raises with Counter Support  - 2 x daily - 7 x weekly - 2 sets - 10 reps   ASSESSMENT:   CLINICAL IMPRESSION: Patient tolerated session well today focusing on hip and knee strengthening. She has notable hip weakness as she is only able to perform glute and hip flexor strengthening through partial range. Her TUG score has improved compared to baseline having nearly met this STG. She reported mild back pain following standing calf raises, otherwise no reports of pain throughout session.      OBJECTIVE IMPAIRMENTS Abnormal gait, decreased activity tolerance, decreased balance, decreased endurance, decreased knowledge of use of DME, decreased mobility, difficulty walking, decreased ROM, decreased strength, improper body mechanics, postural dysfunction, and pain.    ACTIVITY LIMITATIONS carrying, lifting, bending, sitting, standing, squatting, stairs, transfers, bed mobility, bathing, toileting, and locomotion level   PARTICIPATION LIMITATIONS: meal prep, cleaning, laundry, driving, shopping, community activity, and yard work   PERSONAL FACTORS Age and Fitness are also affecting patient's functional outcome.    REHAB POTENTIAL: Good   CLINICAL DECISION MAKING: Stable/uncomplicated   EVALUATION COMPLEXITY: Low     GOALS: Goals reviewed with patient? Yes   SHORT TERM GOALS: Target date: 12/09/2021   Patient will require no cues for  proper hand placement when performing sit to stand to improve safety with transfers.  Baseline: constant cues to not push from the RW Goal status: INITIAL   2.  Patient will be Mod I with supine <>sit transfer.  Baseline:  Goal status: INITIAL   3.  Patient will maintain SLS for at least 2 seconds bilaterally to improve gait stability.  Baseline:  Goal status: INITIAL   4.  Patient will complete TUG in </=25 seconds to signify a reduction in fall risk  Baseline:  Goal status: INITIAL     LONG TERM GOALS: Target date: 12/09/2021   Patient will demonstrate proper bending and lifting mechanics to reduce stress on her back.  Baseline: unable  Goal status: INITIAL   2.  Patient will ambulate community and household distances with LRAD.  Baseline:  Goal status: INITIAL   3.  Patient will complete 5 x STS in </= 20 seconds to improve her functional strength.   Baseline:  Goal status: INITIAL   4.  Patient will demonstrate 4/5 hip flexor strength to improve ability to negotiate stairs and curbs. Baseline: see above  Goal status: INITIAL   5.  Patient will demonstrate at least 4-/5 bilateral hip abductor strength to improve stability about the chain with walking activity.  Baseline: see above  Goal status: INITIAL   6.  Patient will tolerate at least 30 minutes of standing activity in order to cook for her family.  Baseline: see above  Goal status: INITIAL             7. Patient will score at least 57% on FOTO to signify clinically meaningful improvement in functional abilities.                       Baseline: see above                                 Goal status: initial    PLAN: PT FREQUENCY: 2x/week   PT DURATION: 8 weeks   PLANNED INTERVENTIONS: Therapeutic exercises, Therapeutic activity, Neuromuscular re-education, Balance training, Gait training, Patient/Family education, Self Care, Stair training, DME instructions, Cryotherapy, Moist heat, Manual therapy, and  Re-evaluation.   PLAN FOR NEXT SESSION: transfers, LE strengthening, gait training, review and update HEP PRN.  Gwendolyn Grant, PT, DPT, ATC 11/18/21 9:26 AM

## 2021-11-18 ENCOUNTER — Telehealth: Payer: Self-pay | Admitting: Specialist

## 2021-11-18 ENCOUNTER — Ambulatory Visit: Payer: Medicare Other | Attending: Specialist

## 2021-11-18 DIAGNOSIS — R2689 Other abnormalities of gait and mobility: Secondary | ICD-10-CM | POA: Insufficient documentation

## 2021-11-18 DIAGNOSIS — M5459 Other low back pain: Secondary | ICD-10-CM | POA: Diagnosis present

## 2021-11-18 DIAGNOSIS — M6281 Muscle weakness (generalized): Secondary | ICD-10-CM | POA: Insufficient documentation

## 2021-11-18 NOTE — Telephone Encounter (Signed)
Pt called stated that Dr . Louanne Skye was suppose to send a referral for a cyst that he found on her right hip. Please call pt about this matter and send referral. Pt phone number is [phone].

## 2021-11-22 ENCOUNTER — Ambulatory Visit: Payer: Medicare Other

## 2021-11-22 DIAGNOSIS — M5459 Other low back pain: Secondary | ICD-10-CM

## 2021-11-22 DIAGNOSIS — M6281 Muscle weakness (generalized): Secondary | ICD-10-CM | POA: Diagnosis not present

## 2021-11-22 DIAGNOSIS — R2689 Other abnormalities of gait and mobility: Secondary | ICD-10-CM

## 2021-11-22 NOTE — Therapy (Signed)
OUTPATIENT PHYSICAL THERAPY TREATMENT NOTE   Patient Name: Carol Wilkins MRN: [id number] DOB:[birth year], 66 y.o., female Today's Date: 11/22/2021  PCP: Roselee Nova REFERRING PROVIDER: Jessy Oto, MD  END OF SESSION:   PT End of Session - 11/22/21 1100     Visit Number 4    Number of Visits 17    Date for PT Re-Evaluation 01/08/22    Authorization Type UHC MCR    Progress Note Due on Visit 10    PT Start Time 1100    PT Stop Time 1142    PT Time Calculation (min) 42 min    Activity Tolerance Patient tolerated treatment well    Behavior During Therapy WFL for tasks assessed/performed               Past Medical History:  Diagnosis Date   Anxiety attack    Arthritis    Diabetes mellitus without complication ([place])    type 2 on metformin   High cholesterol    Hypertension    Past Surgical History:  Procedure Laterality Date   ABDOMINAL HYSTERECTOMY     in early 21's   CHOLECYSTECTOMY     in the 90's   TEE WITHOUT CARDIOVERSION N/A 07/14/2015   Procedure: TRANSESOPHAGEAL ECHOCARDIOGRAM (TEE);  Surgeon: Adrian Prows, MD;  Location: [place];  Service: Cardiovascular;  Laterality: N/A;   [place]   Patient Active Problem List   Diagnosis Date Noted   Spinal stenosis, lumbar region with neurogenic claudication    Spondylolisthesis, lumbar region    Fusion of spine of lumbar region 08/31/2021   Colon cancer screening 09/09/2020   Constipation 09/09/2020   Flatulence, eructation and gas pain 09/09/2020   Irritable bowel syndrome 09/09/2020   Personal history of colonic polyps 09/09/2020   Rectal bleeding 09/09/2020   Pes planus 09/09/2020   Posterior tibial tendon dysfunction (PTTD) of both lower extremities 09/09/2020   Hav (hallux abducto valgus), unspecified laterality 09/09/2020   Capsulitis 09/09/2020   Low back pain 01/25/2012   Abdominal pain 11/02/2011   Obesity (BMI 30.0-34.9) 10/31/2011   Post herpetic  neuralgia 09/14/2011   DEPRESSION 12/22/2006   HYPERTENSION 12/22/2006   ALLERGIC RHINITIS 12/22/2006   PALPITATIONS, HX OF 12/22/2006    REFERRING DIAG: Z98.1 (ICD-10-CM) - S/P lumbar fusion M43.16 (ICD-10-CM) - Spondylolisthesis at L3-L4 level M54.16 (ICD-10-CM) - Radiculopathy, lumbar region   THERAPY DIAG:  Muscle weakness (generalized)  Other abnormalities of gait and mobility  Other low back pain  Rationale for Evaluation and Treatment Rehabilitation  PERTINENT HISTORY: s/p L3-4 and L4-5 TLIF 08/31/21  PRECAUTIONS: Back Avoid bending, stooping and twisting.  SUBJECTIVE: Patient reports her back is feeling good without pain currently. She reports her hip arthritis is bothering her and she needs to f/u with her doctor for this.  PAIN:  Are you having pain? None currently; At worst Yes: NPRS scale: 8/10 Pain location: Bilateral hips (Rt>Lt) Pain description: sharp Aggravating factors: laying down Relieving factors: movement    OBJECTIVE: (objective measures completed at initial evaluation unless otherwise dated)  DIAGNOSTIC FINDINGS:  Recent imaging still under review    PATIENT SURVEYS:  FOTO 41%   SCREENING FOR RED FLAGS: Bowel or bladder incontinence: No Spinal tumors: No Cauda equina syndrome: No Compression fracture: No Abdominal aneurysm: No   COGNITION:           Overall cognitive status: Within functional limits for tasks assessed  SENSATION: Not tested   MUSCLE LENGTH: N/A   POSTURE: rounded shoulders and forward head   PALPATION: N/A   LUMBAR ROM:  Deferred due to post-op acuity and current ROM restrictions  Active  A/PROM  eval  Flexion    Extension    Right lateral flexion    Left lateral flexion    Right rotation    Left rotation     (Blank rows = not tested)       LOWER EXTREMITY MMT:     MMT Right eval Left eval  Hip flexion 3+ 4-  Hip extension      Hip abduction 3- 3-  Hip adduction      Hip  internal rotation      Hip external rotation      Knee flexion 5 5  Knee extension 5 5  Ankle dorsiflexion 4 4  Ankle plantarflexion      Ankle inversion      Ankle eversion       (Blank rows = not tested)   LUMBAR SPECIAL TESTS:  N/A   FUNCTIONAL TESTS:                        SLS: unable 5 X STS: 28.3 seconds with BUE support TUG: 35 seconds   11/18/21: TUG 26 seconds with SPC    Sit <> stand: requires cues for proper hand placement (continues to put hands on RW) Sit to supine: Min A  Supine to Sit: Mod A  11/22/21: Sit <>supine: Mod I (increased time)  GAIT: Distance walked: 20 ft  Assistive device utilized: Environmental consultant - 2 wheeled Level of assistance: Modified independence Comments: slow gait speed, no push off, decreased step length       TODAY'S TREATMENT  [facility] Adult PT Treatment:                                                DATE: 11/22/21 Therapeutic Exercise: NuStep level 5 x 5 minutes  Sidelying hip abduction 2 x 10; partial range  SLR 2 x 10 partial range  Wall squats partial range 2 x 10  Heel/toe raises 2 x 10  Sit to stand 1 x 10   [facility] Adult PT Treatment:                                                DATE: 11/18/21 Therapeutic Exercise: NuStep level 5 x 5 minutes  Hip bridge partial range 2 x 10  SLR partial range 2 x 10  Hip abduction hooklying green band 2 x 10  LAQ 2 x 10; 2#  Resisted hamstring curl blue band 2 x 10  Calf raise 2 x 10  Sit to stand 1 x 5  Updated HEP  [facility] Adult PT Treatment:                                                DATE: 11/15/21 Therapeutic Exercise: NuStep level 4 x 5 minutes  Therapeutic Activity: Log roll technique and proper technique for completing supine <>sit  on each side of mat; multiple trials  Bed mobility working on bridging to change position  Gait training: With [facility] working on sequencing of step to and step through pattern  Stair negotiation with SPC focusing on step to pattern with ascent and descent Handout  on stair negotiation with [facility]          PATIENT EDUCATION:  Education details: N/A Person educated: N/A Education method: N/A Education comprehension: N/A     HOME EXERCISE PROGRAM: Access Code: [email or web address] URL: [email or web address] Date: 11/11/2021 Prepared by: Gwendolyn Grant   Exercises - Seated March  - 2 x daily - 7 x weekly - 2 sets - 10 reps - Seated Long Arc Quad  - 2 x daily - 7 x weekly - 2 sets - 10 reps - Heel Raises with Counter Support  - 2 x daily - 7 x weekly - 2 sets - 10 reps   ASSESSMENT:   CLINICAL IMPRESSION: Patient tolerated session well today with continued emphasis on hip strengthening. Strength deficits remain in bilateral hips as she is only able to complete [place] strengthening through partial range secondary to weakness. She has difficulty maintaining lumbopelvic stability with gluteal strengthening requiring tactile and verbal cues to maintain appropriate alignment. With standing activity she demonstrates excessive bilateral foot ER (Rt>Lt) with inability to correct. No reports of back pain throughout session. She is modified independent with sit <> supine transfers having met this short term functional goal.     OBJECTIVE IMPAIRMENTS Abnormal gait, decreased activity tolerance, decreased balance, decreased endurance, decreased knowledge of use of DME, decreased mobility, difficulty walking, decreased ROM, decreased strength, improper body mechanics, postural dysfunction, and pain.    ACTIVITY LIMITATIONS carrying, lifting, bending, sitting, standing, squatting, stairs, transfers, bed mobility, bathing, toileting, and locomotion level   PARTICIPATION LIMITATIONS: meal prep, cleaning, laundry, driving, shopping, community activity, and yard work   PERSONAL FACTORS Age and Fitness are also affecting patient's functional outcome.    REHAB POTENTIAL: Good   CLINICAL DECISION MAKING: Stable/uncomplicated   EVALUATION COMPLEXITY: Low      GOALS: Goals reviewed with patient? Yes   SHORT TERM GOALS: Target date: 12/09/2021   Patient will require no cues for proper hand placement when performing sit to stand to improve safety with transfers.  Baseline: constant cues to not push from the RW; 11/22/21 unable to recall proper placement  Goal status: ongoing    2.  Patient will be Mod I with supine <>sit transfer.  Baseline:  Goal status: achieved    3.  Patient will maintain SLS for at least 2 seconds bilaterally to improve gait stability.  Baseline:  Goal status: INITIAL   4.  Patient will complete TUG in </=25 seconds to signify a reduction in fall risk  Baseline:  Goal status: INITIAL     LONG TERM GOALS: Target date: 12/09/2021   Patient will demonstrate proper bending and lifting mechanics to reduce stress on her back.  Baseline: unable  Goal status: INITIAL   2.  Patient will ambulate community and household distances with LRAD.  Baseline:  Goal status: INITIAL   3.  Patient will complete 5 x STS in </= 20 seconds to improve her functional strength.   Baseline:  Goal status: INITIAL   4.  Patient will demonstrate 4/5 hip flexor strength to improve ability to negotiate stairs and curbs. Baseline: see above  Goal status: INITIAL   5.  Patient will demonstrate at least 4-/5 bilateral hip abductor strength to improve  stability about the chain with walking activity.  Baseline: see above  Goal status: INITIAL   6.  Patient will tolerate at least 30 minutes of standing activity in order to cook for her family.  Baseline: see above  Goal status: INITIAL             7. Patient will score at least 57% on FOTO to signify clinically meaningful improvement in functional abilities.                       Baseline: see above                                 Goal status: initial    PLAN: PT FREQUENCY: 2x/week   PT DURATION: 8 weeks   PLANNED INTERVENTIONS: Therapeutic exercises, Therapeutic activity, Neuromuscular  re-education, Balance training, Gait training, Patient/Family education, Self Care, Stair training, DME instructions, Cryotherapy, Moist heat, Manual therapy, and Re-evaluation.   PLAN FOR NEXT SESSION: transfers from raised mat height, LE strengthening, gait training, review and update HEP PRN.   Gwendolyn Grant, PT, DPT, ATC 11/22/21 11:44 AM

## 2021-11-24 ENCOUNTER — Ambulatory Visit: Payer: Medicare Other | Admitting: Physical Therapy

## 2021-11-24 ENCOUNTER — Other Ambulatory Visit: Payer: Self-pay

## 2021-11-24 ENCOUNTER — Encounter: Payer: Self-pay | Admitting: Physical Therapy

## 2021-11-24 DIAGNOSIS — M6281 Muscle weakness (generalized): Secondary | ICD-10-CM | POA: Diagnosis not present

## 2021-11-24 DIAGNOSIS — R2689 Other abnormalities of gait and mobility: Secondary | ICD-10-CM

## 2021-11-24 DIAGNOSIS — M5459 Other low back pain: Secondary | ICD-10-CM

## 2021-11-24 NOTE — Therapy (Signed)
OUTPATIENT PHYSICAL THERAPY TREATMENT NOTE   Patient Name: Carol Wilkins MRN: [id number] DOB:06-26-1955, 66 y.o., female Today's Date: 11/24/2021  PCP: Roselee Nova REFERRING PROVIDER: Jessy Oto, MD  END OF SESSION:   PT End of Session - 11/24/21 0919     Visit Number 5    Number of Visits 17    Date for PT Re-Evaluation 01/08/22    Authorization Type [employer] MCR    Progress Note Due on Visit 10    PT Start Time 0915    PT Stop Time 0955    PT Time Calculation (min) 40 min    Activity Tolerance Patient tolerated treatment well    Behavior During Therapy WFL for tasks assessed/performed                Past Medical History:  Diagnosis Date   Anxiety attack    Arthritis    Diabetes mellitus without complication ([place])    type 2 on metformin   High cholesterol    Hypertension    Past Surgical History:  Procedure Laterality Date   ABDOMINAL HYSTERECTOMY     in early 66's   CHOLECYSTECTOMY     in the 90's   TEE WITHOUT CARDIOVERSION N/A 07/14/2015   Procedure: TRANSESOPHAGEAL ECHOCARDIOGRAM (TEE);  Surgeon: Adrian Prows, MD;  Location: [facility];  Service: Cardiovascular;  Laterality: N/A;   [place]   Patient Active Problem List   Diagnosis Date Noted   Spinal stenosis, lumbar region with neurogenic claudication    Spondylolisthesis, lumbar region    Fusion of spine of lumbar region 08/31/2021   Colon cancer screening 09/09/2020   Constipation 09/09/2020   Flatulence, eructation and gas pain 09/09/2020   Irritable bowel syndrome 09/09/2020   Personal history of colonic polyps 09/09/2020   Rectal bleeding 09/09/2020   Pes planus 09/09/2020   Posterior tibial tendon dysfunction (PTTD) of both lower extremities 09/09/2020   Hav (hallux abducto valgus), unspecified laterality 09/09/2020   Capsulitis 09/09/2020   Low back pain 01/25/2012   Abdominal pain 11/02/2011   Obesity (BMI 30.0-34.9) 10/31/2011   Post herpetic  neuralgia 09/14/2011   DEPRESSION 12/22/2006   HYPERTENSION 12/22/2006   ALLERGIC RHINITIS 12/22/2006   PALPITATIONS, HX OF 12/22/2006    REFERRING DIAG: Z98.1 (ICD-10-CM) - S/P lumbar fusion M43.16 (ICD-10-CM) - Spondylolisthesis at L3-L4 level M54.16 (ICD-10-CM) - Radiculopathy, lumbar region   THERAPY DIAG:  Muscle weakness (generalized)  Other abnormalities of gait and mobility  Other low back pain  Rationale for Evaluation and Treatment Rehabilitation  PERTINENT HISTORY: s/p L3-4 and L4-5 TLIF 08/31/21  PRECAUTIONS: Back Avoid bending, stooping and twisting  SUBJECTIVE: Patient reports her back is feeling good without pain currently. She reports her hip arthritis is bothering her and she needs to f/u with her doctor for this.   PAIN:  Are you having pain? No NPRS scale: 0/10 Pain location: Bilateral hips (Rt>Lt) Pain description: sharp Aggravating factors: laying down Relieving factors: movement    OBJECTIVE: (objective measures completed at initial evaluation unless otherwise dated) PATIENT SURVEYS:  FOTO 41%   POSTURE: rounded shoulders and forward head   LUMBAR ROM:  Deferred due to post-op acuity and current ROM restrictions  Active  A/PROM  eval  Flexion    Extension    Right lateral flexion    Left lateral flexion    Right rotation    Left rotation     (Blank rows =  not tested)   LOWER EXTREMITY MMT:     MMT Right eval Left eval  Hip flexion 3+ 4-  Hip extension      Hip abduction 3- 3-  Hip adduction      Hip internal rotation      Hip external rotation      Knee flexion 5 5  Knee extension 5 5  Ankle dorsiflexion 4 4  Ankle plantarflexion      Ankle inversion      Ankle eversion       (Blank rows = not tested)   FUNCTIONAL TESTS:                        SLS: unable 5 X STS: 28.3 seconds with BUE support TUG: 35 seconds   11/18/21: TUG 26 seconds with SPC    Sit <> stand: requires cues for proper hand placement (continues to put  hands on RW) Sit to supine: Min A  Supine to Sit: Mod A  11/22/21: Sit <>supine: Mod I (increased time)   GAIT: Distance walked: 20 ft  Assistive device utilized: Environmental consultant - 2 wheeled Level of assistance: Modified independence Comments: slow gait speed, no push off, decreased step length      TODAY'S TREATMENT  [facility] Adult PT Treatment:                                                DATE: 11/24/2021 Therapeutic Exercise: NuStep level L5 x 5 minutes with UE/LE while taking subjective Piriformis stretch 2 x 20 sec each Posterior pelvic tilt 10 x 5 sec SLR 2 x 10 Hookling clamshell with green 2 x 10 Bridge with adductor ball squeeze 2 x 10 - partial range Sidelying hip abduction 2 x 10; partial range  Wall squats partial range 2 x 10  Therapeutic Activities: Instruction and practice patient getting in and out of higher bed for home, instruction on various methods and maintaining spinal precautions using log roll   [facility] Adult PT Treatment:                                                DATE: 11/22/21 Therapeutic Exercise: NuStep level 5 x 5 minutes  Sidelying hip abduction 2 x 10; partial range  SLR 2 x 10 partial range  Wall squats partial range 2 x 10  Heel/toe raises 2 x 10  Sit to stand 1 x 10   [facility] Adult PT Treatment:                                                DATE: 11/18/21 Therapeutic Exercise: NuStep level 5 x 5 minutes  Hip bridge partial range 2 x 10  SLR partial range 2 x 10  Hip abduction hooklying green band 2 x 10  LAQ 2 x 10; 2#  Resisted hamstring curl blue band 2 x 10  Calf raise 2 x 10  Sit to stand 1 x 5  Updated HEP   PATIENT EDUCATION:  Education details: Bed transfers Person educated: Patient Education method:  Verbal cueing, demonstration Education comprehension: Verbalized understand, returned demonstration   HOME EXERCISE PROGRAM: Access Code: [email or web address]    ASSESSMENT: CLINICAL IMPRESSION: Patient tolerated therapy well with no adverse  effects. Therapy focused primarily on progression of core and hip strengthening with good tolerance. Practiced working on transfers in and out of a higher bed so patient can return to sleeping in bed at home. She was able to correctly enter and exit the bed while maintaining proper precautions with cueing and without assist. She seems to be doing well with her exercises and no changes made to HEP this visit. Patient would benefit from continued skilled PT to progress her mobility and strength in order to maximize her functional ability.     OBJECTIVE IMPAIRMENTS Abnormal gait, decreased activity tolerance, decreased balance, decreased endurance, decreased knowledge of use of DME, decreased mobility, difficulty walking, decreased ROM, decreased strength, improper body mechanics, postural dysfunction, and pain.    ACTIVITY LIMITATIONS carrying, lifting, bending, sitting, standing, squatting, stairs, transfers, bed mobility, bathing, toileting, and locomotion level   PARTICIPATION LIMITATIONS: meal prep, cleaning, laundry, driving, shopping, community activity, and yard work   PERSONAL FACTORS Age and Fitness are also affecting patient's functional outcome.      GOALS: Goals reviewed with patient? Yes   SHORT TERM GOALS: Target date: 12/09/2021   Patient will require no cues for proper hand placement when performing sit to stand to improve safety with transfers.  Baseline: constant cues to not push from the RW; 11/22/21 unable to recall proper placement  Goal status: ongoing    2.  Patient will be Mod I with supine <>sit transfer.  Baseline:  Goal status: achieved    3.  Patient will maintain SLS for at least 2 seconds bilaterally to improve gait stability.  Baseline:  Goal status: INITIAL   4.  Patient will complete TUG in </=25 seconds to signify a reduction in fall risk  Baseline:  Goal status: INITIAL     LONG TERM GOALS: Target date: 12/09/2021   Patient will demonstrate proper  bending and lifting mechanics to reduce stress on her back.  Baseline: unable  Goal status: INITIAL   2.  Patient will ambulate community and household distances with LRAD.  Baseline:  Goal status: INITIAL   3.  Patient will complete 5 x STS in </= 20 seconds to improve her functional strength.   Baseline:  Goal status: INITIAL   4.  Patient will demonstrate 4/5 hip flexor strength to improve ability to negotiate stairs and curbs. Baseline: see above  Goal status: INITIAL   5.  Patient will demonstrate at least 4-/5 bilateral hip abductor strength to improve stability about the chain with walking activity.  Baseline: see above  Goal status: INITIAL   6.  Patient will tolerate at least 30 minutes of standing activity in order to cook for her family.  Baseline: see above  Goal status: INITIAL             7. Patient will score at least 57% on FOTO to signify clinically meaningful improvement in functional abilities.                       Baseline: see above                                 Goal status: initial    PLAN: PT FREQUENCY: 2x/week   PT DURATION:  8 weeks   PLANNED INTERVENTIONS: Therapeutic exercises, Therapeutic activity, Neuromuscular re-education, Balance training, Gait training, Patient/Family education, Self Care, Stair training, DME instructions, Cryotherapy, Moist heat, Manual therapy, and Re-evaluation.   PLAN FOR NEXT SESSION: transfers from raised mat height, LE strengthening, gait training, review and update HEP PRN.    Hilda Blades, PT, DPT, LAT, ATC 11/24/21  10:01 AM Phone: [phone] Fax: [phone]

## 2021-11-30 ENCOUNTER — Encounter: Payer: Self-pay | Admitting: Physical Therapy

## 2021-11-30 ENCOUNTER — Ambulatory Visit: Payer: Medicare Other | Admitting: Physical Therapy

## 2021-11-30 DIAGNOSIS — M5459 Other low back pain: Secondary | ICD-10-CM

## 2021-11-30 DIAGNOSIS — M6281 Muscle weakness (generalized): Secondary | ICD-10-CM | POA: Diagnosis not present

## 2021-11-30 DIAGNOSIS — R2689 Other abnormalities of gait and mobility: Secondary | ICD-10-CM

## 2021-11-30 NOTE — Therapy (Signed)
OUTPATIENT PHYSICAL THERAPY TREATMENT NOTE   Patient Name: Carol Wilkins MRN: [id number] DOB:[birth year], 66 y.o., female Today's Date: 11/30/2021  PCP: Roselee Nova REFERRING PROVIDER: Jessy Oto, MD  END OF SESSION:   PT End of Session - 11/30/21 0851     Visit Number 6    Number of Visits 17    Date for PT Re-Evaluation 01/08/22    Authorization Type U[employer] MCR    PT Start Time 0847                Past Medical History:  Diagnosis Date   Anxiety attack    Arthritis    Diabetes mellitus without complication ([place])    type 2 on metformin   High cholesterol    Hypertension    Past Surgical History:  Procedure Laterality Date   ABDOMINAL HYSTERECTOMY     in early 86's   CHOLECYSTECTOMY     in the 90's   TEE WITHOUT CARDIOVERSION N/A 07/14/2015   Procedure: TRANSESOPHAGEAL ECHOCARDIOGRAM (TEE);  Surgeon: Adrian Prows, MD;  Location: [facility] ENDOSCOPY;  Service: Cardiovascular;  Laterality: N/A;   [place]   Patient Active Problem List   Diagnosis Date Noted   Spinal stenosis, lumbar region with neurogenic claudication    Spondylolisthesis, lumbar region    Fusion of spine of lumbar region 08/31/2021   Colon cancer screening 09/09/2020   Constipation 09/09/2020   Flatulence, eructation and gas pain 09/09/2020   Irritable bowel syndrome 09/09/2020   Personal history of colonic polyps 09/09/2020   Rectal bleeding 09/09/2020   Pes planus 09/09/2020   Posterior tibial tendon dysfunction (PTTD) of both lower extremities 09/09/2020   Hav (hallux abducto valgus), unspecified laterality 09/09/2020   Capsulitis 09/09/2020   Low back pain 01/25/2012   Abdominal pain 11/02/2011   Obesity (BMI 30.0-34.9) 10/31/2011   Post herpetic neuralgia 09/14/2011   DEPRESSION 12/22/2006   HYPERTENSION 12/22/2006   ALLERGIC RHINITIS 12/22/2006   PALPITATIONS, HX OF 12/22/2006    REFERRING DIAG: Z98.1 (ICD-10-CM) - S/P lumbar fusion M43.16  (ICD-10-CM) - Spondylolisthesis at L3-L4 level M54.16 (ICD-10-CM) - Radiculopathy, lumbar region   THERAPY DIAG:  Muscle weakness (generalized)  Other abnormalities of gait and mobility  Other low back pain  Rationale for Evaluation and Treatment Rehabilitation  PERTINENT HISTORY: s/p L3-4 and L4-5 TLIF 08/31/21  PRECAUTIONS: Back Avoid bending, stooping and twisting  SUBJECTIVE: Patient reports her back is okay but she has some pain on right side and hip.    PAIN:  Are you having pain? yes NPRS scale: 5/10 Pain location: R low back and hip Pain description: stiff, sore Aggravating factors: laying down Relieving factors: movement    OBJECTIVE: (objective measures completed at initial evaluation unless otherwise dated) PATIENT SURVEYS:  FOTO 41% (predicted 57%)  FOTO status 11/30/21-54%   POSTURE: rounded shoulders and forward head   LUMBAR ROM:  Deferred due to post-op acuity and current ROM restrictions  Active  A/PROM  eval  Flexion    Extension    Right lateral flexion    Left lateral flexion    Right rotation    Left rotation     (Blank rows = not tested)   LOWER EXTREMITY MMT:     MMT Right eval Left eval  Hip flexion 3+ 4-  Hip extension      Hip abduction 3- 3-  Hip adduction      Hip internal rotation  Hip external rotation      Knee flexion 5 5  Knee extension 5 5  Ankle dorsiflexion 4 4  Ankle plantarflexion      Ankle inversion      Ankle eversion       (Blank rows = not tested)   FUNCTIONAL TESTS:                        SLS: unable 5 X STS: 28.3 seconds with BUE support TUG: 35 seconds   11/18/21: TUG 26 seconds with SPC    Sit <> stand: requires cues for proper hand placement (continues to put hands on RW) Sit to supine: Min A  Supine to Sit: Mod A  11/22/21: Sit <>supine: Mod I (increased time)   GAIT: Distance walked: 20 ft  Assistive device utilized: Environmental consultant - 2 wheeled Level of assistance: Modified independence Comments:  slow gait speed, no push off, decreased step length      TODAY'S TREATMENT  [facility] Adult PT Treatment:                                                DATE: 11/30/2021 Therapeutic Exercise: NuStep level L5 x 5 minutes with UE/LE while taking subjective Figure 4 stretch 3 x 15 sec each Posterior pelvic tilt 10 x 5 sec SLR 2 x 10 Sidelying hip abduction 2 x 10; partial range  STS 10 x 2    [facility] Adult PT Treatment:                                                DATE: 11/24/2021 Therapeutic Exercise: NuStep level L5 x 5 minutes with UE/LE while taking subjective Piriformis stretch 2 x 20 sec each Posterior pelvic tilt 10 x 5 sec SLR 2 x 10 Hookling clamshell with green 2 x 10 Bridge with adductor ball squeeze 2 x 10 - partial range Sidelying hip abduction 2 x 10; partial range  Wall squats partial range 2 x 10  Therapeutic Activities: Instruction and practice patient getting in and out of higher bed for home, instruction on various methods and maintaining spinal precautions using log roll   [facility] Adult PT Treatment:                                                DATE: 11/22/21 Therapeutic Exercise: NuStep level 5 x 5 minutes  Sidelying hip abduction 2 x 10; partial range  SLR 2 x 10 partial range  Wall squats partial range 2 x 10  Heel/toe raises 2 x 10  Sit to stand 1 x 10   [facility] Adult PT Treatment:                                                DATE: 11/18/21 Therapeutic Exercise: NuStep level 5 x 5 minutes  Hip bridge partial range 2 x 10  SLR partial range 2 x 10  Hip  abduction hooklying green band 2 x 10  LAQ 2 x 10; 2#  Resisted hamstring curl blue band 2 x 10  Calf raise 2 x 10  Sit to stand 1 x 5  Updated HEP   PATIENT EDUCATION:  Education details: Bed transfers Person educated: Patient Education method: Verbal cueing, demonstration Education comprehension: Verbalized understand, returned demonstration   HOME EXERCISE PROGRAM: Access Code: [email or web address]     ASSESSMENT: CLINICAL IMPRESSION: Patient tolerated therapy well with no adverse effects. Therapy focused primarily on core and hip strengthening with good tolerance. Her FOTO score has improved 41% to 54%. Patient would benefit from continued skilled PT to progress her mobility and strength in order to maximize her functional ability.     OBJECTIVE IMPAIRMENTS Abnormal gait, decreased activity tolerance, decreased balance, decreased endurance, decreased knowledge of use of DME, decreased mobility, difficulty walking, decreased ROM, decreased strength, improper body mechanics, postural dysfunction, and pain.    ACTIVITY LIMITATIONS carrying, lifting, bending, sitting, standing, squatting, stairs, transfers, bed mobility, bathing, toileting, and locomotion level   PARTICIPATION LIMITATIONS: meal prep, cleaning, laundry, driving, shopping, community activity, and yard work   PERSONAL FACTORS Age and Fitness are also affecting patient's functional outcome.      GOALS: Goals reviewed with patient? Yes   SHORT TERM GOALS: Target date: 12/09/2021   Patient will require no cues for proper hand placement when performing sit to stand to improve safety with transfers.  Baseline: constant cues to not push from the RW; 11/22/21 unable to recall proper placement  Goal status: ongoing    2.  Patient will be Mod I with supine <>sit transfer.  Baseline:  Goal status: achieved    3.  Patient will maintain SLS for at least 2 seconds bilaterally to improve gait stability.  Baseline:  Goal status: INITIAL   4.  Patient will complete TUG in </=25 seconds to signify a reduction in fall risk  Baseline:  Goal status: INITIAL     LONG TERM GOALS: Target date: 12/09/2021   Patient will demonstrate proper bending and lifting mechanics to reduce stress on her back.  Baseline: unable  Goal status: INITIAL   2.  Patient will ambulate community and household distances with LRAD.  Baseline:  Goal status:  INITIAL   3.  Patient will complete 5 x STS in </= 20 seconds to improve her functional strength.   Baseline:  Goal status: INITIAL   4.  Patient will demonstrate 4/5 hip flexor strength to improve ability to negotiate stairs and curbs. Baseline: see above  Goal status: INITIAL   5.  Patient will demonstrate at least 4-/5 bilateral hip abductor strength to improve stability about the chain with walking activity.  Baseline: see above  Goal status: INITIAL   6.  Patient will tolerate at least 30 minutes of standing activity in order to cook for her family.  Baseline: see above  Goal status: INITIAL             7. Patient will score at least 57% on FOTO to signify clinically meaningful improvement in functional abilities.                       Baseline: see above                                 Goal status: initial    PLAN: PT FREQUENCY: 2x/week   PT  DURATION: 8 weeks   PLANNED INTERVENTIONS: Therapeutic exercises, Therapeutic activity, Neuromuscular re-education, Balance training, Gait training, Patient/Family education, Self Care, Stair training, DME instructions, Cryotherapy, Moist heat, Manual therapy, and Re-evaluation.   PLAN FOR NEXT SESSION: transfers from raised mat height, LE strengthening, gait training, review and update HEP PRN. check [place], PTA 11/30/21 9:05 AM Phone: [phone] Fax: [phone]

## 2021-12-01 NOTE — Therapy (Signed)
OUTPATIENT PHYSICAL THERAPY TREATMENT NOTE   Patient Name: Carol Wilkins MRN: [id number] DOB:November 06, 1955, 66 y.o., female Today's Date: 12/02/2021  PCP: Roselee Nova REFERRING PROVIDER: Jessy Oto, MD  END OF SESSION:   PT End of Session - 12/02/21 1053     Visit Number 7    Number of Visits 17    Date for PT Re-Evaluation 01/08/22    Authorization Type UHC MCR    Progress Note Due on Visit 10    PT Start Time 1045    PT Stop Time 1130    PT Time Calculation (min) 45 min    Activity Tolerance Patient tolerated treatment well    Behavior During Therapy WFL for tasks assessed/performed                 Past Medical History:  Diagnosis Date   Anxiety attack    Arthritis    Diabetes mellitus without complication ([place])    type 2 on metformin   High cholesterol    Hypertension    Past Surgical History:  Procedure Laterality Date   ABDOMINAL HYSTERECTOMY     in early 73's   CHOLECYSTECTOMY     in the 90's   TEE WITHOUT CARDIOVERSION N/A 07/14/2015   Procedure: TRANSESOPHAGEAL ECHOCARDIOGRAM (TEE);  Surgeon: Adrian Prows, MD;  Location: [place];  Service: Cardiovascular;  Laterality: N/A;   [place]   Patient Active Problem List   Diagnosis Date Noted   Spinal stenosis, lumbar region with neurogenic claudication    Spondylolisthesis, lumbar region    Fusion of spine of lumbar region 08/31/2021   Colon cancer screening 09/09/2020   Constipation 09/09/2020   Flatulence, eructation and gas pain 09/09/2020   Irritable bowel syndrome 09/09/2020   Personal history of colonic polyps 09/09/2020   Rectal bleeding 09/09/2020   Pes planus 09/09/2020   Posterior tibial tendon dysfunction (PTTD) of both lower extremities 09/09/2020   Hav (hallux abducto valgus), unspecified laterality 09/09/2020   Capsulitis 09/09/2020   Low back pain 01/25/2012   Abdominal pain 11/02/2011   Obesity (BMI 30.0-34.9) 10/31/2011   Post  herpetic neuralgia 09/14/2011   DEPRESSION 12/22/2006   HYPERTENSION 12/22/2006   ALLERGIC RHINITIS 12/22/2006   PALPITATIONS, HX OF 12/22/2006    REFERRING DIAG: Z98.1 (ICD-10-CM) - S/P lumbar fusion M43.16 (ICD-10-CM) - Spondylolisthesis at L3-L4 level M54.16 (ICD-10-CM) - Radiculopathy, lumbar region   THERAPY DIAG:  Muscle weakness (generalized)  Other abnormalities of gait and mobility  Other low back pain  Rationale for Evaluation and Treatment Rehabilitation  PERTINENT HISTORY: s/p L3-4 and L4-5 TLIF 08/31/21  PRECAUTIONS: Back Avoid bending, stooping and twisting  SUBJECTIVE: Patient reports she is doing better. She is consistent with her HEP   PAIN:  Are you having pain? yes NPRS scale: 4/10 Pain location: Right low back and hip Pain description: stiff, sore Aggravating factors: laying down Relieving factors: movement    OBJECTIVE: (objective measures completed at initial evaluation unless otherwise dated) PATIENT SURVEYS:  FOTO 41% (predicted 57%)  11/30/21: 54%   POSTURE: rounded shoulders and forward head   LUMBAR ROM:  Deferred due to post-op acuity and current ROM restrictions  Active  A/PROM  eval  Flexion    Extension    Right lateral flexion    Left lateral flexion    Right rotation    Left rotation     (Blank rows = not tested)   LOWER EXTREMITY  MMT:     MMT Right eval Left eval  Hip flexion 3+ 4-  Hip extension      Hip abduction 3- 3-  Hip adduction      Hip internal rotation      Hip external rotation      Knee flexion 5 5  Knee extension 5 5  Ankle dorsiflexion 4 4  Ankle plantarflexion      Ankle inversion      Ankle eversion       (Blank rows = not tested)   FUNCTIONAL TESTS:                        SLS: unable 5 X STS: 28.3 seconds with BUE support TUG: 35 seconds  11/18/21: 26 seconds with SPC    Sit <> stand: requires cues for proper hand placement (continues to put hands on RW) Sit to supine: Min A  Supine to Sit:  Mod A  11/22/21: Sit <>supine: Mod I (increased time)   GAIT: - assessed 12/02/2021 Distance walked: - Assistive device utilized: SPC Level of assistance: Modified independence Comments: slow gait speed      TODAY'S TREATMENT  [facility] Adult PT Treatment:                                                DATE: 12/02/2021 Therapeutic Exercise: NuStep level L5 x 5 minutes with UE/LE while taking subjective SLR 2 x 10 each Bridge with adductor ball squeeze 2 x 10 - partial range Sidelying hip abduction 2 x 10 each Sit to stand 2 x 10 Standing heel raises 2 x 15 Standing hip extension 2 x 10 each Standing alternating march 2 x 20 Forward step-up 4" 2 x 10 each   [facility] Adult PT Treatment:                                                DATE: 11/30/2021 Therapeutic Exercise: NuStep level L5 x 5 minutes with UE/LE while taking subjective Figure 4 stretch 3 x 15 sec each Posterior pelvic tilt 10 x 5 sec SLR 2 x 10 Sidelying hip abduction 2 x 10; partial range  STS 10 x 2   [facility] Adult PT Treatment:                                                DATE: 11/24/2021 Therapeutic Exercise: NuStep level L5 x 5 minutes with UE/LE while taking subjective Piriformis stretch 2 x 20 sec each Posterior pelvic tilt 10 x 5 sec SLR 2 x 10 Hookling clamshell with green 2 x 10 Bridge with adductor ball squeeze 2 x 10 - partial range Sidelying hip abduction 2 x 10; partial range  Wall squats partial range 2 x 10  Therapeutic Activities: Instruction and practice patient getting in and out of higher bed for home, instruction on various methods and maintaining spinal precautions using log roll  PATIENT EDUCATION:  Education details: HEP Person educated: Patient Education method: Verbal cueing, demonstration Education comprehension: Verbalized understand, returned demonstration   HOME EXERCISE  PROGRAM: Access Code: [email or web address]    ASSESSMENT: CLINICAL IMPRESSION: Patient tolerated therapy well with no adverse  effects. Therapy focused primarily on progression of strengthening and incorporating more standing exercises with good tolerance. She was able to demonstrate improved range with hip strengthening exercises and reports feeling stronger. She does continue to require cueing for proper technique with exercises and maintaining core activation. Patient would benefit from continued skilled PT to progress her mobility and strength in order to maximize her functional ability.     OBJECTIVE IMPAIRMENTS Abnormal gait, decreased activity tolerance, decreased balance, decreased endurance, decreased knowledge of use of DME, decreased mobility, difficulty walking, decreased ROM, decreased strength, improper body mechanics, postural dysfunction, and pain.    ACTIVITY LIMITATIONS carrying, lifting, bending, sitting, standing, squatting, stairs, transfers, bed mobility, bathing, toileting, and locomotion level   PARTICIPATION LIMITATIONS: meal prep, cleaning, laundry, driving, shopping, community activity, and yard work   PERSONAL FACTORS Age and Fitness are also affecting patient's functional outcome.      GOALS: Goals reviewed with patient? Yes   SHORT TERM GOALS: Target date: 12/09/2021   Patient will require no cues for proper hand placement when performing sit to stand to improve safety with transfers.  Baseline: constant cues to not push from the RW; 11/22/21 unable to recall proper placement  Goal status: ongoing    2.  Patient will be Mod I with supine <>sit transfer.  Baseline:  Goal status: achieved    3.  Patient will maintain SLS for at least 2 seconds bilaterally to improve gait stability.  Baseline:  Goal status: INITIAL   4.  Patient will complete TUG in </=25 seconds to signify a reduction in fall risk  Baseline:  Goal status: INITIAL     LONG TERM GOALS: Target date: 12/09/2021   Patient will demonstrate proper bending and lifting mechanics to reduce stress on her back.  Baseline:  unable  Goal status: INITIAL   2.  Patient will ambulate community and household distances with LRAD.  Baseline:  Goal status: INITIAL   3.  Patient will complete 5 x STS in </= 20 seconds to improve her functional strength.   Baseline:  Goal status: INITIAL   4.  Patient will demonstrate 4/5 hip flexor strength to improve ability to negotiate stairs and curbs. Baseline: see above  Goal status: INITIAL   5.  Patient will demonstrate at least 4-/5 bilateral hip abductor strength to improve stability about the chain with walking activity.  Baseline: see above  Goal status: INITIAL   6.  Patient will tolerate at least 30 minutes of standing activity in order to cook for her family.  Baseline: see above  Goal status: INITIAL             7. Patient will score at least 57% on FOTO to signify clinically meaningful improvement in functional abilities.                       Baseline: see above                                 Goal status: initial    PLAN: PT FREQUENCY: 2x/week   PT DURATION: 8 weeks   PLANNED INTERVENTIONS: Therapeutic exercises, Therapeutic activity, Neuromuscular re-education, Balance training, Gait training, Patient/Family education, Self Care, Stair training, DME instructions, Cryotherapy, Moist heat, Manual therapy, and Re-evaluation.   PLAN FOR NEXT SESSION: transfers  from raised mat height, LE strengthening, gait training, review and update HEP PRN. check [place], PT, DPT, LAT, ATC 12/02/21  11:29 AM Phone: [phone] Fax: [phone]

## 2021-12-02 ENCOUNTER — Other Ambulatory Visit: Payer: Self-pay

## 2021-12-02 ENCOUNTER — Ambulatory Visit: Payer: Medicare Other | Admitting: Physical Therapy

## 2021-12-02 ENCOUNTER — Encounter: Payer: Self-pay | Admitting: Physical Therapy

## 2021-12-02 DIAGNOSIS — M6281 Muscle weakness (generalized): Secondary | ICD-10-CM | POA: Diagnosis not present

## 2021-12-02 DIAGNOSIS — R2689 Other abnormalities of gait and mobility: Secondary | ICD-10-CM

## 2021-12-02 DIAGNOSIS — M5459 Other low back pain: Secondary | ICD-10-CM

## 2021-12-06 NOTE — Therapy (Signed)
OUTPATIENT PHYSICAL THERAPY TREATMENT NOTE   Patient Name: Carol Wilkins MRN: [id number] DOB:11/23/1955, 66 y.o., female Today's Date: 12/07/2021  PCP: Roselee Nova REFERRING PROVIDER: Jessy Oto, MD  END OF SESSION:   PT End of Session - 12/07/21 0929     Visit Number 8    Number of Visits 17    Date for PT Re-Evaluation 01/08/22    Authorization Type UHC MCR    Progress Note Due on Visit 10    PT Start Time 0930    PT Stop Time 1015    PT Time Calculation (min) 45 min    Activity Tolerance Patient tolerated treatment well    Behavior During Therapy WFL for tasks assessed/performed                  Past Medical History:  Diagnosis Date   Anxiety attack    Arthritis    Diabetes mellitus without complication ([place])    type 2 on metformin   High cholesterol    Hypertension    Past Surgical History:  Procedure Laterality Date   ABDOMINAL HYSTERECTOMY     in early 55's   CHOLECYSTECTOMY     in the 90's   TEE WITHOUT CARDIOVERSION N/A 07/14/2015   Procedure: TRANSESOPHAGEAL ECHOCARDIOGRAM (TEE);  Surgeon: Adrian Prows, MD;  Location: [place];  Service: Cardiovascular;  Laterality: N/A;   [place]   Patient Active Problem List   Diagnosis Date Noted   Spinal stenosis, lumbar region with neurogenic claudication    Spondylolisthesis, lumbar region    Fusion of spine of lumbar region 08/31/2021   Colon cancer screening 09/09/2020   Constipation 09/09/2020   Flatulence, eructation and gas pain 09/09/2020   Irritable bowel syndrome 09/09/2020   Personal history of colonic polyps 09/09/2020   Rectal bleeding 09/09/2020   Pes planus 09/09/2020   Posterior tibial tendon dysfunction (PTTD) of both lower extremities 09/09/2020   Hav (hallux abducto valgus), unspecified laterality 09/09/2020   Capsulitis 09/09/2020   Low back pain 01/25/2012   Abdominal pain 11/02/2011   Obesity (BMI 30.0-34.9) 10/31/2011   Post  herpetic neuralgia 09/14/2011   DEPRESSION 12/22/2006   HYPERTENSION 12/22/2006   ALLERGIC RHINITIS 12/22/2006   PALPITATIONS, HX OF 12/22/2006    REFERRING DIAG: Z98.1 (ICD-10-CM) - S/P lumbar fusion M43.16 (ICD-10-CM) - Spondylolisthesis at L3-L4 level M54.16 (ICD-10-CM) - Radiculopathy, lumbar region   THERAPY DIAG:  Muscle weakness (generalized)  Other abnormalities of gait and mobility  Other low back pain  Rationale for Evaluation and Treatment Rehabilitation  PERTINENT HISTORY: s/p L3-4 and L4-5 TLIF 08/31/21  PRECAUTIONS: Back Avoid bending, stooping and twisting  SUBJECTIVE: "I overdid it this weekend and did some bending and lifting at a get together. It's just really sore." She has f/u with surgeon on 12/09/21.    PAIN:  Are you having pain? yes NPRS scale: 6/10 Pain location: Right low back Pain description: sore Aggravating factors: getting up Relieving factors: rest    OBJECTIVE: (objective measures completed at initial evaluation unless otherwise dated) PATIENT SURVEYS:  FOTO 41% (predicted 57%)  11/30/21: 54%   POSTURE: rounded shoulders and forward head   LUMBAR ROM:  Deferred due to post-op acuity and current ROM restrictions  Active  A/PROM  eval  Flexion    Extension    Right lateral flexion    Left lateral flexion    Right rotation    Left rotation     (  Blank rows = not tested)   LOWER EXTREMITY MMT:     MMT Right eval Left eval  Hip flexion 3+ 4-  Hip extension      Hip abduction 3- 3-  Hip adduction      Hip internal rotation      Hip external rotation      Knee flexion 5 5  Knee extension 5 5  Ankle dorsiflexion 4 4  Ankle plantarflexion      Ankle inversion      Ankle eversion       (Blank rows = not tested)   FUNCTIONAL TESTS:                        SLS: unable 12/07/21: LLE 3 seconds; RLE 1 second  5 X STS: 28.3 seconds with BUE support TUG: 35 seconds  11/18/21: 26 seconds with SPC  12/07/21: 23.6 seconds with SPC     Sit <> stand: requires cues for proper hand placement (continues to put hands on RW) Sit to supine: Min A  Supine to Sit: Mod A  11/22/21: Sit <>supine: Mod I (increased time)   GAIT: - assessed 12/02/2021 Distance walked: - Assistive device utilized: SPC Level of assistance: Modified independence Comments: slow gait speed      TODAY'S TREATMENT  [facility] Adult PT Treatment:                                                DATE: 12/07/21 Therapeutic Exercise: NuStep level 5 x 5 minutes Figure 4 stretch 2 x 30 sec each  Supine posterior pelvic tilts 2 x 10  Prone hip extension 2 x 10  Prone hamstring curl 2 x 10 green band  LAQ 2 x 10 @ 2 #  [facility] Adult PT Treatment:                                                DATE: 12/02/2021 Therapeutic Exercise: NuStep level L5 x 5 minutes with UE/LE while taking subjective SLR 2 x 10 each Bridge with adductor ball squeeze 2 x 10 - partial range Sidelying hip abduction 2 x 10 each Sit to stand 2 x 10 Standing heel raises 2 x 15 Standing hip extension 2 x 10 each Standing alternating march 2 x 20 Forward step-up 4" 2 x 10 each   [facility] Adult PT Treatment:                                                DATE: 11/30/2021 Therapeutic Exercise: NuStep level L5 x 5 minutes with UE/LE while taking subjective Figure 4 stretch 3 x 15 sec each Posterior pelvic tilt 10 x 5 sec SLR 2 x 10 Sidelying hip abduction 2 x 10; partial range  STS 10 x 2    PATIENT EDUCATION:  Education details:discuss precautions at upcoming MD appointment  Person educated: Patient Education method: instruction  Education comprehension: Verbalized understanding   HOME EXERCISE PROGRAM: Access Code: [email or web address]    ASSESSMENT: CLINICAL IMPRESSION: Patient reports an increase in back soreness upon  arrival, attributing it to completing bending and light-lifting over the weekend. Given her increased soreness today's session focused on gentle mobility and light progression of LE  and core strengthening.Able to introduce prone activity with patient initially hesitant to roll onto her stomach. With cueing for proper technique she is able to achieve prone positioning without any increased discomfort noted while performing this transfer or while completing strengthening exercises in this position. She has met all short term functional goals with exception of SL balance goal, as she has difficulty maintaining SLS on the RLE at this time. She reported a reduction in back soreness at end of session rated as 4/10.      OBJECTIVE IMPAIRMENTS Abnormal gait, decreased activity tolerance, decreased balance, decreased endurance, decreased knowledge of use of DME, decreased mobility, difficulty walking, decreased ROM, decreased strength, improper body mechanics, postural dysfunction, and pain.    ACTIVITY LIMITATIONS carrying, lifting, bending, sitting, standing, squatting, stairs, transfers, bed mobility, bathing, toileting, and locomotion level   PARTICIPATION LIMITATIONS: meal prep, cleaning, laundry, driving, shopping, community activity, and yard work   PERSONAL FACTORS Age and Fitness are also affecting patient's functional outcome.      GOALS: Goals reviewed with patient? Yes   SHORT TERM GOALS: Target date: 12/09/2021   Patient will require no cues for proper hand placement when performing sit to stand to improve safety with transfers.  Baseline: constant cues to not push from the RW; 11/22/21 unable to recall proper placement; 12/07/21: able to safely transfer with her [facility] and recall hand positioning   Goal status: met    2.  Patient will be Mod I with supine <>sit transfer.  Baseline:  Goal status: met    3.  Patient will maintain SLS for at least 2 seconds bilaterally to improve gait stability.  Baseline:  Goal status: partially met    4.  Patient will complete TUG in </=25 seconds to signify a reduction in fall risk  Baseline:  Goal status: met     LONG TERM  GOALS: Target date: 01/08/2022   Patient will demonstrate proper bending and lifting mechanics to reduce stress on her back.  Baseline: unable  Goal status: INITIAL   2.  Patient will ambulate community and household distances with LRAD.  Baseline:  Goal status: INITIAL   3.  Patient will complete 5 x STS in </= 20 seconds to improve her functional strength.   Baseline:  Goal status: INITIAL   4.  Patient will demonstrate 4/5 hip flexor strength to improve ability to negotiate stairs and curbs. Baseline: see above  Goal status: INITIAL   5.  Patient will demonstrate at least 4-/5 bilateral hip abductor strength to improve stability about the chain with walking activity.  Baseline: see above  Goal status: INITIAL   6.  Patient will tolerate at least 30 minutes of standing activity in order to cook for her family.  Baseline: see above  Goal status: INITIAL             7. Patient will score at least 57% on FOTO to signify clinically meaningful improvement in functional abilities.                       Baseline: see above                                 Goal status: initial    PLAN: PT  FREQUENCY: 2x/week   PT DURATION: 8 weeks   PLANNED INTERVENTIONS: Therapeutic exercises, Therapeutic activity, Neuromuscular re-education, Balance training, Gait training, Patient/Family education, Self Care, Stair training, DME instructions, Cryotherapy, Moist heat, Manual therapy, and Re-evaluation.   PLAN FOR NEXT SESSION: , LE strengthening, gait training, review and update HEP PRN.   Gwendolyn Grant, PT, DPT, ATC 12/07/21 10:16 AM

## 2021-12-07 ENCOUNTER — Ambulatory Visit: Payer: Medicare Other

## 2021-12-07 DIAGNOSIS — M5459 Other low back pain: Secondary | ICD-10-CM

## 2021-12-07 DIAGNOSIS — M6281 Muscle weakness (generalized): Secondary | ICD-10-CM

## 2021-12-07 DIAGNOSIS — R2689 Other abnormalities of gait and mobility: Secondary | ICD-10-CM

## 2021-12-09 ENCOUNTER — Ambulatory Visit (INDEPENDENT_AMBULATORY_CARE_PROVIDER_SITE_OTHER): Payer: Medicare Other

## 2021-12-09 ENCOUNTER — Encounter: Payer: Self-pay | Admitting: Specialist

## 2021-12-09 ENCOUNTER — Ambulatory Visit (INDEPENDENT_AMBULATORY_CARE_PROVIDER_SITE_OTHER): Payer: Medicare Other | Admitting: Specialist

## 2021-12-09 VITALS — BP 148/66 | HR 61 | Ht 62.0 in | Wt 176.0 lb

## 2021-12-09 DIAGNOSIS — Z981 Arthrodesis status: Secondary | ICD-10-CM

## 2021-12-09 DIAGNOSIS — M4316 Spondylolisthesis, lumbar region: Secondary | ICD-10-CM

## 2021-12-09 DIAGNOSIS — M1611 Unilateral primary osteoarthritis, right hip: Secondary | ICD-10-CM

## 2021-12-09 NOTE — Patient Instructions (Signed)
Avoid frequent bending and stooping  No lifting greater than 10 lbs. May use ice or moist heat for pain. Weight loss is of benefit. Best medication for lumbar disc disease is arthritis medications like motrin, celebrex and naprosyn. Exercise is important to improve your indurance and does allow people to function better inspite of back pain. Therapy to work on ROM and core motor strengthening, hamstring stretching  Referral to Dr. Ninfa Linden for treatment of OA of the right hip.

## 2021-12-09 NOTE — Progress Notes (Signed)
Post-Op Visit Note   Patient: Carol Wilkins           Date of Birth: [birth year]           MRN: [id number] Visit Date: 12/09/2021 PCP: Roselee Nova, MD   Assessment & Plan:14.5 weeks post op L3-4 and L4-5 TLIFs for spondylolisthesis and spinal stenosis.   Chief Complaint:  Chief Complaint  Patient presents with   Lower Back - Follow-up  No pain meds now No bowel or bladder difficulty Motor both legs normal Radiographs done today to assess for movement at fusion site.  Visit Diagnoses:  1. S/P lumbar fusion   2. Spondylolisthesis at L3-L4 level   3. Unilateral primary osteoarthritis, right hip     Plan: Avoid frequent bending and stooping  No lifting greater than 10 lbs. May use ice or moist heat for pain. Weight loss is of benefit. Best medication for lumbar disc disease is arthritis medications like motrin, celebrex and naprosyn. Exercise is important to improve your indurance and does allow people to function better inspite of back pain.  Referral to Dr. Ninfa Linden for eval and treatment of right hip OA  Follow-Up Instructions: Return in about 6 weeks (around 01/20/2022) for Dr. Ninfa Linden for right hip osteoarthritis, me in 6 weeks..   Orders:  Orders Placed This Encounter  Procedures   XR Lumbar Spine 2-3 Views   Ambulatory referral to Physical Therapy   No orders of the defined types were placed in this encounter.   Imaging: XR Lumbar Spine 2-3 Views  Result Date: 12/09/2021 AP and lateral flexion and extension radiographs of the lumbar spine show cages and pedicle screws and rods fixing the L3-4 and L4-5 levels with bending and extension there is 1 mm or less difference measured across the anterior Aspect of the fusion sites. This suggests that fusion is occurring.    PMFS History: Patient Active Problem List   Diagnosis Date Noted   Spinal stenosis, lumbar region with neurogenic claudication    Spondylolisthesis, lumbar region    Fusion of spine of  lumbar region 08/31/2021   Colon cancer screening 09/09/2020   Constipation 09/09/2020   Flatulence, eructation and gas pain 09/09/2020   Irritable bowel syndrome 09/09/2020   Personal history of colonic polyps 09/09/2020   Rectal bleeding 09/09/2020   Pes planus 09/09/2020   Posterior tibial tendon dysfunction (PTTD) of both lower extremities 09/09/2020   Hav (hallux abducto valgus), unspecified laterality 09/09/2020   Capsulitis 09/09/2020   Low back pain 01/25/2012   Abdominal pain 11/02/2011   Obesity (BMI 30.0-34.9) 10/31/2011   Post herpetic neuralgia 09/14/2011   DEPRESSION 12/22/2006   HYPERTENSION 12/22/2006   ALLERGIC RHINITIS 12/22/2006   PALPITATIONS, HX OF 12/22/2006   Past Medical History:  Diagnosis Date   Anxiety attack    Arthritis    Diabetes mellitus without complication ([place])    type 2 on metformin   High cholesterol    Hypertension     Family History  Problem Relation Age of Onset   Heart disease Mother    Diabetes Mother    Hypertension Mother    Diabetes Father    Hypertension Father    Diabetes Sister    Hypertension Sister    Heart disease Sister    Diabetes Brother    Hypertension Brother    Heart disease Brother     Past Surgical History:  Procedure Laterality Date   ABDOMINAL HYSTERECTOMY     in early 15's  CHOLECYSTECTOMY     in the 90's   TEE WITHOUT CARDIOVERSION N/A 07/14/2015   Procedure: TRANSESOPHAGEAL ECHOCARDIOGRAM (TEE);  Surgeon: Adrian Prows, MD;  Location: [facility] ENDOSCOPY;  Service: Cardiovascular;  Laterality: N/A;   [place]   Social History   Occupational History   Not on file  Tobacco Use   Smoking status: Former    Types: Cigarettes    Quit date: 09/13/1980    Years since quitting: 41.2   Smokeless tobacco: Never  Vaping Use   Vaping Use: Never used  Substance and Sexual Activity   Alcohol use: No   Drug use: No   Sexual activity: Yes    Partners: Male    Birth  control/protection: Surgical

## 2021-12-10 ENCOUNTER — Ambulatory Visit: Payer: Medicare Other | Admitting: Physical Therapy

## 2021-12-10 ENCOUNTER — Encounter: Payer: Self-pay | Admitting: Physical Therapy

## 2021-12-10 DIAGNOSIS — M5459 Other low back pain: Secondary | ICD-10-CM

## 2021-12-10 DIAGNOSIS — M6281 Muscle weakness (generalized): Secondary | ICD-10-CM

## 2021-12-10 DIAGNOSIS — R2689 Other abnormalities of gait and mobility: Secondary | ICD-10-CM

## 2021-12-10 NOTE — Therapy (Signed)
OUTPATIENT PHYSICAL THERAPY TREATMENT NOTE   Patient Name: Carol Wilkins MRN: [id number] DOB:09/10/1955, 66 y.o., female Today's Date: 12/10/2021  PCP: Roselee Nova REFERRING PROVIDER: Jessy Oto, MD  END OF SESSION:   PT End of Session - 12/10/21 0935     Visit Number 9    Number of Visits 17    Date for PT Re-Evaluation 01/08/22    Authorization Type [employer] MCR    Progress Note Due on Visit 10    PT Start Time 0931    PT Stop Time 1015    PT Time Calculation (min) 44 min                  Past Medical History:  Diagnosis Date   Anxiety attack    Arthritis    Diabetes mellitus without complication ([place])    type 2 on metformin   High cholesterol    Hypertension    Past Surgical History:  Procedure Laterality Date   ABDOMINAL HYSTERECTOMY     in early 24's   CHOLECYSTECTOMY     in the 90's   TEE WITHOUT CARDIOVERSION N/A 07/14/2015   Procedure: TRANSESOPHAGEAL ECHOCARDIOGRAM (TEE);  Surgeon: Adrian Prows, MD;  Location: [facility];  Service: Cardiovascular;  Laterality: N/A;   [place]   Patient Active Problem List   Diagnosis Date Noted   Spinal stenosis, lumbar region with neurogenic claudication    Spondylolisthesis, lumbar region    Fusion of spine of lumbar region 08/31/2021   Colon cancer screening 09/09/2020   Constipation 09/09/2020   Flatulence, eructation and gas pain 09/09/2020   Irritable bowel syndrome 09/09/2020   Personal history of colonic polyps 09/09/2020   Rectal bleeding 09/09/2020   Pes planus 09/09/2020   Posterior tibial tendon dysfunction (PTTD) of both lower extremities 09/09/2020   Hav (hallux abducto valgus), unspecified laterality 09/09/2020   Capsulitis 09/09/2020   Low back pain 01/25/2012   Abdominal pain 11/02/2011   Obesity (BMI 30.0-34.9) 10/31/2011   Post herpetic neuralgia 09/14/2011   DEPRESSION 12/22/2006   HYPERTENSION 12/22/2006   ALLERGIC RHINITIS 12/22/2006    PALPITATIONS, HX OF 12/22/2006    REFERRING DIAG: Z98.1 (ICD-10-CM) - S/P lumbar fusion M43.16 (ICD-10-CM) - Spondylolisthesis at L3-L4 level M54.16 (ICD-10-CM) - Radiculopathy, lumbar region   THERAPY DIAG:  Muscle weakness (generalized)  Other abnormalities of gait and mobility  Other low back pain  Rationale for Evaluation and Treatment Rehabilitation  PERTINENT HISTORY: s/p L3-4 and L4-5 TLIF 08/31/21  PRECAUTIONS: Back Avoid bending, stooping and twisting: Update 12/10/21: Wean from brace 1/2 days in brace for 2 weeks then discontinue May start progressive ROM of the lumbar spine with core strengthening exercises, no extreme stretching or flexibility training as healing is progressive over the year following the lumbar fusion. LE strengthening and hamstring stretching, aerobic exercise.  SUBJECTIVE: " I saw the MD yesterday. I should wear the brace half a day and I go back in 6 weeks. He sent new PT orders for  gentle stretching, and core and leg strengthening.    PAIN:  Are you having pain? yes NPRS scale: 2/10 Pain location: Right low back Pain description: achy Aggravating factors: getting up Relieving factors: rest    OBJECTIVE: (objective measures completed at initial evaluation unless otherwise dated) PATIENT SURVEYS:  FOTO 41% (predicted 57%)  11/30/21: 54%   POSTURE: rounded shoulders and forward head   LUMBAR ROM:  Deferred due to  post-op acuity and current ROM restrictions  Active  A/PROM  eval  Flexion    Extension    Right lateral flexion    Left lateral flexion    Right rotation    Left rotation     (Blank rows = not tested)   LOWER EXTREMITY MMT:     MMT Right eval Left eval  Hip flexion 3+ 4-  Hip extension      Hip abduction 3- 3-  Hip adduction      Hip internal rotation      Hip external rotation      Knee flexion 5 5  Knee extension 5 5  Ankle dorsiflexion 4 4  Ankle plantarflexion      Ankle inversion      Ankle eversion        (Blank rows = not tested)   FUNCTIONAL TESTS:                        SLS: unable 12/07/21: LLE 3 seconds; RLE 1 second  5 X STS: 28.3 seconds with BUE support TUG: 35 seconds  11/18/21: 26 seconds with SPC  12/07/21: 23.6 seconds with SPC    Sit <> stand: requires cues for proper hand placement (continues to put hands on RW) Sit to supine: Min A  Supine to Sit: Mod A  11/22/21: Sit <>supine: Mod I (increased time)   GAIT: - assessed 12/02/2021 Distance walked: - Assistive device utilized: SPC Level of assistance: Modified independence Comments: slow gait speed      TODAY'S TREATMENT  [facility] Adult PT Treatment:                                                DATE: 12/10/2021 Therapeutic Exercise: NuStep level L5 x 5 minutes with UE/LE while taking subjective Standing hip abduction yellow x 10 each Standing hip extension yellow x 10 each  Standing heel raises 2 x 15 Gastroc stretch at counter x 30 sec each STS from bariatric chair x 10 without UE LTR in comfortable ROM Gentle SKTC Hamstring stretch with strap    [facility] Adult PT Treatment:                                                DATE: 12/07/21 Therapeutic Exercise: NuStep level 5 x 5 minutes Figure 4 stretch 2 x 30 sec each  Supine posterior pelvic tilts 2 x 10  Prone hip extension 2 x 10  Prone hamstring curl 2 x 10 green band  LAQ 2 x 10 @ 2 #  [facility] Adult PT Treatment:                                                DATE: 12/02/2021 Therapeutic Exercise: NuStep level L5 x 5 minutes with UE/LE while taking subjective SLR 2 x 10 each Bridge with adductor ball squeeze 2 x 10 - partial range Sidelying hip abduction 2 x 10 each Sit to stand 2 x 10 Standing heel raises 2 x 15 Standing hip extension 2 x 10 each  Standing alternating march 2 x 20 Forward step-up 4" 2 x 10 each   [facility] Adult PT Treatment:                                                DATE: 11/30/2021 Therapeutic Exercise: NuStep level L5 x 5 minutes with UE/LE  while taking subjective Figure 4 stretch 3 x 15 sec each Posterior pelvic tilt 10 x 5 sec SLR 2 x 10 Sidelying hip abduction 2 x 10; partial range  STS 10 x 2    PATIENT EDUCATION:  Education details:discuss precautions at upcoming MD appointment  Person educated: Patient Education method: instruction  Education comprehension: Verbalized understanding   HOME EXERCISE PROGRAM: Access Code: [email or web address] URL: [email or web address] Date: 12/10/2021 Prepared by: Hessie Diener  Exercises - Seated March  - 2 x daily - 7 x weekly - 2 sets - 10 reps - Seated Long Arc Quad  - 2 x daily - 7 x weekly - 2 sets - 10 reps - Heel Raises with Counter Support  - 2 x daily - 7 x weekly - 2 sets - 10 reps - Supine Bridge  - 1 x daily - 7 x weekly - 2 sets - 10 reps - Small Range Straight Leg Raise  - 1 x daily - 7 x weekly - 2 sets - 10 reps - Hooklying Clamshell with Resistance  - 1 x daily - 7 x weekly - 2 sets - 10 reps - Supine Lower Trunk Rotation  - 1 x daily - 7 x weekly - 1 sets - 5 reps - 5-10 hold - Hooklying Single Knee to Chest  - 1 x daily - 7 x weekly - 1 sets - 2-3 reps - 30 hold - Hooklying Hamstring Stretch with Strap  - 1 x daily - 7 x weekly - 1 sets - 2-3 reps - 30 hold    ASSESSMENT: CLINICAL IMPRESSION: Patient reports MD is allowing her to wean from brace 1/2 days. She arrives without lumbar brace to treatment. Per MD new orders :Wean from brace 1/2 days in brace for 2 weeks then discontinue May start progressive ROM of the lumbar spine with core strengthening exercises, no extreme stretching or flexibility training as healing is progressive over the year following the lumbar fusion. LE strengthening and hamstring stretching, aerobic exercise.  Continued LE strengthening with light resistance today. Began gentle stretches including runners stretch, SKTC, LTR, and hamstring stretches. She tolerated the session well with reports of gentle pulling during stretches. She  was given HEP with stretches.     OBJECTIVE IMPAIRMENTS Abnormal gait, decreased activity tolerance, decreased balance, decreased endurance, decreased knowledge of use of DME, decreased mobility, difficulty walking, decreased ROM, decreased strength, improper body mechanics, postural dysfunction, and pain.    ACTIVITY LIMITATIONS carrying, lifting, bending, sitting, standing, squatting, stairs, transfers, bed mobility, bathing, toileting, and locomotion level   PARTICIPATION LIMITATIONS: meal prep, cleaning, laundry, driving, shopping, community activity, and yard work   PERSONAL FACTORS Age and Fitness are also affecting patient's functional outcome.      GOALS: Goals reviewed with patient? Yes   SHORT TERM GOALS: Target date: 12/09/2021   Patient will require no cues for proper hand placement when performing sit to stand to improve safety with transfers.  Baseline: constant cues to not push from the RW; 11/22/21 unable to recall proper  placement; 12/07/21: able to safely transfer with her [facility] and recall hand positioning   Goal status: met    2.  Patient will be Mod I with supine <>sit transfer.  Baseline:  Goal status: met    3.  Patient will maintain SLS for at least 2 seconds bilaterally to improve gait stability.  Baseline:  Goal status: partially met    4.  Patient will complete TUG in </=25 seconds to signify a reduction in fall risk  Baseline:  Goal status: met     LONG TERM GOALS: Target date: 01/08/2022   Patient will demonstrate proper bending and lifting mechanics to reduce stress on her back.  Baseline: unable  Goal status: INITIAL   2.  Patient will ambulate community and household distances with LRAD.  Baseline:  Goal status: INITIAL   3.  Patient will complete 5 x STS in </= 20 seconds to improve her functional strength.   Baseline:  Goal status: INITIAL   4.  Patient will demonstrate 4/5 hip flexor strength to improve ability to negotiate stairs and  curbs. Baseline: see above  Goal status: INITIAL   5.  Patient will demonstrate at least 4-/5 bilateral hip abductor strength to improve stability about the chain with walking activity.  Baseline: see above  Goal status: INITIAL   6.  Patient will tolerate at least 30 minutes of standing activity in order to cook for her family.  Baseline: see above  Goal status: INITIAL             7. Patient will score at least 57% on FOTO to signify clinically meaningful improvement in functional abilities.                       Baseline: see above                                 Goal status: initial    PLAN: PT FREQUENCY: 2x/week   PT DURATION: 8 weeks   PLANNED INTERVENTIONS: Therapeutic exercises, Therapeutic activity, Neuromuscular re-education, Balance training, Gait training, Patient/Family education, Self Care, Stair training, DME instructions, Cryotherapy, Moist heat, Manual therapy, and Re-evaluation.   PLAN FOR NEXT SESSION: , Per MD 12/09/21: Rich Brave from brace 1/2 days in brace for 2 weeks then discontinue May start progressive ROM of the lumbar spine with core strengthening exercises, no extreme stretching or flexibility training as healing is progressive over the year following the lumbar fusion. LE strengthening and hamstring stretching, aerobic exercise.  Hessie Diener, PTA 12/10/21 11:17 AM Phone: [phone] Fax: [phone]

## 2021-12-13 ENCOUNTER — Encounter: Payer: Self-pay | Admitting: Physical Therapy

## 2021-12-13 ENCOUNTER — Ambulatory Visit: Payer: Medicare Other | Admitting: Physical Therapy

## 2021-12-13 DIAGNOSIS — R2689 Other abnormalities of gait and mobility: Secondary | ICD-10-CM

## 2021-12-13 DIAGNOSIS — M5459 Other low back pain: Secondary | ICD-10-CM

## 2021-12-13 DIAGNOSIS — M6281 Muscle weakness (generalized): Secondary | ICD-10-CM | POA: Diagnosis not present

## 2021-12-13 NOTE — Therapy (Signed)
OUTPATIENT PHYSICAL THERAPY TREATMENT NOTE  Progress Note Reporting Period 11/11/21 to 12/13/21  See note below for Objective Data and Assessment of Progress/Goals.     Patient Name: Carol Wilkins MRN: [id number] DOB:08/02/1955, 66 y.o., female Today's Date: 12/14/2021  PCP: Shah, Syed Asad A REFERRING PROVIDER: Nitka, James E, MD  END OF SESSION:   PT End of Session - 12/13/21 0935     Visit Number 10    Number of Visits 17    Date for PT Re-Evaluation 01/08/22    Authorization Type U[employer] MCR    Progress Note Due on Visit 20    PT Start Time 0931    PT Stop Time 1015    PT Time Calculation (min) 44 min                  Past Medical History:  Diagnosis Date   Anxiety attack    Arthritis    Diabetes mellitus without complication ([facility])    type 2 on metformin   High cholesterol    Hypertension    Past Surgical History:  Procedure Laterality Date   ABDOMINAL HYSTERECTOMY     in early 90's   CHOLECYSTECTOMY     in the 90's   TEE WITHOUT CARDIOVERSION N/A 07/14/2015   Procedure: TRANSESOPHAGEAL ECHOCARDIOGRAM (TEE);  Surgeon: Jay Ganji, MD;  Location: [facility];  Service: Cardiovascular;  Laterality: N/A;   TUBAL LIGATION     TUBAL LIGATION  1980   Patient Active Problem List   Diagnosis Date Noted   Spinal stenosis, lumbar region with neurogenic claudication    Spondylolisthesis, lumbar region    Fusion of spine of lumbar region 08/31/2021   Colon cancer screening 09/09/2020   Constipation 09/09/2020   Flatulence, eructation and gas pain 09/09/2020   Irritable bowel syndrome 09/09/2020   Personal history of colonic polyps 09/09/2020   Rectal bleeding 09/09/2020   Pes planus 09/09/2020   Posterior tibial tendon dysfunction (PTTD) of both lower extremities 09/09/2020   Hav (hallux abducto valgus), unspecified laterality 09/09/2020   Capsulitis 09/09/2020   Low back pain 01/25/2012   Abdominal pain 11/02/2011   Obesity (BMI 30.0-34.9) 10/31/2011    Post herpetic neuralgia 09/14/2011   DEPRESSION 12/22/2006   HYPERTENSION 12/22/2006   ALLERGIC RHINITIS 12/22/2006   PALPITATIONS, HX OF 12/22/2006    REFERRING DIAG: Z98.1 (ICD-10-CM) - S/P lumbar fusion M43.16 (ICD-10-CM) - Spondylolisthesis at L3-L4 level M54.16 (ICD-10-CM) - Radiculopathy, lumbar region   THERAPY DIAG:  Muscle weakness (generalized)  Other abnormalities of gait and mobility  Other low back pain  Rationale for Evaluation and Treatment Rehabilitation  PERTINENT HISTORY: s/p L3-4 and L4-5 TLIF 08/31/21  PRECAUTIONS: Back Avoid bending, stooping and twisting: Update 12/10/21: Wean from brace 1/2 days in brace for 2 weeks then discontinue May start progressive ROM of the lumbar spine with core strengthening exercises, no extreme stretching or flexibility training as healing is progressive over the year following the lumbar fusion. LE strengthening and hamstring stretching, aerobic exercise.  SUBJECTIVE: " I have a slight ache in my back today. The new stretches are going well. I am wearing the brace on and off."    PAIN:  Are you having pain? yes NPRS scale: 3/10 Pain location: Right low back Pain description: achy Aggravating factors: getting up Relieving factors: rest    OBJECTIVE: (objective measures completed at initial evaluation unless otherwise dated) PATIENT SURVEYS:  FOTO 41% (predicted 57%)  11/30/21: 54% 12/13/21: 60%   POSTURE: rounded   shoulders and forward head   LUMBAR ROM:  Deferred due to post-op acuity and current ROM restrictions  Active  A/PROM  eval  Flexion    Extension    Right lateral flexion    Left lateral flexion    Right rotation    Left rotation     (Blank rows = not tested)   LOWER EXTREMITY MMT:     MMT Right eval Left eval  Hip flexion 3+ 4-  Hip extension      Hip abduction 3- 3-  Hip adduction      Hip internal rotation      Hip external rotation      Knee flexion 5 5  Knee extension 5 5  Ankle  dorsiflexion 4 4  Ankle plantarflexion      Ankle inversion      Ankle eversion       (Blank rows = not tested)   FUNCTIONAL TESTS:                        SLS: unable 12/07/21: LLE 3 seconds; RLE 1 second : 12/13/21: 3 sec best bilat  5 X STS: 28.3 seconds with BUE support: 12/13/21 : 25.3 sec without UE TUG: 35 seconds  11/18/21: 26 seconds with SPC  12/07/21: 23.6 seconds with SPC  12/13/21: 23.0 secounds with SPC   Sit <> stand: requires cues for proper hand placement (continues to put hands on RW) Sit to supine: Min A  Supine to Sit: Mod A  11/22/21: Sit <>supine: Mod I (increased time)   GAIT: - assessed 12/02/2021 Distance walked: - Assistive device utilized: SPC Level of assistance: Modified independence Comments: slow gait speed      TODAY'S TREATMENT  [facility] Adult PT Treatment:                                                DATE: 12/13/2021 Therapeutic Exercise: NuStep level L5 x 5 minutes with UE/LE while taking subjective SLS 3 sec best bilateral  Standing heel raises x 20 Gastroc stretch at counter x 30 sec each x 2 Standing hip abduction yellow x 10 each Standing hip extension yellow x 10 each  5 x STS 25.3 sec without UE STS from bariatric chair x 10 without UE TUG 23 sec LTR in comfortable ROM Gentle SKTC Hamstring stretch with strap   [facility] Adult PT Treatment:                                                DATE: 12/10/2021 Therapeutic Exercise: NuStep level L5 x 5 minutes with UE/LE while taking subjective Standing hip abduction yellow x 10 each Standing hip extension yellow x 10 each  Standing heel raises 2 x 15 Gastroc stretch at counter x 30 sec each STS from bariatric chair x 10 without UE LTR in comfortable ROM Gentle SKTC Hamstring stretch with strap    [facility] Adult PT Treatment:                                                  DATE: 12/07/21 Therapeutic Exercise: NuStep level 5 x 5 minutes Figure 4 stretch 2 x 30 sec each  Supine posterior pelvic tilts 2  x 10  Prone hip extension 2 x 10  Prone hamstring curl 2 x 10 green band  LAQ 2 x 10 @ 2 #     PATIENT EDUCATION:  Education details:discuss precautions at upcoming MD appointment  Person educated: Patient Education method: instruction  Education comprehension: Verbalized understanding   HOME EXERCISE PROGRAM: Access Code: [email or web address] URL: [email or web address] Date: 12/10/2021 Prepared by: Hessie Diener  Exercises - Seated March  - 2 x daily - 7 x weekly - 2 sets - 10 reps - Seated Long Arc Quad  - 2 x daily - 7 x weekly - 2 sets - 10 reps - Heel Raises with Counter Support  - 2 x daily - 7 x weekly - 2 sets - 10 reps - Supine Bridge  - 1 x daily - 7 x weekly - 2 sets - 10 reps - Small Range Straight Leg Raise  - 1 x daily - 7 x weekly - 2 sets - 10 reps - Hooklying Clamshell with Resistance  - 1 x daily - 7 x weekly - 2 sets - 10 reps - Supine Lower Trunk Rotation  - 1 x daily - 7 x weekly - 1 sets - 5 reps - 5-10 hold - Hooklying Single Knee to Chest  - 1 x daily - 7 x weekly - 1 sets - 2-3 reps - 30 hold - Hooklying Hamstring Stretch with Strap  - 1 x daily - 7 x weekly - 1 sets - 2-3 reps - 30 hold    ASSESSMENT: CLINICAL IMPRESSION: Mrs Andre has met all STGs and also LTG# 2 and #7. She is ambulating with SPC in community and can remain standing up to 15-20 minutes in her home for ADLs. Her FOTO score, SLS and TUG have all improved. She continues to wean from back brace per MD recommendation on 12/10/21. Reviewed new stretches added last visit and she demonstrates independence and comfort with these. Continued LE strengthening with light resistance today. She tolerated the session well with reports of gentle pulling during stretches, especially runners stretch. She will benefit from continued skilled physical therapy to address remaining deficits for completion of LTGs.      OBJECTIVE IMPAIRMENTS Abnormal gait, decreased activity tolerance, decreased balance,  decreased endurance, decreased knowledge of use of DME, decreased mobility, difficulty walking, decreased ROM, decreased strength, improper body mechanics, postural dysfunction, and pain.    ACTIVITY LIMITATIONS carrying, lifting, bending, sitting, standing, squatting, stairs, transfers, bed mobility, bathing, toileting, and locomotion level   PARTICIPATION LIMITATIONS: meal prep, cleaning, laundry, driving, shopping, community activity, and yard work   PERSONAL FACTORS Age and Fitness are also affecting patient's functional outcome.      GOALS: Goals reviewed with patient? Yes   SHORT TERM GOALS: Target date: 12/09/2021   Patient will require no cues for proper hand placement when performing sit to stand to improve safety with transfers.  Baseline: constant cues to not push from the RW; 11/22/21 unable to recall proper placement; 12/07/21: able to safely transfer with her [facility] and recall hand positioning   Goal status: MET   2.  Patient will be Mod I with supine <>sit transfer.  Baseline:  Goal status: MET   3.  Patient will maintain SLS for at least 2 seconds bilaterally to improve gait stability.  Baseline: unable 12/13/21: able to hold SLS for  3 sec bilaterally  Goal status: MET   4.  Patient will complete TUG in </=25 seconds to signify a reduction in fall risk  Baseline:  Goal status: MET     LONG TERM GOALS: Target date: 01/08/2022   Patient will demonstrate proper bending and lifting mechanics to reduce stress on her back.  Baseline: unable  Status:12/13/21:  Is weaning from back brace  Goal status: ONGOING   2.  Patient will ambulate community and household distances with LRAD.  Baseline:  Status: 12/13/21: uses SPC at grocery store , mostly no AD use in home  Goal status: MET   3.  Patient will complete 5 x STS in </= 20 seconds to improve her functional strength.   Baseline:  Goal status: ONGOING   4.  Patient will demonstrate 4/5 hip flexor strength to improve  ability to negotiate stairs and curbs. Baseline: R 3+/5, L 4-/5 Goal status: ONGOING   5.  Patient will demonstrate at least 4-/5 bilateral hip abductor strength to improve stability about the chain with walking activity.  Baseline: 3-/5 bilateral Goal status: ONGOING   6.  Patient will tolerate at least 30 minutes of standing activity in order to cook for her family.  Baseline: see above  Status: 12/13/21: can tolerate 15-20 minutes  Goal status: ONGOING             7. Patient will score at least 57% on FOTO to signify clinically meaningful improvement in functional abilities.                       Baseline: 41% Status: 11/30/21: 54%        Status: 12/13/21: 60%                      Goal status: MET   PLAN: PT FREQUENCY: 2x/week   PT DURATION: 8 weeks   PLANNED INTERVENTIONS: Therapeutic exercises, Therapeutic activity, Neuromuscular re-education, Balance training, Gait training, Patient/Family education, Self Care, Stair training, DME instructions, Cryotherapy, Moist heat, Manual therapy, and Re-evaluation.   PLAN FOR NEXT SESSION: Begin gentle core strength:  Per MD 12/09/21: [place] Brave from brace 1/2 days in brace for 2 weeks then discontinue May start progressive ROM of the lumbar spine with core strengthening exercises, no extreme stretching or flexibility training as healing is progressive over the year following the lumbar fusion. LE strengthening and hamstring stretching, aerobic exercise.   Hessie Diener, PTA 12/14/21 8:40 AM Phone: [phone] Fax: [phone]   Gwendolyn Grant, PT, DPT, ATC 12/14/21 8:40 AM

## 2021-12-15 ENCOUNTER — Encounter: Payer: Self-pay | Admitting: Physical Therapy

## 2021-12-15 ENCOUNTER — Ambulatory Visit: Payer: Medicare Other | Admitting: Physical Therapy

## 2021-12-15 DIAGNOSIS — M5459 Other low back pain: Secondary | ICD-10-CM

## 2021-12-15 DIAGNOSIS — M6281 Muscle weakness (generalized): Secondary | ICD-10-CM

## 2021-12-15 DIAGNOSIS — R2689 Other abnormalities of gait and mobility: Secondary | ICD-10-CM

## 2021-12-15 NOTE — Therapy (Signed)
OUTPATIENT PHYSICAL THERAPY TREATMENT NOTE      Patient Name: Carol Wilkins MRN: [id number] DOB:[birth year], 66 y.o., female Today's Date: 12/15/2021  PCP: Roselee Nova REFERRING PROVIDER: Jessy Oto, MD  END OF SESSION:   PT End of Session - 12/15/21 0934     Visit Number 11    Number of Visits 17    Date for PT Re-Evaluation 01/08/22    Authorization Type U[employer] MCR    Progress Note Due on Visit 20    PT Start Time 0931    PT Stop Time 1010    PT Time Calculation (min) 39 min                  Past Medical History:  Diagnosis Date   Anxiety attack    Arthritis    Diabetes mellitus without complication ([place])    type 2 on metformin   High cholesterol    Hypertension    Past Surgical History:  Procedure Laterality Date   ABDOMINAL HYSTERECTOMY     in early 60's   CHOLECYSTECTOMY     in the 90's   TEE WITHOUT CARDIOVERSION N/A 07/14/2015   Procedure: TRANSESOPHAGEAL ECHOCARDIOGRAM (TEE);  Surgeon: Adrian Prows, MD;  Location: [facility];  Service: Cardiovascular;  Laterality: N/A;   [place]   Patient Active Problem List   Diagnosis Date Noted   Spinal stenosis, lumbar region with neurogenic claudication    Spondylolisthesis, lumbar region    Fusion of spine of lumbar region 08/31/2021   Colon cancer screening 09/09/2020   Constipation 09/09/2020   Flatulence, eructation and gas pain 09/09/2020   Irritable bowel syndrome 09/09/2020   Personal history of colonic polyps 09/09/2020   Rectal bleeding 09/09/2020   Pes planus 09/09/2020   Posterior tibial tendon dysfunction (PTTD) of both lower extremities 09/09/2020   Hav (hallux abducto valgus), unspecified laterality 09/09/2020   Capsulitis 09/09/2020   Low back pain 01/25/2012   Abdominal pain 11/02/2011   Obesity (BMI 30.0-34.9) 10/31/2011   Post herpetic neuralgia 09/14/2011   DEPRESSION 12/22/2006   HYPERTENSION 12/22/2006   ALLERGIC RHINITIS 12/22/2006    PALPITATIONS, HX OF 12/22/2006    REFERRING DIAG: Z98.1 (ICD-10-CM) - S/P lumbar fusion M43.16 (ICD-10-CM) - Spondylolisthesis at L3-L4 level M54.16 (ICD-10-CM) - Radiculopathy, lumbar region   THERAPY DIAG:  Muscle weakness (generalized)  Other abnormalities of gait and mobility  Other low back pain  Rationale for Evaluation and Treatment Rehabilitation  PERTINENT HISTORY: s/p L3-4 and L4-5 TLIF 08/31/21  PRECAUTIONS: Back Avoid bending, stooping and twisting: Update 12/10/21: Wean from brace 1/2 days in brace for 2 weeks then discontinue May start progressive ROM of the lumbar spine with core strengthening exercises, no extreme stretching or flexibility training as healing is progressive over the year following the lumbar fusion. LE strengthening and hamstring stretching, aerobic exercise.  SUBJECTIVE: " I am doing well today."    PAIN:  Are you having pain? yes NPRS scale: 3/10 Pain location: Right low back Pain description: achy Aggravating factors: getting up Relieving factors: rest    OBJECTIVE: (objective measures completed at initial evaluation unless otherwise dated) PATIENT SURVEYS:  FOTO 41% (predicted 57%)  11/30/21: 54% 12/13/21: 60%   POSTURE: rounded shoulders and forward head   LUMBAR ROM:  Deferred due to post-op acuity and current ROM restrictions  Active  A/PROM  eval  Flexion    Extension    Right lateral flexion  Left lateral flexion    Right rotation    Left rotation     (Blank rows = not tested)   LOWER EXTREMITY MMT:     MMT Right eval Left eval  Hip flexion 3+ 4-  Hip extension      Hip abduction 3- 3-  Hip adduction      Hip internal rotation      Hip external rotation      Knee flexion 5 5  Knee extension 5 5  Ankle dorsiflexion 4 4  Ankle plantarflexion      Ankle inversion      Ankle eversion       (Blank rows = not tested)   FUNCTIONAL TESTS:                        SLS: unable 12/07/21: LLE 3 seconds; RLE 1 second :  12/13/21: 3 sec best bilat  5 X STS: 28.3 seconds with BUE support: 12/13/21 : 25.3 sec without UE TUG: 35 seconds  11/18/21: 26 seconds with SPC  12/07/21: 23.6 seconds with SPC  12/13/21: 23.0 secounds with SPC   Sit <> stand: requires cues for proper hand placement (continues to put hands on RW) Sit to supine: Min A  Supine to Sit: Mod A  11/22/21: Sit <>supine: Mod I (increased time)   GAIT: - assessed 12/02/2021 Distance walked: - Assistive device utilized: SPC Level of assistance: Modified independence Comments: slow gait speed      TODAY'S TREATMENT  [facility] Adult PT Treatment:                                                DATE: 12/15/2021 Therapeutic Exercise: NuStep level L5 x 5 minutes with UE/LE while taking subjective SLS 4 sec best bilateral  Tandem stance 10-25 sec trials  Standing heel raises x 20 Gastroc stretch at counter x 30 sec each x 2 Standing hip abduction red 2 x 10 each Standing hip extension red 2 x 10 each  STS from bariatric chair x 10 without UE PPT 10 x 2 , 5 sec Supine march  2 x 10 with PPT  LTR in comfortable ROM Gentle SKTC 2 x 30 sec  Hamstring stretch with strap   [facility] Adult PT Treatment:                                                DATE: 12/13/2021 Therapeutic Exercise: NuStep level L5 x 5 minutes with UE/LE while taking subjective SLS 3 sec best bilateral  Standing heel raises x 20 Gastroc stretch at counter x 30 sec each x 2 Standing hip abduction yellow x 10 each Standing hip extension yellow x 10 each  5 x STS 25.3 sec without UE STS from bariatric chair x 10 without UE TUG 23 sec LTR in comfortable ROM Gentle SKTC Hamstring stretch with strap   [facility] Adult PT Treatment:                                                DATE: 12/10/2021  Therapeutic Exercise: NuStep level L5 x 5 minutes with UE/LE while taking subjective Standing hip abduction yellow x 10 each Standing hip extension yellow x 10 each  Standing heel raises 2 x 15 Gastroc  stretch at counter x 30 sec each STS from bariatric chair x 10 without UE LTR in comfortable ROM Gentle SKTC Hamstring stretch with strap    [facility] Adult PT Treatment:                                                DATE: 12/07/21 Therapeutic Exercise: NuStep level 5 x 5 minutes Figure 4 stretch 2 x 30 sec each  Supine posterior pelvic tilts 2 x 10  Prone hip extension 2 x 10  Prone hamstring curl 2 x 10 green band  LAQ 2 x 10 @ 2 #     PATIENT EDUCATION:  Education details:discuss precautions at upcoming MD appointment  Person educated: Patient Education method: instruction  Education comprehension: Verbalized understanding   HOME EXERCISE PROGRAM: Access Code: [email or web address] URL: [email or web address] Date: 12/15/2021 Prepared by: Hessie Diener  Exercises - Seated March  - 2 x daily - 7 x weekly - 2 sets - 10 reps - Seated Long Arc Quad  - 2 x daily - 7 x weekly - 2 sets - 10 reps - Heel Raises with Counter Support  - 2 x daily - 7 x weekly - 2 sets - 10 reps - Supine Bridge  - 1 x daily - 7 x weekly - 2 sets - 10 reps - Small Range Straight Leg Raise  - 1 x daily - 7 x weekly - 2 sets - 10 reps - Hooklying Clamshell with Resistance  - 1 x daily - 7 x weekly - 2 sets - 10 reps - Supine Lower Trunk Rotation  - 1 x daily - 7 x weekly - 1 sets - 5 reps - 5-10 hold - Hooklying Single Knee to Chest  - 1 x daily - 7 x weekly - 1 sets - 2-3 reps - 30 hold - Hooklying Hamstring Stretch with Strap  - 1 x daily - 7 x weekly - 1 sets - 2-3 reps - 30 hold - Pelvic tilt  - 1 x daily - 7 x weekly - 2 sets - 10 reps - 5 hold - Supine March with Posterior Pelvic Tilt  - 1 x daily - 7 x weekly - 2 sets - 10 reps    ASSESSMENT: CLINICAL IMPRESSION: Able to progress with initial core strengthening. Mrs Schrum was able to perform posterior pelvic tilt (PPT) with good technique and min cues. She was also able to progress with supine marching while maintaining PPT.  Her HEP was  updated to include new core exercises. She has one more week to wean from her lumbar brace. She tolerated the session well with reports of gentle pulling during stretches, especially runners stretch. She had no increased in pain with core strengthening.  She will benefit from continued skilled physical therapy to address remaining deficits for completion of LTGs.      OBJECTIVE IMPAIRMENTS Abnormal gait, decreased activity tolerance, decreased balance, decreased endurance, decreased knowledge of use of DME, decreased mobility, difficulty walking, decreased ROM, decreased strength, improper body mechanics, postural dysfunction, and pain.    ACTIVITY LIMITATIONS carrying, lifting, bending, sitting, standing, squatting, stairs, transfers, bed mobility, bathing, toileting, and  locomotion level   PARTICIPATION LIMITATIONS: meal prep, cleaning, laundry, driving, shopping, community activity, and yard work   PERSONAL FACTORS Age and Fitness are also affecting patient's functional outcome.      GOALS: Goals reviewed with patient? Yes   SHORT TERM GOALS: Target date: 12/09/2021   Patient will require no cues for proper hand placement when performing sit to stand to improve safety with transfers.  Baseline: constant cues to not push from the RW; 11/22/21 unable to recall proper placement; 12/07/21: able to safely transfer with her [facility] and recall hand positioning   Goal status: MET   2.  Patient will be Mod I with supine <>sit transfer.  Baseline:  Goal status: MET   3.  Patient will maintain SLS for at least 2 seconds bilaterally to improve gait stability.  Baseline: unable 12/13/21: able to hold SLS for 3 sec bilaterally  Goal status: MET   4.  Patient will complete TUG in </=25 seconds to signify a reduction in fall risk  Baseline:  Goal status: MET     LONG TERM GOALS: Target date: 01/08/2022   Patient will demonstrate proper bending and lifting mechanics to reduce stress on her back.   Baseline: unable  Status:12/13/21:  Is weaning from back brace  Goal status: ONGOING   2.  Patient will ambulate community and household distances with LRAD.  Baseline:  Status: 12/13/21: uses SPC at grocery store , mostly no AD use in home  Goal status: MET   3.  Patient will complete 5 x STS in </= 20 seconds to improve her functional strength.   Baseline:  Goal status: ONGOING   4.  Patient will demonstrate 4/5 hip flexor strength to improve ability to negotiate stairs and curbs. Baseline: R 3+/5, L 4-/5 Goal status: ONGOING   5.  Patient will demonstrate at least 4-/5 bilateral hip abductor strength to improve stability about the chain with walking activity.  Baseline: 3-/5 bilateral Goal status: ONGOING   6.  Patient will tolerate at least 30 minutes of standing activity in order to cook for her family.  Baseline: see above  Status: 12/13/21: can tolerate 15-20 minutes  Goal status: ONGOING             7. Patient will score at least 57% on FOTO to signify clinically meaningful improvement in functional abilities.                       Baseline: 41% Status: 11/30/21: 54%        Status: 12/13/21: 60%                      Goal status: MET   PLAN: PT FREQUENCY: 2x/week   PT DURATION: 8 weeks   PLANNED INTERVENTIONS: Therapeutic exercises, Therapeutic activity, Neuromuscular re-education, Balance training, Gait training, Patient/Family education, Self Care, Stair training, DME instructions, Cryotherapy, Moist heat, Manual therapy, and Re-evaluation.   PLAN FOR NEXT SESSION: Begin gentle core strength:  Per MD 12/09/21: Rich Brave from brace 1/2 days in brace for 2 weeks then discontinue May start progressive ROM of the lumbar spine with core strengthening exercises, no extreme stretching or flexibility training as healing is progressive over the year following the lumbar fusion. LE strengthening and hamstring stretching, aerobic exercise.   Hessie Diener, PTA 12/15/21 10:17  AM Phone: [phone] Fax: [phone]

## 2021-12-21 ENCOUNTER — Ambulatory Visit: Payer: Medicare Other | Attending: Specialist | Admitting: Physical Therapy

## 2021-12-21 ENCOUNTER — Encounter: Payer: Self-pay | Admitting: Physical Therapy

## 2021-12-21 DIAGNOSIS — Z981 Arthrodesis status: Secondary | ICD-10-CM | POA: Insufficient documentation

## 2021-12-21 DIAGNOSIS — R2689 Other abnormalities of gait and mobility: Secondary | ICD-10-CM | POA: Diagnosis present

## 2021-12-21 DIAGNOSIS — M5459 Other low back pain: Secondary | ICD-10-CM | POA: Diagnosis present

## 2021-12-21 DIAGNOSIS — M6281 Muscle weakness (generalized): Secondary | ICD-10-CM | POA: Diagnosis present

## 2021-12-21 DIAGNOSIS — M4316 Spondylolisthesis, lumbar region: Secondary | ICD-10-CM | POA: Diagnosis not present

## 2021-12-21 NOTE — Therapy (Signed)
OUTPATIENT PHYSICAL THERAPY TREATMENT NOTE      Patient Name: Carol Wilkins MRN: [id number] DOB:[birth year], 66 y.o., female Today's Date: 12/21/2021  PCP: Roselee Nova REFERRING PROVIDER: Jessy Oto, MD  END OF SESSION:   PT End of Session - 12/21/21 0935     Visit Number 12    Number of Visits 17    Date for PT Re-Evaluation 01/08/22    Authorization Type U[employer] MCR    Progress Note Due on Visit 20    PT Start Time 0932    PT Stop Time 1014    PT Time Calculation (min) 42 min                  Past Medical History:  Diagnosis Date   Anxiety attack    Arthritis    Diabetes mellitus without complication ([place])    type 2 on metformin   High cholesterol    Hypertension    Past Surgical History:  Procedure Laterality Date   ABDOMINAL HYSTERECTOMY     in early 26's   CHOLECYSTECTOMY     in the 90's   TEE WITHOUT CARDIOVERSION N/A 07/14/2015   Procedure: TRANSESOPHAGEAL ECHOCARDIOGRAM (TEE);  Surgeon: Adrian Prows, MD;  Location: [facility];  Service: Cardiovascular;  Laterality: N/A;   [place]   Patient Active Problem List   Diagnosis Date Noted   Spinal stenosis, lumbar region with neurogenic claudication    Spondylolisthesis, lumbar region    Fusion of spine of lumbar region 08/31/2021   Colon cancer screening 09/09/2020   Constipation 09/09/2020   Flatulence, eructation and gas pain 09/09/2020   Irritable bowel syndrome 09/09/2020   Personal history of colonic polyps 09/09/2020   Rectal bleeding 09/09/2020   Pes planus 09/09/2020   Posterior tibial tendon dysfunction (PTTD) of both lower extremities 09/09/2020   Hav (hallux abducto valgus), unspecified laterality 09/09/2020   Capsulitis 09/09/2020   Low back pain 01/25/2012   Abdominal pain 11/02/2011   Obesity (BMI 30.0-34.9) 10/31/2011   Post herpetic neuralgia 09/14/2011   DEPRESSION 12/22/2006   HYPERTENSION 12/22/2006   ALLERGIC RHINITIS 12/22/2006    PALPITATIONS, HX OF 12/22/2006    REFERRING DIAG: Z98.1 (ICD-10-CM) - S/P lumbar fusion M43.16 (ICD-10-CM) - Spondylolisthesis at L3-L4 level M54.16 (ICD-10-CM) - Radiculopathy, lumbar region   THERAPY DIAG:  Muscle weakness (generalized)  Other abnormalities of gait and mobility  Other low back pain  Rationale for Evaluation and Treatment Rehabilitation  PERTINENT HISTORY: s/p L3-4 and L4-5 TLIF 08/31/21  PRECAUTIONS: Back Avoid bending, stooping and twisting: Update 12/10/21: Wean from brace 1/2 days in brace for 2 weeks then discontinue May start progressive ROM of the lumbar spine with core strengthening exercises, no extreme stretching or flexibility training as healing is progressive over the year following the lumbar fusion. LE strengthening and hamstring stretching, aerobic exercise.  SUBJECTIVE: Pt reported that she attended church for the first time since surgery and continues to wean from her brace.    PAIN:  Are you having pain? yes NPRS scale: 0/10 Pain location: Right low back Pain description: achy Aggravating factors: getting up Relieving factors: rest    OBJECTIVE: (objective measures completed at initial evaluation unless otherwise dated) PATIENT SURVEYS:  FOTO 41% (predicted 57%)  11/30/21: 54% 12/13/21: 60%   POSTURE: rounded shoulders and forward head   LUMBAR ROM:  Deferred due to post-op acuity and current ROM restrictions  Active  A/PROM  eval  Flexion    Extension    Right lateral flexion    Left lateral flexion    Right rotation    Left rotation     (Blank rows = not tested)   LOWER EXTREMITY MMT:     MMT Right eval Left eval  Hip flexion 3+ 4-  Hip extension      Hip abduction 3- 3-  Hip adduction      Hip internal rotation      Hip external rotation      Knee flexion 5 5  Knee extension 5 5  Ankle dorsiflexion 4 4  Ankle plantarflexion      Ankle inversion      Ankle eversion       (Blank rows = not tested)   FUNCTIONAL  TESTS:                        SLS: unable 12/07/21: LLE 3 seconds; RLE 1 second : 12/13/21: 3 sec best bilat  5 X STS: 28.3 seconds with BUE support: 12/13/21 : 25.3 sec without UE TUG: 35 seconds  11/18/21: 26 seconds with SPC  12/07/21: 23.6 seconds with SPC  12/13/21: 23.0 secounds with SPC   Sit <> stand: requires cues for proper hand placement (continues to put hands on RW) Sit to supine: Min A  Supine to Sit: Mod A  11/22/21: Sit <>supine: Mod I (increased time)   GAIT: - assessed 12/02/2021 Distance walked: - Assistive device utilized: SPC Level of assistance: Modified independence Comments: slow gait speed      TODAY'S TREATMENT  [facility] Adult PT Treatment:                                                DATE: 12/21/2021 Therapeutic Exercise: NuStep level L5 x 5 minutes with UE/LE while taking subjective SLS 2 sec -6 sec best bilateral  Tandem stance 25 sec trials  Standing heel raises x 20 Standing hip abduction red 2 x 10 each Standing hip extension red 2 x 10 each  Standing row red x15 Standing ext yellow x 15  STS from mat table 2 x 10 without UE PPT 10 x 1 , 5 sec Supine march  1 x 10 with PPT  PPT with AROM clam x 15 LTR in comfortable ROM Side hip abduction x 10 each Side clam x 10 each  [facility] Adult PT Treatment:                                                DATE: 12/15/2021 Therapeutic Exercise: NuStep level L5 x 5 minutes with UE/LE while taking subjective SLS 4 sec best bilateral  Tandem stance 10-25 sec trials  Standing heel raises x 20 Gastroc stretch at counter x 30 sec each x 2 Standing hip abduction red 2 x 10 each Standing hip extension red 2 x 10 each  STS from bariatric chair x 10 without UE PPT 10 x 2 , 5 sec Supine march  2 x 10 with PPT  LTR in comfortable ROM Gentle SKTC 2 x 30 sec  Hamstring stretch with strap   [facility] Adult PT Treatment:  DATE: 12/13/2021 Therapeutic Exercise: NuStep level L5 x 5  minutes with UE/LE while taking subjective SLS 3 sec best bilateral  Standing heel raises x 20 Gastroc stretch at counter x 30 sec each x 2 Standing hip abduction yellow x 10 each Standing hip extension yellow x 10 each  5 x STS 25.3 sec without UE STS from bariatric chair x 10 without UE TUG 23 sec LTR in comfortable ROM Gentle SKTC Hamstring stretch with strap   [facility] Adult PT Treatment:                                                DATE: 12/10/2021 Therapeutic Exercise: NuStep level L5 x 5 minutes with UE/LE while taking subjective Standing hip abduction yellow x 10 each Standing hip extension yellow x 10 each  Standing heel raises 2 x 15 Gastroc stretch at counter x 30 sec each STS from bariatric chair x 10 without UE LTR in comfortable ROM Gentle SKTC Hamstring stretch with strap      PATIENT EDUCATION:  Education details:discuss precautions at upcoming MD appointment  Person educated: Patient Education method: instruction  Education comprehension: Verbalized understanding   HOME EXERCISE PROGRAM: Access Code: [email or web address] URL: [email or web address] Date: 12/15/2021 Prepared by: Hessie Diener  Exercises - Seated March  - 2 x daily - 7 x weekly - 2 sets - 10 reps - Seated Long Arc Quad  - 2 x daily - 7 x weekly - 2 sets - 10 reps - Heel Raises with Counter Support  - 2 x daily - 7 x weekly - 2 sets - 10 reps - Supine Bridge  - 1 x daily - 7 x weekly - 2 sets - 10 reps - Small Range Straight Leg Raise  - 1 x daily - 7 x weekly - 2 sets - 10 reps - Hooklying Clamshell with Resistance  - 1 x daily - 7 x weekly - 2 sets - 10 reps - Supine Lower Trunk Rotation  - 1 x daily - 7 x weekly - 1 sets - 5 reps - 5-10 hold - Hooklying Single Knee to Chest  - 1 x daily - 7 x weekly - 1 sets - 2-3 reps - 30 hold - Hooklying Hamstring Stretch with Strap  - 1 x daily - 7 x weekly - 1 sets - 2-3 reps - 30 hold - Pelvic tilt  - 1 x daily - 7 x weekly - 2 sets - 10 reps -  5 hold - Supine March with Posterior Pelvic Tilt  - 1 x daily - 7 x weekly - 2 sets - 10 reps    ASSESSMENT: CLINICAL IMPRESSION: Reviewed initial core strengthening and Mrs Wimbish was able to perform with min cues.  She has 2 more days of weaning from brace. Able to progress lateral hip strengthening to side lying without increased pain.   She will benefit from continued skilled physical therapy to address remaining deficits for completion of LTGs.      OBJECTIVE IMPAIRMENTS Abnormal gait, decreased activity tolerance, decreased balance, decreased endurance, decreased knowledge of use of DME, decreased mobility, difficulty walking, decreased ROM, decreased strength, improper body mechanics, postural dysfunction, and pain.    ACTIVITY LIMITATIONS carrying, lifting, bending, sitting, standing, squatting, stairs, transfers, bed mobility, bathing, toileting, and locomotion level   PARTICIPATION LIMITATIONS: meal prep, cleaning, laundry, driving, shopping,  community activity, and yard work   PERSONAL FACTORS Age and Fitness are also affecting patient's functional outcome.      GOALS: Goals reviewed with patient? Yes   SHORT TERM GOALS: Target date: 12/09/2021   Patient will require no cues for proper hand placement when performing sit to stand to improve safety with transfers.  Baseline: constant cues to not push from the RW; 11/22/21 unable to recall proper placement; 12/07/21: able to safely transfer with her [facility] and recall hand positioning   Goal status: MET   2.  Patient will be Mod I with supine <>sit transfer.  Baseline:  Goal status: MET   3.  Patient will maintain SLS for at least 2 seconds bilaterally to improve gait stability.  Baseline: unable 12/13/21: able to hold SLS for 3 sec bilaterally  Goal status: MET   4.  Patient will complete TUG in </=25 seconds to signify a reduction in fall risk  Baseline:  Goal status: MET     LONG TERM GOALS: Target date: 01/08/2022    Patient will demonstrate proper bending and lifting mechanics to reduce stress on her back.  Baseline: unable  Status:12/13/21:  Is weaning from back brace  Goal status: ONGOING   2.  Patient will ambulate community and household distances with LRAD.  Baseline:  Status: 12/13/21: uses SPC at grocery store , mostly no AD use in home  Goal status: MET   3.  Patient will complete 5 x STS in </= 20 seconds to improve her functional strength.   Baseline:  Goal status: ONGOING   4.  Patient will demonstrate 4/5 hip flexor strength to improve ability to negotiate stairs and curbs. Baseline: R 3+/5, L 4-/5 Goal status: ONGOING   5.  Patient will demonstrate at least 4-/5 bilateral hip abductor strength to improve stability about the chain with walking activity.  Baseline: 3-/5 bilateral Goal status: ONGOING   6.  Patient will tolerate at least 30 minutes of standing activity in order to cook for her family.  Baseline: see above  Status: 12/13/21: can tolerate 15-20 minutes  Goal status: ONGOING             7. Patient will score at least 57% on FOTO to signify clinically meaningful improvement in functional abilities.                       Baseline: 41% Status: 11/30/21: 54%        Status: 12/13/21: 60%                      Goal status: MET   PLAN: PT FREQUENCY: 2x/week   PT DURATION: 8 weeks   PLANNED INTERVENTIONS: Therapeutic exercises, Therapeutic activity, Neuromuscular re-education, Balance training, Gait training, Patient/Family education, Self Care, Stair training, DME instructions, Cryotherapy, Moist heat, Manual therapy, and Re-evaluation.   PLAN FOR NEXT SESSION: Begin gentle core strength:  Per MD 12/09/21: Rich Brave from brace 1/2 days in brace for 2 weeks then discontinue May start progressive ROM of the lumbar spine with core strengthening exercises, no extreme stretching or flexibility training as healing is progressive over the year following the lumbar fusion. LE strengthening  and hamstring stretching, aerobic exercise.   Hessie Diener, PTA 12/21/21 10:15 AM Phone: [phone] Fax: [phone]

## 2021-12-23 ENCOUNTER — Ambulatory Visit: Payer: Medicare Other

## 2021-12-23 DIAGNOSIS — R2689 Other abnormalities of gait and mobility: Secondary | ICD-10-CM

## 2021-12-23 DIAGNOSIS — M6281 Muscle weakness (generalized): Secondary | ICD-10-CM | POA: Diagnosis not present

## 2021-12-23 DIAGNOSIS — M5459 Other low back pain: Secondary | ICD-10-CM

## 2021-12-23 NOTE — Therapy (Signed)
OUTPATIENT PHYSICAL THERAPY TREATMENT NOTE      Patient Name: Carol Wilkins MRN: [id number] DOB:1955-08-29, 66 y.o., female Today's Date: 12/23/2021  PCP: Roselee Nova REFERRING PROVIDER: Jessy Oto, MD  END OF SESSION:   PT End of Session - 12/23/21 1101     Visit Number 13    Number of Visits 17    Date for PT Re-Evaluation 01/08/22    Authorization Type UHC MCR    Progress Note Due on Visit 20    PT Start Time 1100    PT Stop Time 1140    PT Time Calculation (min) 40 min    Activity Tolerance Patient tolerated treatment well    Behavior During Therapy WFL for tasks assessed/performed                  Past Medical History:  Diagnosis Date   Anxiety attack    Arthritis    Diabetes mellitus without complication ([place])    type 2 on metformin   High cholesterol    Hypertension    Past Surgical History:  Procedure Laterality Date   ABDOMINAL HYSTERECTOMY     in early 74's   CHOLECYSTECTOMY     in the 90's   TEE WITHOUT CARDIOVERSION N/A 07/14/2015   Procedure: TRANSESOPHAGEAL ECHOCARDIOGRAM (TEE);  Surgeon: Adrian Prows, MD;  Location: [place];  Service: Cardiovascular;  Laterality: N/A;   [place]   Patient Active Problem List   Diagnosis Date Noted   Spinal stenosis, lumbar region with neurogenic claudication    Spondylolisthesis, lumbar region    Fusion of spine of lumbar region 08/31/2021   Colon cancer screening 09/09/2020   Constipation 09/09/2020   Flatulence, eructation and gas pain 09/09/2020   Irritable bowel syndrome 09/09/2020   Personal history of colonic polyps 09/09/2020   Rectal bleeding 09/09/2020   Pes planus 09/09/2020   Posterior tibial tendon dysfunction (PTTD) of both lower extremities 09/09/2020   Hav (hallux abducto valgus), unspecified laterality 09/09/2020   Capsulitis 09/09/2020   Low back pain 01/25/2012   Abdominal pain 11/02/2011   Obesity (BMI 30.0-34.9) 10/31/2011    Post herpetic neuralgia 09/14/2011   DEPRESSION 12/22/2006   HYPERTENSION 12/22/2006   ALLERGIC RHINITIS 12/22/2006   PALPITATIONS, HX OF 12/22/2006    REFERRING DIAG: Z98.1 (ICD-10-CM) - S/P lumbar fusion M43.16 (ICD-10-CM) - Spondylolisthesis at L3-L4 level M54.16 (ICD-10-CM) - Radiculopathy, lumbar region   THERAPY DIAG:  Muscle weakness (generalized)  Other abnormalities of gait and mobility  Other low back pain  Rationale for Evaluation and Treatment Rehabilitation  PERTINENT HISTORY: s/p L3-4 and L4-5 TLIF 08/31/21  PRECAUTIONS: Back Avoid bending, stooping and twisting: Update 12/10/21: Wean from brace 1/2 days in brace for 2 weeks then discontinue May start progressive ROM of the lumbar spine with core strengthening exercises, no extreme stretching or flexibility training as healing is progressive over the year following the lumbar fusion. LE strengthening and hamstring stretching, aerobic exercise.  SUBJECTIVE: Patient reports today is her first day without the brace and she is doing ok.    PAIN:  Are you having pain? None At worst NPRS scale: 4/10 Pain location: Right low back Pain description: achy, sore Aggravating factors: sleep in wrong position  Relieving factors: rest    OBJECTIVE: (objective measures completed at initial evaluation unless otherwise dated) PATIENT SURVEYS:  FOTO 41% (predicted 57%)  11/30/21: 54% 12/13/21: 60%   POSTURE: rounded shoulders and  forward head   LUMBAR ROM:  Deferred due to post-op acuity and current ROM restrictions  Active  A/PROM  eval  Flexion    Extension    Right lateral flexion    Left lateral flexion    Right rotation    Left rotation     (Blank rows = not tested)   LOWER EXTREMITY MMT:     MMT Right eval Left eval  Hip flexion 3+ 4-  Hip extension      Hip abduction 3- 3-  Hip adduction      Hip internal rotation      Hip external rotation      Knee flexion 5 5  Knee extension 5 5  Ankle dorsiflexion  4 4  Ankle plantarflexion      Ankle inversion      Ankle eversion       (Blank rows = not tested)   FUNCTIONAL TESTS:                        SLS: unable 12/07/21: LLE 3 seconds; RLE 1 second : 12/13/21: 3 sec best bilat  5 X STS: 28.3 seconds with BUE support: 12/13/21 : 25.3 sec without UE TUG: 35 seconds  11/18/21: 26 seconds with SPC  12/07/21: 23.6 seconds with SPC  12/13/21: 23.0 secounds with SPC   Sit <> stand: requires cues for proper hand placement (continues to put hands on RW) Sit to supine: Min A  Supine to Sit: Mod A  11/22/21: Sit <>supine: Mod I (increased time)   GAIT: - assessed 12/02/2021 Distance walked: - Assistive device utilized: SPC Level of assistance: Modified independence Comments: slow gait speed      TODAY'S TREATMENT  [facility] Adult PT Treatment:                                                DATE: 12/23/21 Therapeutic Exercise: Recumbent bike level 2 x 5 minutes  LTR through comfortable range x 60 seconds  Partial range pressup on elbows x 10  Seated pelvic tilts 2 x 10  Stability ball rollout through comfortable range x 1 minute  Seated hip hinge with dowel x 10   Self Care: Educated on sleep positions, posture, body mechanics with lifting and bending. Handout provided    [facility] Adult PT Treatment:                                                DATE: 12/21/2021 Therapeutic Exercise: NuStep level L5 x 5 minutes with UE/LE while taking subjective SLS 2 sec -6 sec best bilateral  Tandem stance 25 sec trials  Standing heel raises x 20 Standing hip abduction red 2 x 10 each Standing hip extension red 2 x 10 each  Standing row red x15 Standing ext yellow x 15  STS from mat table 2 x 10 without UE PPT 10 x 1 , 5 sec Supine march  1 x 10 with PPT  PPT with AROM clam x 15 LTR in comfortable ROM Side hip abduction x 10 each Side clam x 10 each  [facility] Adult PT Treatment:  DATE: 12/15/2021 Therapeutic  Exercise: NuStep level L5 x 5 minutes with UE/LE while taking subjective SLS 4 sec best bilateral  Tandem stance 10-25 sec trials  Standing heel raises x 20 Gastroc stretch at counter x 30 sec each x 2 Standing hip abduction red 2 x 10 each Standing hip extension red 2 x 10 each  STS from bariatric chair x 10 without UE PPT 10 x 2 , 5 sec Supine march  2 x 10 with PPT  LTR in comfortable ROM Gentle SKTC 2 x 30 sec  Hamstring stretch with strap     PATIENT EDUCATION:  Education details:see treatment  Person educated: Patient Education method: instruction, demo, handout  Education comprehension: Verbalized understanding   HOME EXERCISE PROGRAM: Access Code: [email or web address] URL: [email or web address] Date: 12/15/2021 Prepared by: Hessie Diener  Exercises - Seated March  - 2 x daily - 7 x weekly - 2 sets - 10 reps - Seated Long Arc Quad  - 2 x daily - 7 x weekly - 2 sets - 10 reps - Heel Raises with Counter Support  - 2 x daily - 7 x weekly - 2 sets - 10 reps - Supine Bridge  - 1 x daily - 7 x weekly - 2 sets - 10 reps - Small Range Straight Leg Raise  - 1 x daily - 7 x weekly - 2 sets - 10 reps - Hooklying Clamshell with Resistance  - 1 x daily - 7 x weekly - 2 sets - 10 reps - Supine Lower Trunk Rotation  - 1 x daily - 7 x weekly - 1 sets - 5 reps - 5-10 hold - Hooklying Single Knee to Chest  - 1 x daily - 7 x weekly - 1 sets - 2-3 reps - 30 hold - Hooklying Hamstring Stretch with Strap  - 1 x daily - 7 x weekly - 1 sets - 2-3 reps - 30 hold - Pelvic tilt  - 1 x daily - 7 x weekly - 2 sets - 10 reps - 5 hold - Supine March with Posterior Pelvic Tilt  - 1 x daily - 7 x weekly - 2 sets - 10 reps    ASSESSMENT: CLINICAL IMPRESSION:  Patient tolerated session well today focusing on gentle progression of lumbar AROM as well as introduction to proper body mechanics with bending/lifting activity. No reports of back pain throughout session, but reports occasional hip pain  that she attributes to arthritis. Able to perform seated hip hinge properly, so will plan to progress into standing hip hinge at next visit. Time spent educating patient on proper bending and lifting mechanics with handout provided.    OBJECTIVE IMPAIRMENTS Abnormal gait, decreased activity tolerance, decreased balance, decreased endurance, decreased knowledge of use of DME, decreased mobility, difficulty walking, decreased ROM, decreased strength, improper body mechanics, postural dysfunction, and pain.    ACTIVITY LIMITATIONS carrying, lifting, bending, sitting, standing, squatting, stairs, transfers, bed mobility, bathing, toileting, and locomotion level   PARTICIPATION LIMITATIONS: meal prep, cleaning, laundry, driving, shopping, community activity, and yard work   PERSONAL FACTORS Age and Fitness are also affecting patient's functional outcome.      GOALS: Goals reviewed with patient? Yes   SHORT TERM GOALS: Target date: 12/09/2021   Patient will require no cues for proper hand placement when performing sit to stand to improve safety with transfers.  Baseline: constant cues to not push from the RW; 11/22/21 unable to recall proper placement; 12/07/21: able to safely transfer with her [facility]  and recall hand positioning   Goal status: MET   2.  Patient will be Mod I with supine <>sit transfer.  Baseline:  Goal status: MET   3.  Patient will maintain SLS for at least 2 seconds bilaterally to improve gait stability.  Baseline: unable 12/13/21: able to hold SLS for 3 sec bilaterally  Goal status: MET   4.  Patient will complete TUG in </=25 seconds to signify a reduction in fall risk  Baseline:  Goal status: MET     LONG TERM GOALS: Target date: 01/08/2022   Patient will demonstrate proper bending and lifting mechanics to reduce stress on her back.  Baseline: unable  Status:12/13/21:  Is weaning from back brace  Goal status: ONGOING   2.  Patient will ambulate community and household  distances with LRAD.  Baseline:  Status: 12/13/21: uses SPC at grocery store , mostly no AD use in home  Goal status: MET   3.  Patient will complete 5 x STS in </= 20 seconds to improve her functional strength.   Baseline:  Goal status: ONGOING   4.  Patient will demonstrate 4/5 hip flexor strength to improve ability to negotiate stairs and curbs. Baseline: R 3+/5, L 4-/5 Goal status: ONGOING   5.  Patient will demonstrate at least 4-/5 bilateral hip abductor strength to improve stability about the chain with walking activity.  Baseline: 3-/5 bilateral Goal status: ONGOING   6.  Patient will tolerate at least 30 minutes of standing activity in order to cook for her family.  Baseline: see above  Status: 12/13/21: can tolerate 15-20 minutes  Goal status: ONGOING             7. Patient will score at least 57% on FOTO to signify clinically meaningful improvement in functional abilities.                       Baseline: 41% Status: 11/30/21: 54%        Status: 12/13/21: 60%                      Goal status: MET   PLAN: PT FREQUENCY: 2x/week   PT DURATION: 8 weeks   PLANNED INTERVENTIONS: Therapeutic exercises, Therapeutic activity, Neuromuscular re-education, Balance training, Gait training, Patient/Family education, Self Care, Stair training, DME instructions, Cryotherapy, Moist heat, Manual therapy, and Re-evaluation.   PLAN FOR NEXT SESSION: Begin gentle core strength:  Per MD 12/09/21: Rich Brave from brace 1/2 days in brace for 2 weeks then discontinue May start progressive ROM of the lumbar spine with core strengthening exercises, no extreme stretching or flexibility training as healing is progressive over the year following the lumbar fusion. LE strengthening and hamstring stretching, aerobic exercise.  Gwendolyn Grant, PT, DPT, ATC 12/23/21 11:44 AM

## 2021-12-23 NOTE — Patient Instructions (Signed)

## 2021-12-28 ENCOUNTER — Ambulatory Visit: Payer: Medicare Other

## 2021-12-28 DIAGNOSIS — R2689 Other abnormalities of gait and mobility: Secondary | ICD-10-CM

## 2021-12-28 DIAGNOSIS — M5459 Other low back pain: Secondary | ICD-10-CM

## 2021-12-28 DIAGNOSIS — M6281 Muscle weakness (generalized): Secondary | ICD-10-CM | POA: Diagnosis not present

## 2021-12-28 NOTE — Therapy (Signed)
OUTPATIENT PHYSICAL THERAPY TREATMENT NOTE      Patient Name: Carol Wilkins MRN: [id number] DOB:August 11, 1955, 66 y.o., female Today's Date: 12/28/2021  PCP: Roselee Nova REFERRING PROVIDER: Jessy Oto, MD  END OF SESSION:   PT End of Session - 12/28/21 0931     Visit Number 14    Number of Visits 17    Date for PT Re-Evaluation 01/08/22    Authorization Type UHC MCR    Progress Note Due on Visit 20    PT Start Time 0931    PT Stop Time 1012    PT Time Calculation (min) 41 min    Activity Tolerance Patient tolerated treatment well    Behavior During Therapy WFL for tasks assessed/performed                  Past Medical History:  Diagnosis Date   Anxiety attack    Arthritis    Diabetes mellitus without complication ([place])    type 2 on metformin   High cholesterol    Hypertension    Past Surgical History:  Procedure Laterality Date   ABDOMINAL HYSTERECTOMY     in early 57's   CHOLECYSTECTOMY     in the 90's   TEE WITHOUT CARDIOVERSION N/A 07/14/2015   Procedure: TRANSESOPHAGEAL ECHOCARDIOGRAM (TEE);  Surgeon: Adrian Prows, MD;  Location: [place];  Service: Cardiovascular;  Laterality: N/A;   [place]   Patient Active Problem List   Diagnosis Date Noted   Spinal stenosis, lumbar region with neurogenic claudication    Spondylolisthesis, lumbar region    Fusion of spine of lumbar region 08/31/2021   Colon cancer screening 09/09/2020   Constipation 09/09/2020   Flatulence, eructation and gas pain 09/09/2020   Irritable bowel syndrome 09/09/2020   Personal history of colonic polyps 09/09/2020   Rectal bleeding 09/09/2020   Pes planus 09/09/2020   Posterior tibial tendon dysfunction (PTTD) of both lower extremities 09/09/2020   Hav (hallux abducto valgus), unspecified laterality 09/09/2020   Capsulitis 09/09/2020   Low back pain 01/25/2012   Abdominal pain 11/02/2011   Obesity (BMI 30.0-34.9) 10/31/2011    Post herpetic neuralgia 09/14/2011   DEPRESSION 12/22/2006   HYPERTENSION 12/22/2006   ALLERGIC RHINITIS 12/22/2006   PALPITATIONS, HX OF 12/22/2006    REFERRING DIAG: Z98.1 (ICD-10-CM) - S/P lumbar fusion M43.16 (ICD-10-CM) - Spondylolisthesis at L3-L4 level M54.16 (ICD-10-CM) - Radiculopathy, lumbar region   THERAPY DIAG:  Muscle weakness (generalized)  Other abnormalities of gait and mobility  Other low back pain  Rationale for Evaluation and Treatment Rehabilitation  PERTINENT HISTORY: s/p L3-4 and L4-5 TLIF 08/31/21  PRECAUTIONS: Back Avoid bending, stooping and twisting: Update 12/10/21: Wean from brace 1/2 days in brace for 2 weeks then discontinue May start progressive ROM of the lumbar spine with core strengthening exercises, no extreme stretching or flexibility training as healing is progressive over the year following the lumbar fusion. LE strengthening and hamstring stretching, aerobic exercise.  SUBJECTIVE: Patient reports she is feeling tired and tight in her back right now, but no pain. She is no longer wearing her brace and reports this is going well.    PAIN:  Are you having pain? None At worst NPRS scale: 4/10 Pain location: Right low back Pain description: achy, sore Aggravating factors: sleep in wrong position  Relieving factors: rest    OBJECTIVE: (objective measures completed at initial evaluation unless otherwise dated) PATIENT SURVEYS:  FOTO  41% (predicted 57%)  11/30/21: 54% 12/13/21: 60%   POSTURE: rounded shoulders and forward head   LUMBAR ROM:  Deferred due to post-op acuity and current ROM restrictions  Active  A/PROM  eval  Flexion    Extension    Right lateral flexion    Left lateral flexion    Right rotation    Left rotation     (Blank rows = not tested)   LOWER EXTREMITY MMT:     MMT Right eval Left eval  Hip flexion 3+ 4-  Hip extension      Hip abduction 3- 3-  Hip adduction      Hip internal rotation      Hip external  rotation      Knee flexion 5 5  Knee extension 5 5  Ankle dorsiflexion 4 4  Ankle plantarflexion      Ankle inversion      Ankle eversion       (Blank rows = not tested)   FUNCTIONAL TESTS:                        SLS: unable 12/07/21: LLE 3 seconds; RLE 1 second : 12/13/21: 3 sec best bilat  5 X STS: 28.3 seconds with BUE support: 12/13/21 : 25.3 sec without UE TUG: 35 seconds  11/18/21: 26 seconds with SPC  12/07/21: 23.6 seconds with SPC  12/13/21: 23.0 secounds with SPC   Sit <> stand: requires cues for proper hand placement (continues to put hands on RW) Sit to supine: Min A  Supine to Sit: Mod A  11/22/21: Sit <>supine: Mod I (increased time)   GAIT: - assessed 12/02/2021 Distance walked: - Assistive device utilized: SPC Level of assistance: Modified independence Comments: slow gait speed      TODAY'S TREATMENT  [facility] Adult PT Treatment:                                                DATE: 12/28/21 Therapeutic Exercise: NuStep level 6 x 5 minutes UE/LE  LTR through comfortable range x 1 minute  Double knees to chest x 10  Standing lumbar extension through comfortable range x 10  Wall squats 2 x 10  Hip hinge seated with dowel 1 x 10  Standing hip hinge with dowel 1 x 5  Hip hinge with 5 lb kettlebell to 14 inch step 2 x 5    [facility] Adult PT Treatment:                                                DATE: 12/23/21 Therapeutic Exercise: Recumbent bike level 2 x 5 minutes  LTR through comfortable range x 60 seconds  Partial range pressup on elbows x 10  Seated pelvic tilts 2 x 10  Stability ball rollout through comfortable range x 1 minute  Seated hip hinge with dowel x 10   Self Care: Educated on sleep positions, posture, body mechanics with lifting and bending. Handout provided    [facility] Adult PT Treatment:  DATE: 12/21/2021 Therapeutic Exercise: NuStep level L5 x 5 minutes with UE/LE while taking subjective SLS 2 sec -6 sec  best bilateral  Tandem stance 25 sec trials  Standing heel raises x 20 Standing hip abduction red 2 x 10 each Standing hip extension red 2 x 10 each  Standing row red x15 Standing ext yellow x 15  STS from mat table 2 x 10 without UE PPT 10 x 1 , 5 sec Supine march  1 x 10 with PPT  PPT with AROM clam x 15 LTR in comfortable ROM Side hip abduction x 10 each Side clam x 10 each     PATIENT EDUCATION:  Education details:N/A Person educated: N/A Education method: N/A Education comprehension: N/A   HOME EXERCISE PROGRAM: Access Code: [email or web address] URL: [email or web address] Date: 12/15/2021 Prepared by: Hessie Diener  Exercises - Seated March  - 2 x daily - 7 x weekly - 2 sets - 10 reps - Seated Long Arc Quad  - 2 x daily - 7 x weekly - 2 sets - 10 reps - Heel Raises with Counter Support  - 2 x daily - 7 x weekly - 2 sets - 10 reps - Supine Bridge  - 1 x daily - 7 x weekly - 2 sets - 10 reps - Small Range Straight Leg Raise  - 1 x daily - 7 x weekly - 2 sets - 10 reps - Hooklying Clamshell with Resistance  - 1 x daily - 7 x weekly - 2 sets - 10 reps - Supine Lower Trunk Rotation  - 1 x daily - 7 x weekly - 1 sets - 5 reps - 5-10 hold - Hooklying Single Knee to Chest  - 1 x daily - 7 x weekly - 1 sets - 2-3 reps - 30 hold - Hooklying Hamstring Stretch with Strap  - 1 x daily - 7 x weekly - 1 sets - 2-3 reps - 30 hold - Pelvic tilt  - 1 x daily - 7 x weekly - 2 sets - 10 reps - 5 hold - Supine March with Posterior Pelvic Tilt  - 1 x daily - 7 x weekly - 2 sets - 10 reps    ASSESSMENT: CLINICAL IMPRESSION:  Patient tolerated session well today with further progression of lumbar mobility and functional strengthening. Able to introduce squatting activity without reports of back pain, though requires consistent cues for proper form. She did well with progression of hip hinge demonstrating proper form with light-weight lifting of 5 lbs. She reported occasional Rt hip pain  during session, though otherwise no reports of pain. She reported a reduction in back tightness at conclusion of session.    OBJECTIVE IMPAIRMENTS Abnormal gait, decreased activity tolerance, decreased balance, decreased endurance, decreased knowledge of use of DME, decreased mobility, difficulty walking, decreased ROM, decreased strength, improper body mechanics, postural dysfunction, and pain.    ACTIVITY LIMITATIONS carrying, lifting, bending, sitting, standing, squatting, stairs, transfers, bed mobility, bathing, toileting, and locomotion level   PARTICIPATION LIMITATIONS: meal prep, cleaning, laundry, driving, shopping, community activity, and yard work   PERSONAL FACTORS Age and Fitness are also affecting patient's functional outcome.      GOALS: Goals reviewed with patient? Yes   SHORT TERM GOALS: Target date: 12/09/2021   Patient will require no cues for proper hand placement when performing sit to stand to improve safety with transfers.  Baseline: constant cues to not push from the RW; 11/22/21 unable to recall proper placement; 12/07/21: able to safely  transfer with her [facility] and recall hand positioning   Goal status: MET   2.  Patient will be Mod I with supine <>sit transfer.  Baseline:  Goal status: MET   3.  Patient will maintain SLS for at least 2 seconds bilaterally to improve gait stability.  Baseline: unable 12/13/21: able to hold SLS for 3 sec bilaterally  Goal status: MET   4.  Patient will complete TUG in </=25 seconds to signify a reduction in fall risk  Baseline:  Goal status: MET     LONG TERM GOALS: Target date: 01/08/2022   Patient will demonstrate proper bending and lifting mechanics to reduce stress on her back.  Baseline: unable  Status:12/13/21:  Is weaning from back brace  Goal status: ONGOING   2.  Patient will ambulate community and household distances with LRAD.  Baseline:  Status: 12/13/21: uses SPC at grocery store , mostly no AD use in home  Goal  status: MET   3.  Patient will complete 5 x STS in </= 20 seconds to improve her functional strength.   Baseline:  Goal status: ONGOING   4.  Patient will demonstrate 4/5 hip flexor strength to improve ability to negotiate stairs and curbs. Baseline: R 3+/5, L 4-/5 Goal status: ONGOING   5.  Patient will demonstrate at least 4-/5 bilateral hip abductor strength to improve stability about the chain with walking activity.  Baseline: 3-/5 bilateral Goal status: ONGOING   6.  Patient will tolerate at least 30 minutes of standing activity in order to cook for her family.  Baseline: see above  Status: 12/13/21: can tolerate 15-20 minutes  Goal status: ONGOING             7. Patient will score at least 57% on FOTO to signify clinically meaningful improvement in functional abilities.                       Baseline: 41% Status: 11/30/21: 54%        Status: 12/13/21: 60%                      Goal status: MET   PLAN: PT FREQUENCY: 2x/week   PT DURATION: 8 weeks   PLANNED INTERVENTIONS: Therapeutic exercises, Therapeutic activity, Neuromuscular re-education, Balance training, Gait training, Patient/Family education, Self Care, Stair training, DME instructions, Cryotherapy, Moist heat, Manual therapy, and Re-evaluation.   PLAN FOR NEXT SESSION: Begin gentle core strength:  Per MD 12/09/21: Rich Brave from brace 1/2 days in brace for 2 weeks then discontinue May start progressive ROM of the lumbar spine with core strengthening exercises, no extreme stretching or flexibility training as healing is progressive over the year following the lumbar fusion. LE strengthening and hamstring stretching, aerobic exercise.  Gwendolyn Grant, PT, DPT, ATC 12/28/21 10:15 AM

## 2021-12-30 ENCOUNTER — Ambulatory Visit: Payer: Medicare Other

## 2021-12-30 ENCOUNTER — Other Ambulatory Visit: Payer: Self-pay | Admitting: Family Medicine

## 2021-12-30 DIAGNOSIS — R2689 Other abnormalities of gait and mobility: Secondary | ICD-10-CM

## 2021-12-30 DIAGNOSIS — M5459 Other low back pain: Secondary | ICD-10-CM

## 2021-12-30 DIAGNOSIS — Z1231 Encounter for screening mammogram for malignant neoplasm of breast: Secondary | ICD-10-CM

## 2021-12-30 DIAGNOSIS — M6281 Muscle weakness (generalized): Secondary | ICD-10-CM

## 2021-12-30 NOTE — Therapy (Signed)
OUTPATIENT PHYSICAL THERAPY TREATMENT NOTE      Patient Name: Carol Wilkins MRN: [id number] DOB:Jul 12, 1955, 66 y.o., female Today's Date: 12/30/2021  PCP: Roselee Nova REFERRING PROVIDER: Jessy Oto, MD  END OF SESSION:   PT End of Session - 12/30/21 0930     Visit Number 15    Number of Visits 17    Date for PT Re-Evaluation 01/08/22    Authorization Type [employer] MCR    Progress Note Due on Visit 20    PT Start Time 0930    PT Stop Time 1012    PT Time Calculation (min) 42 min    Activity Tolerance Patient tolerated treatment well    Behavior During Therapy WFL for tasks assessed/performed                  Past Medical History:  Diagnosis Date   Anxiety attack    Arthritis    Diabetes mellitus without complication ([place])    type 2 on metformin   High cholesterol    Hypertension    Past Surgical History:  Procedure Laterality Date   ABDOMINAL HYSTERECTOMY     in early 7's   CHOLECYSTECTOMY     in the 90's   TEE WITHOUT CARDIOVERSION N/A 07/14/2015   Procedure: TRANSESOPHAGEAL ECHOCARDIOGRAM (TEE);  Surgeon: Adrian Prows, MD;  Location: [facility] ENDOSCOPY;  Service: Cardiovascular;  Laterality: N/A;   [place]   Patient Active Problem List   Diagnosis Date Noted   Spinal stenosis, lumbar region with neurogenic claudication    Spondylolisthesis, lumbar region    Fusion of spine of lumbar region 08/31/2021   Colon cancer screening 09/09/2020   Constipation 09/09/2020   Flatulence, eructation and gas pain 09/09/2020   Irritable bowel syndrome 09/09/2020   Personal history of colonic polyps 09/09/2020   Rectal bleeding 09/09/2020   Pes planus 09/09/2020   Posterior tibial tendon dysfunction (PTTD) of both lower extremities 09/09/2020   Hav (hallux abducto valgus), unspecified laterality 09/09/2020   Capsulitis 09/09/2020   Low back pain 01/25/2012   Abdominal pain 11/02/2011   Obesity (BMI 30.0-34.9) 10/31/2011    Post herpetic neuralgia 09/14/2011   DEPRESSION 12/22/2006   HYPERTENSION 12/22/2006   ALLERGIC RHINITIS 12/22/2006   PALPITATIONS, HX OF 12/22/2006    REFERRING DIAG: Z98.1 (ICD-10-CM) - S/P lumbar fusion M43.16 (ICD-10-CM) - Spondylolisthesis at L3-L4 level M54.16 (ICD-10-CM) - Radiculopathy, lumbar region   THERAPY DIAG:  Muscle weakness (generalized)  Other abnormalities of gait and mobility  Other low back pain  Rationale for Evaluation and Treatment Rehabilitation  PERTINENT HISTORY: s/p L3-4 and L4-5 TLIF 08/31/21  PRECAUTIONS: Update 12/10/21: Wean from brace 1/2 days in brace for 2 weeks then discontinue May start progressive ROM of the lumbar spine with core strengthening exercises, no extreme stretching or flexibility training as healing is progressive over the year following the lumbar fusion. LE strengthening and hamstring stretching, aerobic exercise. No lifting greater than 10 lbs   SUBJECTIVE: Patient reports her back is feeling pretty good right now without pain. No pain/soreness following last session.    PAIN:  Are you having pain? None At worst NPRS scale: 4/10 Pain location: Right low back Pain description: achy, sore Aggravating factors: sleep in wrong position  Relieving factors: rest    OBJECTIVE: (objective measures completed at initial evaluation unless otherwise dated) PATIENT SURVEYS:  FOTO 41% (predicted 57%)  11/30/21: 54% 12/13/21: 60%   POSTURE:  rounded shoulders and forward head   LUMBAR ROM:  Deferred due to post-op acuity and current ROM restrictions  Active  A/PROM  eval  Flexion    Extension    Right lateral flexion    Left lateral flexion    Right rotation    Left rotation     (Blank rows = not tested)   LOWER EXTREMITY MMT:     MMT Right eval Left eval  Hip flexion 3+ 4-  Hip extension      Hip abduction 3- 3-  Hip adduction      Hip internal rotation      Hip external rotation      Knee flexion 5 5  Knee extension 5  5  Ankle dorsiflexion 4 4  Ankle plantarflexion      Ankle inversion      Ankle eversion       (Blank rows = not tested)   FUNCTIONAL TESTS:                        SLS: unable 12/07/21: LLE 3 seconds; RLE 1 second : 12/13/21: 3 sec best bilat  5 X STS: 28.3 seconds with BUE support: 12/13/21 : 25.3 sec without UE; 12/30/21: 19.3 seconds  TUG: 35 seconds  11/18/21: 26 seconds with SPC  12/07/21: 23.6 seconds with SPC  12/13/21: 23.0 secounds with SPC   Sit <> stand: requires cues for proper hand placement (continues to put hands on RW) Sit to supine: Min A  Supine to Sit: Mod A  11/22/21: Sit <>supine: Mod I (increased time)   GAIT: - assessed 12/02/2021 Distance walked: - Assistive device utilized: SPC Level of assistance: Modified independence Comments: slow gait speed      TODAY'S TREATMENT  [facility] Adult PT Treatment:                                                DATE: 12/30/21 Therapeutic Exercise: NuStep level 6 x 5 minutes UE/LE Double knees to chest x 10; 5 sec hold LTR with figure 4 x 1 minute each  Hip hinge with 5 lb kettlebell to 14 inch step 2 x 10  Hip hinge with 5 lb kettlebell to 8 inch step 2 x 10  Supine pelvic tilts 1 x 10  SLR with posterior pelvic tilt 2 x 10 each Bridge partial range 1 x 10  Updated HEP  [facility] Adult PT Treatment:                                                DATE: 12/28/21 Therapeutic Exercise: NuStep level 6 x 5 minutes UE/LE  LTR through comfortable range x 1 minute  Double knees to chest x 10  Standing lumbar extension through comfortable range x 10  Wall squats 2 x 10  Hip hinge seated with dowel 1 x 10  Standing hip hinge with dowel 1 x 5  Hip hinge with 5 lb kettlebell to 14 inch step 2 x 5    [facility] Adult PT Treatment:  DATE: 12/23/21 Therapeutic Exercise: Recumbent bike level 2 x 5 minutes  LTR through comfortable range x 60 seconds  Partial range pressup on elbows x 10  Seated pelvic  tilts 2 x 10  Stability ball rollout through comfortable range x 1 minute  Seated hip hinge with dowel x 10   Self Care: Educated on sleep positions, posture, body mechanics with lifting and bending. Handout provided      PATIENT EDUCATION:  Education details:HEP Person educated: patient Education method: demo, handout, cues Education comprehension: returned demo, cues    HOME EXERCISE PROGRAM: Access Code: [email or web address] URL: [email or web address] Date: 12/15/2021 Prepared by: Hessie Diener  Exercises - Seated March  - 2 x daily - 7 x weekly - 2 sets - 10 reps - Seated Long Arc Quad  - 2 x daily - 7 x weekly - 2 sets - 10 reps - Heel Raises with Counter Support  - 2 x daily - 7 x weekly - 2 sets - 10 reps - Supine Bridge  - 1 x daily - 7 x weekly - 2 sets - 10 reps - Small Range Straight Leg Raise  - 1 x daily - 7 x weekly - 2 sets - 10 reps - Hooklying Clamshell with Resistance  - 1 x daily - 7 x weekly - 2 sets - 10 reps - Supine Lower Trunk Rotation  - 1 x daily - 7 x weekly - 1 sets - 5 reps - 5-10 hold - Hooklying Single Knee to Chest  - 1 x daily - 7 x weekly - 1 sets - 2-3 reps - 30 hold - Hooklying Hamstring Stretch with Strap  - 1 x daily - 7 x weekly - 1 sets - 2-3 reps - 30 hold - Pelvic tilt  - 1 x daily - 7 x weekly - 2 sets - 10 reps - 5 hold - Supine March with Posterior Pelvic Tilt  - 1 x daily - 7 x weekly - 2 sets - 10 reps    ASSESSMENT: CLINICAL IMPRESSION:  Patient tolerated session well today with further progression of light-weight lifting activity. She demonstrates excellent form with hip hinge through further range with 5 lb kettle bell without reports of pain. Initial cues required to properly perform pelvic tilts with ability to correct once cued. Continued with further hip strengthening, which she tolerated well without reports of pain. Her 5 x STS score has significantly improved having met this LTG.    OBJECTIVE IMPAIRMENTS Abnormal  gait, decreased activity tolerance, decreased balance, decreased endurance, decreased knowledge of use of DME, decreased mobility, difficulty walking, decreased ROM, decreased strength, improper body mechanics, postural dysfunction, and pain.    ACTIVITY LIMITATIONS carrying, lifting, bending, sitting, standing, squatting, stairs, transfers, bed mobility, bathing, toileting, and locomotion level   PARTICIPATION LIMITATIONS: meal prep, cleaning, laundry, driving, shopping, community activity, and yard work   PERSONAL FACTORS Age and Fitness are also affecting patient's functional outcome.      GOALS: Goals reviewed with patient? Yes   SHORT TERM GOALS: Target date: 12/09/2021   Patient will require no cues for proper hand placement when performing sit to stand to improve safety with transfers.  Baseline: constant cues to not push from the RW; 11/22/21 unable to recall proper placement; 12/07/21: able to safely transfer with her [facility] and recall hand positioning   Goal status: MET   2.  Patient will be Mod I with supine <>sit transfer.  Baseline:  Goal status: MET   3.  Patient will maintain SLS for at least 2 seconds bilaterally to improve gait stability.  Baseline: unable 12/13/21: able to hold SLS for 3 sec bilaterally  Goal status: MET   4.  Patient will complete TUG in </=25 seconds to signify a reduction in fall risk  Baseline:  Goal status: MET     LONG TERM GOALS: Target date: 01/08/2022   Patient will demonstrate proper bending and lifting mechanics to reduce stress on her back.  Baseline: unable  Status:12/13/21:  Is weaning from back brace  Goal status: ONGOING   2.  Patient will ambulate community and household distances with LRAD.  Baseline:  Status: 12/13/21: uses SPC at grocery store , mostly no AD use in home  Goal status: MET   3.  Patient will complete 5 x STS in </= 20 seconds to improve her functional strength.   Baseline:  Goal status: MET    4.  Patient will  demonstrate 4/5 hip flexor strength to improve ability to negotiate stairs and curbs. Baseline: R 3+/5, L 4-/5 Goal status: ONGOING   5.  Patient will demonstrate at least 4-/5 bilateral hip abductor strength to improve stability about the chain with walking activity.  Baseline: 3-/5 bilateral Goal status: ONGOING   6.  Patient will tolerate at least 30 minutes of standing activity in order to cook for her family.  Baseline: see above  Status: 12/13/21: can tolerate 15-20 minutes  Goal status: ONGOING             7. Patient will score at least 57% on FOTO to signify clinically meaningful improvement in functional abilities.                       Baseline: 41% Status: 11/30/21: 54%        Status: 12/13/21: 60%                      Goal status: MET   PLAN: PT FREQUENCY: 2x/week   PT DURATION: 8 weeks   PLANNED INTERVENTIONS: Therapeutic exercises, Therapeutic activity, Neuromuscular re-education, Balance training, Gait training, Patient/Family education, Self Care, Stair training, DME instructions, Cryotherapy, Moist heat, Manual therapy, and Re-evaluation.   PLAN FOR NEXT SESSION: Begin gentle core strength:  Per MD 12/09/21: [place] Bra[place] from brace 1/2 days in brace for 2 weeks then discontinue May start progressive ROM of the lumbar spine with core strengthening exercises, no extreme stretching or flexibility training as healing is progressive over the year following the lumbar fusion. LE strengthening and hamstring stretching, aerobic exercise.  Gwendolyn Grant, PT, DPT, ATC 12/30/21 10:13 AM

## 2022-01-04 ENCOUNTER — Ambulatory Visit: Payer: Medicare Other | Admitting: Physical Therapy

## 2022-01-04 ENCOUNTER — Encounter: Payer: Self-pay | Admitting: Physical Therapy

## 2022-01-04 DIAGNOSIS — R2689 Other abnormalities of gait and mobility: Secondary | ICD-10-CM

## 2022-01-04 DIAGNOSIS — M6281 Muscle weakness (generalized): Secondary | ICD-10-CM | POA: Diagnosis not present

## 2022-01-04 DIAGNOSIS — M5459 Other low back pain: Secondary | ICD-10-CM

## 2022-01-04 NOTE — Therapy (Signed)
OUTPATIENT PHYSICAL THERAPY TREATMENT NOTE      Patient Name: Carol Wilkins MRN: [id number] DOB:March 30, 1956, 66 y.o., female Today's Date: 01/04/2022  PCP: Roselee Nova REFERRING PROVIDER: Jessy Oto, MD  END OF SESSION:   PT End of Session - 01/04/22 0934     Visit Number 16    Number of Visits 17    Date for PT Re-Evaluation 01/08/22    Progress Note Due on Visit 67    PT Start Time 0934    PT Stop Time 1015    PT Time Calculation (min) 41 min                  Past Medical History:  Diagnosis Date   Anxiety attack    Arthritis    Diabetes mellitus without complication ([place])    type 2 on metformin   High cholesterol    Hypertension    Past Surgical History:  Procedure Laterality Date   ABDOMINAL HYSTERECTOMY     in early 31's   CHOLECYSTECTOMY     in the 90's   TEE WITHOUT CARDIOVERSION N/A 07/14/2015   Procedure: TRANSESOPHAGEAL ECHOCARDIOGRAM (TEE);  Surgeon: Adrian Prows, MD;  Location: [facility] ENDOSCOPY;  Service: Cardiovascular;  Laterality: N/A;   [place]   Patient Active Problem List   Diagnosis Date Noted   Spinal stenosis, lumbar region with neurogenic claudication    Spondylolisthesis, lumbar region    Fusion of spine of lumbar region 08/31/2021   Colon cancer screening 09/09/2020   Constipation 09/09/2020   Flatulence, eructation and gas pain 09/09/2020   Irritable bowel syndrome 09/09/2020   Personal history of colonic polyps 09/09/2020   Rectal bleeding 09/09/2020   Pes planus 09/09/2020   Posterior tibial tendon dysfunction (PTTD) of both lower extremities 09/09/2020   Hav (hallux abducto valgus), unspecified laterality 09/09/2020   Capsulitis 09/09/2020   Low back pain 01/25/2012   Abdominal pain 11/02/2011   Obesity (BMI 30.0-34.9) 10/31/2011   Post herpetic neuralgia 09/14/2011   DEPRESSION 12/22/2006   HYPERTENSION 12/22/2006   ALLERGIC RHINITIS 12/22/2006   PALPITATIONS, HX OF  12/22/2006    REFERRING DIAG: Z98.1 (ICD-10-CM) - S/P lumbar fusion M43.16 (ICD-10-CM) - Spondylolisthesis at L3-L4 level M54.16 (ICD-10-CM) - Radiculopathy, lumbar region   THERAPY DIAG:  Muscle weakness (generalized)  Other abnormalities of gait and mobility  Other low back pain  Rationale for Evaluation and Treatment Rehabilitation  PERTINENT HISTORY: s/p L3-4 and L4-5 TLIF 08/31/21  PRECAUTIONS: Update 12/10/21: Wean from brace 1/2 days in brace for 2 weeks then discontinue May start progressive ROM of the lumbar spine with core strengthening exercises, no extreme stretching or flexibility training as healing is progressive over the year following the lumbar fusion. LE strengthening and hamstring stretching, aerobic exercise. No lifting greater than 10 lbs   SUBJECTIVE: Patient reports her back is feeling pretty good right now without pain. No pain/soreness following last session.    PAIN:  Are you having pain? yes At worst NPRS scale: 3/10 Pain location: Right low back Pain description: achy, sore Aggravating factors: sleep in wrong position  Relieving factors: rest    OBJECTIVE: (objective measures completed at initial evaluation unless otherwise dated) PATIENT SURVEYS:  FOTO 41% (predicted 57%)  11/30/21: 54% 12/13/21: 60%   POSTURE: rounded shoulders and forward head   LUMBAR ROM:  Deferred due to post-op acuity and current ROM restrictions  Active  A/PROM  eval  Flexion    Extension    Right lateral flexion    Left lateral flexion    Right rotation    Left rotation     (Blank rows = not tested)   LOWER EXTREMITY MMT:     MMT Right eval Left eval  Hip flexion 3+ 4-  Hip extension      Hip abduction 3- 3-  Hip adduction      Hip internal rotation      Hip external rotation      Knee flexion 5 5  Knee extension 5 5  Ankle dorsiflexion 4 4  Ankle plantarflexion      Ankle inversion      Ankle eversion       (Blank rows = not tested)   FUNCTIONAL  TESTS:                        SLS: unable 12/07/21: LLE 3 seconds; RLE 1 second : 12/13/21: 3 sec best bilat  5 X STS: 28.3 seconds with BUE support: 12/13/21 : 25.3 sec without UE; 12/30/21: 19.3 seconds  TUG: 35 seconds  11/18/21: 26 seconds with SPC  12/07/21: 23.6 seconds with SPC  12/13/21: 23.0 secounds with SPC   Sit <> stand: requires cues for proper hand placement (continues to put hands on RW) Sit to supine: Min A  Supine to Sit: Mod A  11/22/21: Sit <>supine: Mod I (increased time)   GAIT: - assessed 12/02/2021 Distance walked: - Assistive device utilized: SPC Level of assistance: Modified independence Comments: slow gait speed      TODAY'S TREATMENT  [facility] Adult PT Treatment:                                                DATE: 01/04/22 Therapeutic Exercise: NuStep level 6 x 6 minutes UE/LE Seated ball stretch for lumbar flexion from chair  Hip hinge with 5 lb kettlebell to 14 inch step 1 x 10  Hip hinge with 5lb KB -squat to chair 10 x 2  Seated ball stretch for lumbar flexion from chair  PPT PPT with small bridge - increased pain today Green band clam x 20  LTR with figure 4 x 1 minute  PPT with SLR x 10 each  Double knees to chest x 10; 5 sec hold  [facility] Adult PT Treatment:                                                DATE: 12/30/21 Therapeutic Exercise: NuStep level 6 x 5 minutes UE/LE Double knees to chest x 10; 5 sec hold LTR with figure 4 x 1 minute each  Hip hinge with 5 lb kettlebell to 14 inch step 2 x 10  Hip hinge with 5 lb kettlebell to 8 inch step 2 x 10  Supine pelvic tilts 1 x 10  SLR with posterior pelvic tilt 2 x 10 each Bridge partial range 1 x 10  Updated HEP  [facility] Adult PT Treatment:  DATE: 12/28/21 Therapeutic Exercise: NuStep level 6 x 5 minutes UE/LE  LTR through comfortable range x 1 minute  Double knees to chest x 10  Standing lumbar extension through comfortable range x 10  Wall squats 2 x 10   Hip hinge seated with dowel 1 x 10  Standing hip hinge with dowel 1 x 5  Hip hinge with 5 lb kettlebell to 14 inch step 2 x 5    [facility] Adult PT Treatment:                                                DATE: 12/23/21 Therapeutic Exercise: Recumbent bike level 2 x 5 minutes  LTR through comfortable range x 60 seconds  Partial range pressup on elbows x 10  Seated pelvic tilts 2 x 10  Stability ball rollout through comfortable range x 1 minute  Seated hip hinge with dowel x 10   Self Care: Educated on sleep positions, posture, body mechanics with lifting and bending. Handout provided      PATIENT EDUCATION:  Education details:HEP Person educated: patient Education method: demo, handout, cues Education comprehension: returned demo, cues    HOME EXERCISE PROGRAM: Access Code: [email or web address] URL: [email or web address] Date: 12/15/2021 Prepared by: Hessie Diener  Exercises - Seated March  - 2 x daily - 7 x weekly - 2 sets - 10 reps - Seated Long Arc Quad  - 2 x daily - 7 x weekly - 2 sets - 10 reps - Heel Raises with Counter Support  - 2 x daily - 7 x weekly - 2 sets - 10 reps - Supine Bridge  - 1 x daily - 7 x weekly - 2 sets - 10 reps - Small Range Straight Leg Raise  - 1 x daily - 7 x weekly - 2 sets - 10 reps - Hooklying Clamshell with Resistance  - 1 x daily - 7 x weekly - 2 sets - 10 reps - Supine Lower Trunk Rotation  - 1 x daily - 7 x weekly - 1 sets - 5 reps - 5-10 hold - Hooklying Single Knee to Chest  - 1 x daily - 7 x weekly - 1 sets - 2-3 reps - 30 hold - Hooklying Hamstring Stretch with Strap  - 1 x daily - 7 x weekly - 1 sets - 2-3 reps - 30 hold - Pelvic tilt  - 1 x daily - 7 x weekly - 2 sets - 10 reps - 5 hold - Supine March with Posterior Pelvic Tilt  - 1 x daily - 7 x weekly - 2 sets - 10 reps    ASSESSMENT: CLINICAL IMPRESSION:  Patient tolerated session well today with further progression of light-weight lifting activity. She reports increased  tightness today in low back with increased pain at attempts to lift 5# KB from 14 inch and 8 inch heights. She was able to hold 5# KB during STS reps from standard chair without increased pain. Continued with mat based core and hip strengthening. She had pain today with attempts at small range bridging.     OBJECTIVE IMPAIRMENTS Abnormal gait, decreased activity tolerance, decreased balance, decreased endurance, decreased knowledge of use of DME, decreased mobility, difficulty walking, decreased ROM, decreased strength, improper body mechanics, postural dysfunction, and pain.    ACTIVITY LIMITATIONS carrying, lifting, bending, sitting, standing, squatting, stairs, transfers, bed mobility,  bathing, toileting, and locomotion level   PARTICIPATION LIMITATIONS: meal prep, cleaning, laundry, driving, shopping, community activity, and yard work   PERSONAL FACTORS Age and Fitness are also affecting patient's functional outcome.      GOALS: Goals reviewed with patient? Yes   SHORT TERM GOALS: Target date: 12/09/2021   Patient will require no cues for proper hand placement when performing sit to stand to improve safety with transfers.  Baseline: constant cues to not push from the RW; 11/22/21 unable to recall proper placement; 12/07/21: able to safely transfer with her [facility] and recall hand positioning   Goal status: MET   2.  Patient will be Mod I with supine <>sit transfer.  Baseline:  Goal status: MET   3.  Patient will maintain SLS for at least 2 seconds bilaterally to improve gait stability.  Baseline: unable 12/13/21: able to hold SLS for 3 sec bilaterally  Goal status: MET   4.  Patient will complete TUG in </=25 seconds to signify a reduction in fall risk  Baseline:  Goal status: MET     LONG TERM GOALS: Target date: 01/08/2022   Patient will demonstrate proper bending and lifting mechanics to reduce stress on her back.  Baseline: unable  Status:12/13/21:  Is weaning from back brace   Goal status: ONGOING   2.  Patient will ambulate community and household distances with LRAD.  Baseline:  Status: 12/13/21: uses SPC at grocery store , mostly no AD use in home  Goal status: MET   3.  Patient will complete 5 x STS in </= 20 seconds to improve her functional strength.   Baseline:  Goal status: MET    4.  Patient will demonstrate 4/5 hip flexor strength to improve ability to negotiate stairs and curbs. Baseline: R 3+/5, L 4-/5 Goal status: ONGOING   5.  Patient will demonstrate at least 4-/5 bilateral hip abductor strength to improve stability about the chain with walking activity.  Baseline: 3-/5 bilateral Goal status: ONGOING   6.  Patient will tolerate at least 30 minutes of standing activity in order to cook for her family.  Baseline: see above  Status: 12/13/21: can tolerate 15-20 minutes  Status: 01/04/22: can tolerate 30 minutes in store and kitchen Goal status: MET             7. Patient will score at least 57% on FOTO to signify clinically meaningful improvement in functional abilities.                       Baseline: 41% Status: 11/30/21: 54%        Status: 12/13/21: 60%                      Goal status: MET   PLAN: PT FREQUENCY: 2x/week   PT DURATION: 8 weeks   PLANNED INTERVENTIONS: Therapeutic exercises, Therapeutic activity, Neuromuscular re-education, Balance training, Gait training, Patient/Family education, Self Care, Stair training, DME instructions, Cryotherapy, Moist heat, Manual therapy, and Re-evaluation.   PLAN FOR NEXT SESSION: check MMT, LTGs    Hessie Diener, PTA 01/04/22 10:15 AM Phone: [phone] Fax: [phone]

## 2022-01-05 NOTE — Therapy (Signed)
OUTPATIENT PHYSICAL THERAPY TREATMENT NOTE/RE-CERTIFICATION  Progress Note Reporting Period 11/11/21 to 01/06/22  See note below for Objective Data and Assessment of Progress/Goals.          Patient Name: Carol Wilkins MRN: [id number] DOB:[birth year], 66 y.o., female Today's Date: 01/06/2022  PCP: Roselee Nova REFERRING PROVIDER: Jessy Oto, MD  END OF SESSION:   PT End of Session - 01/06/22 0932     Visit Number 17    Number of Visits 22    Date for PT Re-Evaluation 02/05/22    Authorization Type UHC MCR    Progress Note Due on Visit 68    PT Start Time 0932    PT Stop Time 1015    PT Time Calculation (min) 43 min    Activity Tolerance Patient tolerated treatment well    Behavior During Therapy WFL for tasks assessed/performed                   Past Medical History:  Diagnosis Date   Anxiety attack    Arthritis    Diabetes mellitus without complication ([place])    type 2 on metformin   High cholesterol    Hypertension    Past Surgical History:  Procedure Laterality Date   ABDOMINAL HYSTERECTOMY     in early 54's   CHOLECYSTECTOMY     in the 90's   TEE WITHOUT CARDIOVERSION N/A 07/14/2015   Procedure: TRANSESOPHAGEAL ECHOCARDIOGRAM (TEE);  Surgeon: Adrian Prows, MD;  Location: [facility] ENDOSCOPY;  Service: Cardiovascular;  Laterality: N/A;   [place]   Patient Active Problem List   Diagnosis Date Noted   Spinal stenosis, lumbar region with neurogenic claudication    Spondylolisthesis, lumbar region    Fusion of spine of lumbar region 08/31/2021   Colon cancer screening 09/09/2020   Constipation 09/09/2020   Flatulence, eructation and gas pain 09/09/2020   Irritable bowel syndrome 09/09/2020   Personal history of colonic polyps 09/09/2020   Rectal bleeding 09/09/2020   Pes planus 09/09/2020   Posterior tibial tendon dysfunction (PTTD) of both lower extremities 09/09/2020   Hav (hallux abducto valgus),  unspecified laterality 09/09/2020   Capsulitis 09/09/2020   Low back pain 01/25/2012   Abdominal pain 11/02/2011   Obesity (BMI 30.0-34.9) 10/31/2011   Post herpetic neuralgia 09/14/2011   DEPRESSION 12/22/2006   HYPERTENSION 12/22/2006   ALLERGIC RHINITIS 12/22/2006   PALPITATIONS, HX OF 12/22/2006    REFERRING DIAG: Z98.1 (ICD-10-CM) - S/P lumbar fusion M43.16 (ICD-10-CM) - Spondylolisthesis at L3-L4 level M54.16 (ICD-10-CM) - Radiculopathy, lumbar region   THERAPY DIAG:  Muscle weakness (generalized)  Other abnormalities of gait and mobility  Other low back pain  Rationale for Evaluation and Treatment Rehabilitation  PERTINENT HISTORY: s/p L3-4 and L4-5 TLIF 08/31/21  PRECAUTIONS: Update 12/10/21: Wean from brace 1/2 days in brace for 2 weeks then discontinue May start progressive ROM of the lumbar spine with core strengthening exercises, no extreme stretching or flexibility training as healing is progressive over the year following the lumbar fusion. LE strengthening and hamstring stretching, aerobic exercise. No lifting greater than 10 lbs   SUBJECTIVE: Patient reports she is doing alright and feeling better than Tuesday. She hasn't completed a lot of household activities yet (sweeping, mopping, vacuuming, cleaning the tub) due to hesitancy to perform.      PAIN:  Are you having pain? None currently  At worst NPRS scale: at worst 4/10 Pain location: Right  low back Pain description: achy, sore Aggravating factors: laying on the Rt side  Relieving factors: rest    OBJECTIVE: (objective measures completed at initial evaluation unless otherwise dated) PATIENT SURVEYS:  FOTO 41% (predicted 57%)  11/30/21: 54% 12/13/21: 60%   POSTURE: rounded shoulders and forward head   LUMBAR ROM:  Deferred due to post-op acuity and current ROM restrictions  Active  A/PROM  01/06/22  Flexion  25% limited   Extension  50% limited   Right lateral flexion  25% limited  Left lateral  flexion  25% limited   Right rotation  [facility]  Left rotation  WFL   (Blank rows = not tested)   LOWER EXTREMITY MMT:     MMT Right eval Left eval 01/06/22 Right 01/06/22 Left   Hip flexion 3+ 4- 4- 4-  Hip extension        Hip abduction 3- 3- 4- 3+  Hip adduction        Hip internal rotation        Hip external rotation        Knee flexion _0 Knee extension _1 Ankle dorsiflexion _2 Ankle plantarflexion        Ankle inversion        Ankle eversion         (Blank rows = not tested)   FUNCTIONAL TESTS:                        SLS: unable 12/07/21: LLE 3 seconds; RLE 1 second :  12/13/21: 3 sec best bilat  01/06/22: 4 sec LLE; 2 sec RLE  5 X STS: 28.3 seconds with BUE support:  12/13/21 : 25.3 sec without UE 12/30/21: 19.3 seconds   TUG: 35 seconds  11/18/21: 26 seconds with SPC  12/07/21: 23.6 seconds with SPC  12/13/21: 23.0 secounds with SPC    Sit <> stand: requires cues for proper hand placement (continues to put hands on RW) Sit to supine: Min A  Supine to Sit: Mod A  11/22/21: Sit <>supine: Mod I (increased time)   GAIT: - assessed 12/02/2021 Distance walked: - Assistive device utilized: SPC Level of assistance: Modified independence Comments: slow gait speed      TODAY'S TREATMENT  [facility] Adult PT Treatment:                                                DATE: 01/06/22 Therapeutic Exercise: NuStep level 6 x 5 minutes UE/LE Mini lunge at counter 2 x 10 bilateral  Sidelying hip abduction 2 x 10 bilateral  SLR 2 x 10 bilateral  Updated HEP  Therapeutic Activity: Re-assessment to determine overall progress educating patient on progress towards goals and impairments that will be addressed throughout POC Sled pushing 2 x 50 ft (no weight) Sled pulling 2 x 50 ft (no weight)    [facility] Adult PT Treatment:                                                DATE: 01/04/22 Therapeutic Exercise: NuStep level 6 x 6 minutes UE/LE Seated ball stretch for lumbar  flexion from chair  Hip hinge with 5 lb kettlebell to 14 inch step 1 x 10  Hip hinge with 5lb KB -squat to chair 10 x 2  Seated ball stretch for lumbar flexion from chair  PPT PPT with small bridge - increased pain today Green band clam x 20  LTR with figure 4 x 1 minute  PPT with SLR x 10 each  Double knees to chest x 10; 5 sec hold  [facility] Adult PT Treatment:                                                DATE: 12/30/21 Therapeutic Exercise: NuStep level 6 x 5 minutes UE/LE Double knees to chest x 10; 5 sec hold LTR with figure 4 x 1 minute each  Hip hinge with 5 lb kettlebell to 14 inch step 2 x 10  Hip hinge with 5 lb kettlebell to 8 inch step 2 x 10  Supine pelvic tilts 1 x 10  SLR with posterior pelvic tilt 2 x 10 each Bridge partial range 1 x 10  Updated HEP    PATIENT EDUCATION:  Education details:see treatment  Person educated: patient Education method: verbal instruction, handout, cues Education comprehension: verbalized understanding, cues     HOME EXERCISE PROGRAM: Access Code: [email or web address] URL: [email or web address] Date: 12/15/2021 Prepared by: Jessica Donoho  Exercises - Seated March  - 2 x daily - 7 x weekly - 2 sets - 10 reps - Seated Long Arc Quad  - 2 x daily - 7 x weekly - 2 sets - 10 reps - Heel Raises with Counter Support  - 2 x daily - 7 x weekly - 2 sets - 10 reps - Supine Bridge  - 1 x daily - 7 x weekly - 2 sets - 10 reps - Small Range Straight Leg Raise  - 1 x daily - 7 x weekly - 2 sets - 10 reps - Hooklying Clamshell with Resistance  - 1 x daily - 7 x weekly - 2 sets - 10 reps - Supine Lower Trunk Rotation  - 1 x daily - 7 x weekly - 1 sets - 5 reps - 5-10 hold - Hooklying Single Knee to Chest  - 1 x daily - 7 x weekly - 1 sets - 2-3 reps - 30 hold - Hooklying Hamstring Stretch with Strap  - 1 x daily - 7 x weekly - 1 sets - 2-3 reps - 30 hold - Pelvic tilt  - 1 x daily - 7 x weekly - 2 sets - 10 reps - 5 hold - Supine March with  Posterior Pelvic Tilt  - 1 x daily - 7 x weekly - 2 sets - 10 reps    ASSESSMENT: CLINICAL IMPRESSION: Majorie is progressing well s/p L3-4 and L4-5 TLIF on 08/31/21. She has met all short term functional goals and 4/8 long term functional goals. She demonstrates improvements in strength, balance, gait speed/stability, and independence with transfers since initial evaluation. She reports limitations in household activities currently mostly related to cleaning and lifting. She will benefit from continued skilled PT to further progress her functional strength and safely progress her lifting in order to optimize her function and independence with IADLs.     OBJECTIVE IMPAIRMENTS Abnormal gait, decreased activity tolerance, decreased balance, decreased endurance, decreased knowledge of use of DME, decreased mobility, difficulty walking, decreased ROM, decreased   strength, improper body mechanics, postural dysfunction, and pain.    ACTIVITY LIMITATIONS carrying, lifting, bending, sitting, standing, squatting, stairs, transfers, bed mobility, bathing, toileting, and locomotion level   PARTICIPATION LIMITATIONS: meal prep, cleaning, laundry, driving, shopping, community activity, and yard work   PERSONAL FACTORS Age and Fitness are also affecting patient's functional outcome.      GOALS: Goals reviewed with patient? Yes   SHORT TERM GOALS: Target date: 12/09/2021   Patient will require no cues for proper hand placement when performing sit to stand to improve safety with transfers.  Baseline: constant cues to not push from the RW; 11/22/21 unable to recall proper placement; 12/07/21: able to safely transfer with her [facility] and recall hand positioning   Goal status: MET   2.  Patient will be Mod I with supine <>sit transfer.  Baseline:  Goal status: MET   3.  Patient will maintain SLS for at least 2 seconds bilaterally to improve gait stability.  Baseline: unable 12/13/21: able to hold SLS for 3 sec  bilaterally  Goal status: MET   4.  Patient will complete TUG in </=25 seconds to signify a reduction in fall risk  Baseline:  Goal status: MET     LONG TERM GOALS: Target date: 01/08/2022   Patient will demonstrate proper bending and lifting mechanics to reduce stress on her back.  Baseline: unable  Status:12/13/21:  Is weaning from back brace  Status: 01/06/22: progressing lifting as appropriate  Goal status: ONGOING   2.  Patient will ambulate community and household distances with LRAD.  Baseline:  Status: 12/13/21: uses SPC at grocery store , mostly no AD use in home  Goal status: MET   3.  Patient will complete 5 x STS in </= 20 seconds to improve her functional strength.   Baseline:  Goal status: MET    4.  Patient will demonstrate 4/5 hip flexor strength to improve ability to negotiate stairs and curbs. Baseline: R 3+/5, L 4-/5 Goal status: ONGOING   5.  Patient will demonstrate at least 4-/5 bilateral hip abductor strength to improve stability about the chain with walking activity.  Baseline: 3-/5 bilateral Goal status: partially met    6.  Patient will tolerate at least 30 minutes of standing activity in order to cook for her family.  Baseline: see above  Status: 12/13/21: can tolerate 15-20 minutes  Status: 01/04/22: can tolerate 30 minutes in store and kitchen Goal status: MET             7. Patient will score at least 57% on FOTO to signify clinically meaningful improvement in functional abilities.                       Baseline: 41% Status: 11/30/21: 54%        Status: 12/13/21: 60%                      Goal status: MET   8. Patient will report no limitations with cleaning activities around her home as it relates to her back.    Baseline: has not completed sweeping, mopping, vacuuming, and cleaning bath tub   Goal Status: Initial  PLAN: PT FREQUENCY: 2x/week for 1 week, then 1 x week   PT DURATION: 4 weeks   PLANNED INTERVENTIONS: Therapeutic exercises,  Therapeutic activity, Neuromuscular re-education, Balance training, Gait training, Patient/Family education, Self Care, Stair training, DME instructions, Cryotherapy, Moist heat, Manual therapy, and Re-evaluation.  PLAN FOR NEXT SESSION: lifting/bending, pushing/pulling   Samantha [place], PT, DPT, ATC 01/06/22 10:16 AM  

## 2022-01-06 ENCOUNTER — Encounter: Payer: Self-pay | Admitting: Specialist

## 2022-01-06 ENCOUNTER — Ambulatory Visit: Payer: Medicare Other

## 2022-01-06 DIAGNOSIS — R2689 Other abnormalities of gait and mobility: Secondary | ICD-10-CM

## 2022-01-06 DIAGNOSIS — M5459 Other low back pain: Secondary | ICD-10-CM

## 2022-01-06 DIAGNOSIS — M6281 Muscle weakness (generalized): Secondary | ICD-10-CM

## 2022-01-10 NOTE — Therapy (Signed)
OUTPATIENT PHYSICAL THERAPY TREATMENT NOTE         Patient Name: Carol Wilkins MRN: [id number] DOB:[birth year], 66 y.o., female Today's Date: 01/11/2022  PCP: Roselee Nova REFERRING PROVIDER: Jessy Oto, MD  END OF SESSION:   PT End of Session - 01/11/22 0932     Visit Number 18    Number of Visits 22    Date for PT Re-Evaluation 02/05/22    Authorization Type UHC MCR    Progress Note Due on Visit 27    PT Start Time 0930    PT Stop Time 1011    PT Time Calculation (min) 41 min    Activity Tolerance Patient tolerated treatment well    Behavior During Therapy WFL for tasks assessed/performed                    Past Medical History:  Diagnosis Date   Anxiety attack    Arthritis    Diabetes mellitus without complication ([place])    type 2 on metformin   High cholesterol    Hypertension    Past Surgical History:  Procedure Laterality Date   ABDOMINAL HYSTERECTOMY     in early 2's   CHOLECYSTECTOMY     in the 90's   TEE WITHOUT CARDIOVERSION N/A 07/14/2015   Procedure: TRANSESOPHAGEAL ECHOCARDIOGRAM (TEE);  Surgeon: Adrian Prows, MD;  Location: [facility] ENDOSCOPY;  Service: Cardiovascular;  Laterality: N/A;   [place]   Patient Active Problem List   Diagnosis Date Noted   Spinal stenosis, lumbar region with neurogenic claudication    Spondylolisthesis, lumbar region    Fusion of spine of lumbar region 08/31/2021   Colon cancer screening 09/09/2020   Constipation 09/09/2020   Flatulence, eructation and gas pain 09/09/2020   Irritable bowel syndrome 09/09/2020   Personal history of colonic polyps 09/09/2020   Rectal bleeding 09/09/2020   Pes planus 09/09/2020   Posterior tibial tendon dysfunction (PTTD) of both lower extremities 09/09/2020   Hav (hallux abducto valgus), unspecified laterality 09/09/2020   Capsulitis 09/09/2020   Low back pain 01/25/2012   Abdominal pain 11/02/2011   Obesity (BMI 30.0-34.9)  10/31/2011   Post herpetic neuralgia 09/14/2011   DEPRESSION 12/22/2006   HYPERTENSION 12/22/2006   ALLERGIC RHINITIS 12/22/2006   PALPITATIONS, HX OF 12/22/2006    REFERRING DIAG: Z98.1 (ICD-10-CM) - S/P lumbar fusion M43.16 (ICD-10-CM) - Spondylolisthesis at L3-L4 level M54.16 (ICD-10-CM) - Radiculopathy, lumbar region   THERAPY DIAG:  Muscle weakness (generalized)  Other abnormalities of gait and mobility  Other low back pain  Rationale for Evaluation and Treatment Rehabilitation  PERTINENT HISTORY: s/p L3-4 and L4-5 TLIF 08/31/21  PRECAUTIONS: Update 12/10/21: Wean from brace 1/2 days in brace for 2 weeks then discontinue May start progressive ROM of the lumbar spine with core strengthening exercises, no extreme stretching or flexibility training as healing is progressive over the year following the lumbar fusion. LE strengthening and hamstring stretching, aerobic exercise. No lifting greater than 10 lbs  Update: 01/11/22: per provider note can start lifting up to 25-30 lbs gradually working up to this   SUBJECTIVE: Patient reports she is feeling ok without back pain currently. She hasn't done any house cleaning since last visit, but was able to do some baking without onset of pain.    PAIN:  Are you having pain? None currently  At worst NPRS scale: at worst 1/10 Pain location: Right low back Pain description:  achy, sore Aggravating factors: laying on the Rt side  Relieving factors: rest    OBJECTIVE: (objective measures completed at initial evaluation unless otherwise dated) PATIENT SURVEYS:  FOTO 41% (predicted 57%)  11/30/21: 54% 12/13/21: 60%   POSTURE: rounded shoulders and forward head   LUMBAR ROM:  Deferred due to post-op acuity and current ROM restrictions  Active  A/PROM  01/06/22  Flexion  25% limited   Extension  50% limited   Right lateral flexion  25% limited  Left lateral flexion  25% limited   Right rotation  [facility]  Left rotation  WFL   (Blank rows =  not tested)   LOWER EXTREMITY MMT:     MMT Right eval Left eval 01/06/22 Right 01/06/22 Left   Hip flexion 3+ 4- 4- 4-  Hip extension        Hip abduction 3- 3- 4- 3+  Hip adduction        Hip internal rotation        Hip external rotation        Knee flexion 5 5 5 5   Knee extension 5 5 5 5   Ankle dorsiflexion 4 4 5 5   Ankle plantarflexion        Ankle inversion        Ankle eversion         (Blank rows = not tested)   FUNCTIONAL TESTS:                        SLS: unable 12/07/21: LLE 3 seconds; RLE 1 second :  12/13/21: 3 sec best bilat  01/06/22: 4 sec LLE; 2 sec RLE  5 X STS: 28.3 seconds with BUE support:  12/13/21 : 25.3 sec without UE 12/30/21: 19.3 seconds   TUG: 35 seconds  11/18/21: 26 seconds with SPC  12/07/21: 23.6 seconds with SPC  12/13/21: 23.0 secounds with SPC    Sit <> stand: requires cues for proper hand placement (continues to put hands on RW) Sit to supine: Min A  Supine to Sit: Mod A  11/22/21: Sit <>supine: Mod I (increased time)   GAIT: - assessed 12/02/2021 Distance walked: - Assistive device utilized: SPC Level of assistance: Modified independence Comments: slow gait speed      TODAY'S TREATMENT  [facility] Adult PT Treatment:                                                DATE: 01/10/22 Therapeutic Exercise: NuStep level 6 x 5 minutes UE/LE  Squat to chair 2 x 8; 10 lbs  Mini lunge with single UE support 2 x 10 bilateral  Resisted row 2 x 10 @ 10 lbs   Therapeutic Activity: Farmers carry 5 lb kettlebell in each hand x 185 ft  Sled pushing 2 x 50 ft @ 20 lbs Sled pulling 2 x 50 ft (no weight)   [facility] Adult PT Treatment:                                                DATE: 01/06/22 Therapeutic Exercise: NuStep level 6 x 5 minutes UE/LE Mini lunge at counter 2 x 10 bilateral  Sidelying hip abduction 2 x 10  bilateral  SLR 2 x 10 bilateral  Updated HEP  Therapeutic Activity: Re-assessment to determine overall progress educating patient on  progress towards goals and impairments that will be addressed throughout POC Sled pushing 2 x 50 ft (no weight) Sled pulling 2 x 50 ft (no weight)    [facility] Adult PT Treatment:                                                DATE: 01/04/22 Therapeutic Exercise: NuStep level 6 x 6 minutes UE/LE Seated ball stretch for lumbar flexion from chair  Hip hinge with 5 lb kettlebell to 14 inch step 1 x 10  Hip hinge with 5lb KB -squat to chair 10 x 2  Seated ball stretch for lumbar flexion from chair  PPT PPT with small bridge - increased pain today Green band clam x 20  LTR with figure 4 x 1 minute  PPT with SLR x 10 each  Double knees to chest x 10; 5 sec hold  [facility] Adult PT Treatment:                                                DATE: 12/30/21 Therapeutic Exercise: NuStep level 6 x 5 minutes UE/LE Double knees to chest x 10; 5 sec hold LTR with figure 4 x 1 minute each  Hip hinge with 5 lb kettlebell to 14 inch step 2 x 10  Hip hinge with 5 lb kettlebell to 8 inch step 2 x 10  Supine pelvic tilts 1 x 10  SLR with posterior pelvic tilt 2 x 10 each Bridge partial range 1 x 10  Updated HEP    PATIENT EDUCATION:  Education details:n/a Person educated: n/a Education method: n/a Education comprehension: n/a   HOME EXERCISE PROGRAM: Access Code: [email or web address] URL: [email or web address] Date: 12/15/2021 Prepared by: Hessie Diener  Exercises - Seated March  - 2 x daily - 7 x weekly - 2 sets - 10 reps - Seated Long Arc Quad  - 2 x daily - 7 x weekly - 2 sets - 10 reps - Heel Raises with Counter Support  - 2 x daily - 7 x weekly - 2 sets - 10 reps - Supine Bridge  - 1 x daily - 7 x weekly - 2 sets - 10 reps - Small Range Straight Leg Raise  - 1 x daily - 7 x weekly - 2 sets - 10 reps - Hooklying Clamshell with Resistance  - 1 x daily - 7 x weekly - 2 sets - 10 reps - Supine Lower Trunk Rotation  - 1 x daily - 7 x weekly - 1 sets - 5 reps - 5-10 hold - Hooklying Single Knee  to Chest  - 1 x daily - 7 x weekly - 1 sets - 2-3 reps - 30 hold - Hooklying Hamstring Stretch with Strap  - 1 x daily - 7 x weekly - 1 sets - 2-3 reps - 30 hold - Pelvic tilt  - 1 x daily - 7 x weekly - 2 sets - 10 reps - 5 hold - Supine March with Posterior Pelvic Tilt  - 1 x daily - 7 x weekly - 2 sets - 10 reps  ASSESSMENT: CLINICAL IMPRESSION: Patient reached out to Dr. Louanne Skye who has cleared her to gradually increase her lifting to 25-30 lbs. Today's session focused on progression of functional strengthening. She demonstrates good form with squatting activity with 10 lbs and no reports of back pain. She was able to carry 10 lbs for household distance with excellent gait stability and no reports of pain. Continued with pushing and pulling activity adding resistance to pushing, which she tolerated well without reports of back pain.     OBJECTIVE IMPAIRMENTS Abnormal gait, decreased activity tolerance, decreased balance, decreased endurance, decreased knowledge of use of DME, decreased mobility, difficulty walking, decreased ROM, decreased strength, improper body mechanics, postural dysfunction, and pain.    ACTIVITY LIMITATIONS carrying, lifting, bending, sitting, standing, squatting, stairs, transfers, bed mobility, bathing, toileting, and locomotion level   PARTICIPATION LIMITATIONS: meal prep, cleaning, laundry, driving, shopping, community activity, and yard work   PERSONAL FACTORS Age and Fitness are also affecting patient's functional outcome.      GOALS: Goals reviewed with patient? Yes   SHORT TERM GOALS: Target date: 12/09/2021   Patient will require no cues for proper hand placement when performing sit to stand to improve safety with transfers.  Baseline: constant cues to not push from the RW; 11/22/21 unable to recall proper placement; 12/07/21: able to safely transfer with her [facility] and recall hand positioning   Goal status: MET   2.  Patient will be Mod I with supine <>sit  transfer.  Baseline:  Goal status: MET   3.  Patient will maintain SLS for at least 2 seconds bilaterally to improve gait stability.  Baseline: unable 12/13/21: able to hold SLS for 3 sec bilaterally  Goal status: MET   4.  Patient will complete TUG in </=25 seconds to signify a reduction in fall risk  Baseline:  Goal status: MET     LONG TERM GOALS: Target date: 01/08/2022   Patient will demonstrate proper bending and lifting mechanics to reduce stress on her back.  Baseline: unable  Status:12/13/21:  Is weaning from back brace  Status: 01/06/22: progressing lifting as appropriate  Goal status: ONGOING   2.  Patient will ambulate community and household distances with LRAD.  Baseline:  Status: 12/13/21: uses SPC at grocery store , mostly no AD use in home  Goal status: MET   3.  Patient will complete 5 x STS in </= 20 seconds to improve her functional strength.   Baseline:  Goal status: MET    4.  Patient will demonstrate 4/5 hip flexor strength to improve ability to negotiate stairs and curbs. Baseline: R 3+/5, L 4-/5 Goal status: ONGOING   5.  Patient will demonstrate at least 4-/5 bilateral hip abductor strength to improve stability about the chain with walking activity.  Baseline: 3-/5 bilateral Goal status: partially met    6.  Patient will tolerate at least 30 minutes of standing activity in order to cook for her family.  Baseline: see above  Status: 12/13/21: can tolerate 15-20 minutes  Status: 01/04/22: can tolerate 30 minutes in store and kitchen Goal status: MET             7. Patient will score at least 57% on FOTO to signify clinically meaningful improvement in functional abilities.                       Baseline: 41% Status: 11/30/21: 54%        Status: 12/13/21: 60%  Goal status: MET   8. Patient will report no limitations with cleaning activities around her home as it relates to her back.    Baseline: has not completed sweeping, mopping,  vacuuming, and cleaning bath tub   Goal Status: Initial  PLAN: PT FREQUENCY: 2x/week for 1 week, then 1 x week   PT DURATION: 4 weeks   PLANNED INTERVENTIONS: Therapeutic exercises, Therapeutic activity, Neuromuscular re-education, Balance training, Gait training, Patient/Family education, Self Care, Stair training, DME instructions, Cryotherapy, Moist heat, Manual therapy, and Re-evaluation.   PLAN FOR NEXT SESSION: lifting/bending, pushing/pulling, progress to floor transfer   Gwendolyn Grant, PT, DPT, ATC 01/11/22 10:12 AM

## 2022-01-11 ENCOUNTER — Ambulatory Visit: Payer: Medicare Other

## 2022-01-11 DIAGNOSIS — M5459 Other low back pain: Secondary | ICD-10-CM

## 2022-01-11 DIAGNOSIS — R2689 Other abnormalities of gait and mobility: Secondary | ICD-10-CM

## 2022-01-11 DIAGNOSIS — M6281 Muscle weakness (generalized): Secondary | ICD-10-CM

## 2022-01-12 NOTE — Therapy (Signed)
OUTPATIENT PHYSICAL THERAPY TREATMENT NOTE         Patient Name: Carol Wilkins MRN: [id number] DOB:[birth year], 66 y.o., female Today's Date: 01/13/2022  PCP: Roselee Nova REFERRING PROVIDER: Jessy Oto, MD  END OF SESSION:   PT End of Session - 01/13/22 0930     Visit Number 19    Number of Visits 22    Date for PT Re-Evaluation 02/05/22    Authorization Type UHC MCR    Progress Note Due on Visit 75    PT Start Time 0930    PT Stop Time 1012    PT Time Calculation (min) 42 min    Activity Tolerance Patient tolerated treatment well    Behavior During Therapy WFL for tasks assessed/performed                     Past Medical History:  Diagnosis Date   Anxiety attack    Arthritis    Diabetes mellitus without complication ([place])    type 2 on metformin   High cholesterol    Hypertension    Past Surgical History:  Procedure Laterality Date   ABDOMINAL HYSTERECTOMY     in early 37's   CHOLECYSTECTOMY     in the 90's   TEE WITHOUT CARDIOVERSION N/A 07/14/2015   Procedure: TRANSESOPHAGEAL ECHOCARDIOGRAM (TEE);  Surgeon: Adrian Prows, MD;  Location: [facility] ENDOSCOPY;  Service: Cardiovascular;  Laterality: N/A;   [place]   Patient Active Problem List   Diagnosis Date Noted   Spinal stenosis, lumbar region with neurogenic claudication    Spondylolisthesis, lumbar region    Fusion of spine of lumbar region 08/31/2021   Colon cancer screening 09/09/2020   Constipation 09/09/2020   Flatulence, eructation and gas pain 09/09/2020   Irritable bowel syndrome 09/09/2020   Personal history of colonic polyps 09/09/2020   Rectal bleeding 09/09/2020   Pes planus 09/09/2020   Posterior tibial tendon dysfunction (PTTD) of both lower extremities 09/09/2020   Hav (hallux abducto valgus), unspecified laterality 09/09/2020   Capsulitis 09/09/2020   Low back pain 01/25/2012   Abdominal pain 11/02/2011   Obesity (BMI 30.0-34.9)  10/31/2011   Post herpetic neuralgia 09/14/2011   DEPRESSION 12/22/2006   HYPERTENSION 12/22/2006   ALLERGIC RHINITIS 12/22/2006   PALPITATIONS, HX OF 12/22/2006    REFERRING DIAG: Z98.1 (ICD-10-CM) - S/P lumbar fusion M43.16 (ICD-10-CM) - Spondylolisthesis at L3-L4 level M54.16 (ICD-10-CM) - Radiculopathy, lumbar region   THERAPY DIAG:  Muscle weakness (generalized)  Other abnormalities of gait and mobility  Other low back pain  Rationale for Evaluation and Treatment Rehabilitation  PERTINENT HISTORY: s/p L3-4 and L4-5 TLIF 08/31/21  PRECAUTIONS: Update 12/10/21: Wean from brace 1/2 days in brace for 2 weeks then discontinue May start progressive ROM of the lumbar spine with core strengthening exercises, no extreme stretching or flexibility training as healing is progressive over the year following the lumbar fusion. LE strengthening and hamstring stretching, aerobic exercise. No lifting greater than 10 lbs  Update: 01/11/22: per provider note can start lifting up to 25-30 lbs gradually working up to this   SUBJECTIVE: Patient reports her back is feeling good without pain. Was able to do some laundry without issues.    PAIN:  Are you having pain? None currently  At worst NPRS scale: at worst 1/10 Pain location: Right low back Pain description: achy, sore Aggravating factors: laying on the Rt side  Relieving factors:  rest    OBJECTIVE: (objective measures completed at initial evaluation unless otherwise dated) PATIENT SURVEYS:  FOTO 41% (predicted 57%)  11/30/21: 54% 12/13/21: 60%   POSTURE: rounded shoulders and forward head   LUMBAR ROM:  Deferred due to post-op acuity and current ROM restrictions  Active  A/PROM  01/06/22  Flexion  25% limited   Extension  50% limited   Right lateral flexion  25% limited  Left lateral flexion  25% limited   Right rotation  [facility]  Left rotation  WFL   (Blank rows = not tested)   LOWER EXTREMITY MMT:     MMT Right eval Left eval  01/06/22 Right 01/06/22 Left  01/13/22  Hip flexion 3+ 4- 4- 4- 4/5 bilateral  Hip extension         Hip abduction 3- 3- 4- 3+   Hip adduction         Hip internal rotation         Hip external rotation         Knee flexion 5 5 5 5    Knee extension 5 5 5 5    Ankle dorsiflexion 4 4 5 5    Ankle plantarflexion         Ankle inversion         Ankle eversion          (Blank rows = not tested)   FUNCTIONAL TESTS:                        SLS: unable 12/07/21: LLE 3 seconds; RLE 1 second :  12/13/21: 3 sec best bilat  01/06/22: 4 sec LLE; 2 sec RLE  5 X STS: 28.3 seconds with BUE support:  12/13/21 : 25.3 sec without UE 12/30/21: 19.3 seconds   TUG: 35 seconds  11/18/21: 26 seconds with SPC  12/07/21: 23.6 seconds with SPC  12/13/21: 23.0 secounds with SPC    Sit <> stand: requires cues for proper hand placement (continues to put hands on RW) Sit to supine: Min A  Supine to Sit: Mod A  11/22/21: Sit <>supine: Mod I (increased time)   GAIT: - assessed 12/02/2021 Distance walked: - Assistive device utilized: SPC Level of assistance: Modified independence Comments: slow gait speed      TODAY'S TREATMENT  [facility] Adult PT Treatment:                                                DATE: 01/13/22 Therapeutic Exercise: NuStep level 6 x 5 minutes UE/LE Leg press 3 x 10; 20 lbs  Step ups 8 inch 2 x 10 each  Cybex chest press 3 x 10 @ 10 lbs   Therapeutic Activity: Floor transfer demonstration and performed.  Farmers carry 10 lb kettlebell in each hand x 185 ft   [facility] Adult PT Treatment:                                                DATE: 01/10/22 Therapeutic Exercise: NuStep level 6 x 5 minutes UE/LE  Squat to chair 2 x 8; 10 lbs  Mini lunge with single UE support 2 x 10 bilateral  Resisted row 2 x 10 @ 10  lbs   Therapeutic Activity: Farmers carry 5 lb kettlebell in each hand x 185 ft  Sled pushing 2 x 50 ft @ 20 lbs Sled pulling 2 x 50 ft (no weight)   [facility] Adult PT Treatment:                                                 DATE: 01/06/22 Therapeutic Exercise: NuStep level 6 x 5 minutes UE/LE Mini lunge at counter 2 x 10 bilateral  Sidelying hip abduction 2 x 10 bilateral  SLR 2 x 10 bilateral  Updated HEP  Therapeutic Activity: Re-assessment to determine overall progress educating patient on progress towards goals and impairments that will be addressed throughout POC Sled pushing 2 x 50 ft (no weight) Sled pulling 2 x 50 ft (no weight)     PATIENT EDUCATION:  Education details:n/a Person educated: n/a Education method: n/a Education comprehension: n/a   HOME EXERCISE PROGRAM: Access Code: [email or web address] URL: [email or web address] Date: 12/15/2021 Prepared by: Hessie Diener  Exercises - Seated March  - 2 x daily - 7 x weekly - 2 sets - 10 reps - Seated Long Arc Quad  - 2 x daily - 7 x weekly - 2 sets - 10 reps - Heel Raises with Counter Support  - 2 x daily - 7 x weekly - 2 sets - 10 reps - Supine Bridge  - 1 x daily - 7 x weekly - 2 sets - 10 reps - Small Range Straight Leg Raise  - 1 x daily - 7 x weekly - 2 sets - 10 reps - Hooklying Clamshell with Resistance  - 1 x daily - 7 x weekly - 2 sets - 10 reps - Supine Lower Trunk Rotation  - 1 x daily - 7 x weekly - 1 sets - 5 reps - 5-10 hold - Hooklying Single Knee to Chest  - 1 x daily - 7 x weekly - 1 sets - 2-3 reps - 30 hold - Hooklying Hamstring Stretch with Strap  - 1 x daily - 7 x weekly - 1 sets - 2-3 reps - 30 hold - Pelvic tilt  - 1 x daily - 7 x weekly - 2 sets - 10 reps - 5 hold - Supine March with Posterior Pelvic Tilt  - 1 x daily - 7 x weekly - 2 sets - 10 reps    ASSESSMENT: CLINICAL IMPRESSION: Patient tolerated session well today without reports of back pain. She was able to complete proper floor transfer with no signs of instability reporting increased confidence in completing household cleaning tasks that require her to get down on the floor. Continued with functional  strengthening with patient having mild difficulty maintaining balance with step ups bilaterally.     OBJECTIVE IMPAIRMENTS Abnormal gait, decreased activity tolerance, decreased balance, decreased endurance, decreased knowledge of use of DME, decreased mobility, difficulty walking, decreased ROM, decreased strength, improper body mechanics, postural dysfunction, and pain.    ACTIVITY LIMITATIONS carrying, lifting, bending, sitting, standing, squatting, stairs, transfers, bed mobility, bathing, toileting, and locomotion level   PARTICIPATION LIMITATIONS: meal prep, cleaning, laundry, driving, shopping, community activity, and yard work   PERSONAL FACTORS Age and Fitness are also affecting patient's functional outcome.      GOALS: Goals reviewed with patient? Yes   SHORT TERM GOALS: Target date: 12/09/2021   Patient will require  no cues for proper hand placement when performing sit to stand to improve safety with transfers.  Baseline: constant cues to not push from the RW; 11/22/21 unable to recall proper placement; 12/07/21: able to safely transfer with her [facility] and recall hand positioning   Goal status: MET   2.  Patient will be Mod I with supine <>sit transfer.  Baseline:  Goal status: MET   3.  Patient will maintain SLS for at least 2 seconds bilaterally to improve gait stability.  Baseline: unable 12/13/21: able to hold SLS for 3 sec bilaterally  Goal status: MET   4.  Patient will complete TUG in </=25 seconds to signify a reduction in fall risk  Baseline:  Goal status: MET     LONG TERM GOALS: Target date: 01/08/2022   Patient will demonstrate proper bending and lifting mechanics to reduce stress on her back.  Baseline: unable  Status:12/13/21:  Is weaning from back brace  Status: 01/06/22: progressing lifting as appropriate  Goal status: ONGOING   2.  Patient will ambulate community and household distances with LRAD.  Baseline:  Status: 12/13/21: uses SPC at grocery store ,  mostly no AD use in home  Goal status: MET   3.  Patient will complete 5 x STS in </= 20 seconds to improve her functional strength.   Baseline:  Goal status: MET    4.  Patient will demonstrate 4/5 hip flexor strength to improve ability to negotiate stairs and curbs. Baseline: R 3+/5, L 4-/5 Goal status: ONGOING   5.  Patient will demonstrate at least 4-/5 bilateral hip abductor strength to improve stability about the chain with walking activity.  Baseline: 3-/5 bilateral Goal status: partially met    6.  Patient will tolerate at least 30 minutes of standing activity in order to cook for her family.  Baseline: see above  Status: 12/13/21: can tolerate 15-20 minutes  Status: 01/04/22: can tolerate 30 minutes in store and kitchen Goal status: MET             7. Patient will score at least 57% on FOTO to signify clinically meaningful improvement in functional abilities.                       Baseline: 41% Status: 11/30/21: 54%        Status: 12/13/21: 60%                      Goal status: MET   8. Patient will report no limitations with cleaning activities around her home as it relates to her back.    Baseline: has not completed sweeping, mopping, vacuuming, and cleaning bath tub   Goal Status: Initial  PLAN: PT FREQUENCY: 2x/week for 1 week, then 1 x week   PT DURATION: 4 weeks   PLANNED INTERVENTIONS: Therapeutic exercises, Therapeutic activity, Neuromuscular re-education, Balance training, Gait training, Patient/Family education, Self Care, Stair training, DME instructions, Cryotherapy, Moist heat, Manual therapy, and Re-evaluation.   PLAN FOR NEXT SESSION: lifting/bending, pushing/pulling, review floor transfer if needed   Gwendolyn Grant, PT, DPT, ATC 01/13/22 10:13 AM

## 2022-01-13 ENCOUNTER — Ambulatory Visit: Payer: Medicare Other

## 2022-01-13 DIAGNOSIS — M6281 Muscle weakness (generalized): Secondary | ICD-10-CM

## 2022-01-13 DIAGNOSIS — R2689 Other abnormalities of gait and mobility: Secondary | ICD-10-CM

## 2022-01-13 DIAGNOSIS — M5459 Other low back pain: Secondary | ICD-10-CM

## 2022-01-20 ENCOUNTER — Ambulatory Visit (INDEPENDENT_AMBULATORY_CARE_PROVIDER_SITE_OTHER): Payer: Medicare Other | Admitting: Orthopaedic Surgery

## 2022-01-20 ENCOUNTER — Ambulatory Visit (INDEPENDENT_AMBULATORY_CARE_PROVIDER_SITE_OTHER): Payer: Medicare Other

## 2022-01-20 DIAGNOSIS — M25551 Pain in right hip: Secondary | ICD-10-CM

## 2022-01-20 NOTE — Progress Notes (Signed)
The patient is a 66 year old female sent to me from Dr. Louanne Skye to evaluate and treat right hip pain.  She has had lumbar spine surgery in the past.  She states that her low back to the right side hurts.  She denies any groin pain or any lateral hip pain on the right side.  On exam her right hip moves smoothly and fluidly with no pain in the groin.  Her left hip also moves smoothly and fluidly.  I then had her lay in a supine position and rolled to her left side with the right side up.  There was no pain of the trochanteric area or the IT band.  Her pain seems to be in the posterior pelvis and low back in the SI joint region.  I then had her lay supine again and put her left hip and right hip to internal and external rotation and she had no pain in the groin and good range of motion with no stiffness or blocks to rotation.  An AP pelvis and lateral of the right hip does show mild to moderate arthritic changes with superior lateral joint space narrowing compared to the left side.  The acetabular rim superior lateral does overhang slightly with an osteophyte.  As of now she has not had any type of right hip pain to warrant any other intervention including even a steroid injection.  However if she does start developing right hip and groin pain she knows to come back and see [place].  Follow-up for her right hip thus far is as needed.

## 2022-01-20 NOTE — Therapy (Signed)
OUTPATIENT PHYSICAL THERAPY TREATMENT NOTE         Patient Name: Carol Wilkins MRN: [id number] DOB:04/07/1956, 66 y.o., female Today's Date: 01/21/2022  PCP: Roselee Nova REFERRING PROVIDER: Jessy Oto, MD  END OF SESSION:   PT End of Session - 01/21/22 0930     Visit Number 20    Number of Visits 22    Date for PT Re-Evaluation 02/05/22    Authorization Type UHC MCR    Progress Note Due on Visit 68    PT Start Time 0930    PT Stop Time 1012    PT Time Calculation (min) 42 min    Activity Tolerance Patient tolerated treatment well    Behavior During Therapy WFL for tasks assessed/performed                      Past Medical History:  Diagnosis Date   Anxiety attack    Arthritis    Diabetes mellitus without complication ([place])    type 2 on metformin   High cholesterol    Hypertension    Past Surgical History:  Procedure Laterality Date   ABDOMINAL HYSTERECTOMY     in early 56's   CHOLECYSTECTOMY     in the 90's   TEE WITHOUT CARDIOVERSION N/A 07/14/2015   Procedure: TRANSESOPHAGEAL ECHOCARDIOGRAM (TEE);  Surgeon: Adrian Prows, MD;  Location: [facility] ENDOSCOPY;  Service: Cardiovascular;  Laterality: N/A;   [place]   Patient Active Problem List   Diagnosis Date Noted   Spinal stenosis, lumbar region with neurogenic claudication    Spondylolisthesis, lumbar region    Fusion of spine of lumbar region 08/31/2021   Colon cancer screening 09/09/2020   Constipation 09/09/2020   Flatulence, eructation and gas pain 09/09/2020   Irritable bowel syndrome 09/09/2020   Personal history of colonic polyps 09/09/2020   Rectal bleeding 09/09/2020   Pes planus 09/09/2020   Posterior tibial tendon dysfunction (PTTD) of both lower extremities 09/09/2020   Hav (hallux abducto valgus), unspecified laterality 09/09/2020   Capsulitis 09/09/2020   Low back pain 01/25/2012   Abdominal pain 11/02/2011   Obesity (BMI 30.0-34.9)  10/31/2011   Post herpetic neuralgia 09/14/2011   DEPRESSION 12/22/2006   HYPERTENSION 12/22/2006   ALLERGIC RHINITIS 12/22/2006   PALPITATIONS, HX OF 12/22/2006    REFERRING DIAG: Z98.1 (ICD-10-CM) - S/P lumbar fusion M43.16 (ICD-10-CM) - Spondylolisthesis at L3-L4 level M54.16 (ICD-10-CM) - Radiculopathy, lumbar region   THERAPY DIAG:  Muscle weakness (generalized)  Other abnormalities of gait and mobility  Other low back pain  Rationale for Evaluation and Treatment Rehabilitation  PERTINENT HISTORY: s/p L3-4 and L4-5 TLIF 08/31/21  PRECAUTIONS: Update 12/10/21: Wean from brace 1/2 days in brace for 2 weeks then discontinue May start progressive ROM of the lumbar spine with core strengthening exercises, no extreme stretching or flexibility training as healing is progressive over the year following the lumbar fusion. LE strengthening and hamstring stretching, aerobic exercise. No lifting greater than 10 lbs  Update: 01/11/22: per provider note can start lifting up to 25-30 lbs gradually working up to this   SUBJECTIVE: Patient reports she is feeling good without pain. She was able to wash her tub without pain, but it was pretty difficult to do.    PAIN:  Are you having pain? No   OBJECTIVE: (objective measures completed at initial evaluation unless otherwise dated) PATIENT SURVEYS:  FOTO 41% (predicted 57%)  11/30/21: 54% 12/13/21: 60%   POSTURE: rounded shoulders and forward head   LUMBAR ROM:  Deferred due to post-op acuity and current ROM restrictions  Active  A/PROM  01/06/22  Flexion  25% limited   Extension  50% limited   Right lateral flexion  25% limited  Left lateral flexion  25% limited   Right rotation  [facility]  Left rotation  WFL   (Blank rows = not tested)   LOWER EXTREMITY MMT:     MMT Right eval Left eval 01/06/22 Right 01/06/22 Left  01/13/22  Hip flexion 3+ 4- 4- 4- 4/5 bilateral  Hip extension         Hip abduction 3- 3- 4- 3+   Hip adduction          Hip internal rotation         Hip external rotation         Knee flexion 5 5 5 5    Knee extension 5 5 5 5    Ankle dorsiflexion 4 4 5 5    Ankle plantarflexion         Ankle inversion         Ankle eversion          (Blank rows = not tested)   FUNCTIONAL TESTS:                        SLS: unable 12/07/21: LLE 3 seconds; RLE 1 second :  12/13/21: 3 sec best bilat  01/06/22: 4 sec LLE; 2 sec RLE  5 X STS: 28.3 seconds with BUE support:  12/13/21 : 25.3 sec without UE 12/30/21: 19.3 seconds   TUG: 35 seconds  11/18/21: 26 seconds with SPC  12/07/21: 23.6 seconds with SPC  12/13/21: 23.0 secounds with SPC    Sit <> stand: requires cues for proper hand placement (continues to put hands on RW) Sit to supine: Min A  Supine to Sit: Mod A  11/22/21: Sit <>supine: Mod I (increased time)   GAIT: - assessed 12/02/2021 Distance walked: - Assistive device utilized: SPC Level of assistance: Modified independence Comments: slow gait speed      TODAY'S TREATMENT  [facility] Adult PT Treatment:                                                DATE: 01/20/22 Therapeutic Exercise: NuStep level 6 x 5 minutes  Squats 2  x 10; 10 lb kettle bell  Bent over row 2 x 10; 5 lbs  Dead lift with 5 lb kettle bell 2 x 10  Cybex chest press 3 x 10; 1st set at 10 lbs, 2-3 sets at 15 lbs   Therapeutic Activity: Step ups with 5 lb dumbbells in each hand 6 inch step 2 x 10  Farmers carry 10 lb kettlebell in each hand x 185 ft     [facility] Adult PT Treatment:                                                DATE: 01/13/22 Therapeutic Exercise: NuStep level 6 x 5 minutes UE/LE Leg press 3 x 10; 20 lbs  Step ups 8 inch 2 x 10 each  Cybex  chest press 3 x 10 @ 10 lbs   Therapeutic Activity: Floor transfer demonstration and performed.  Farmers carry 10 lb kettlebell in each hand x 185 ft   [facility] Adult PT Treatment:                                                DATE: 01/10/22 Therapeutic Exercise: NuStep level 6 x 5 minutes  UE/LE  Squat to chair 2 x 8; 10 lbs  Mini lunge with single UE support 2 x 10 bilateral  Resisted row 2 x 10 @ 10 lbs   Therapeutic Activity: Farmers carry 5 lb kettlebell in each hand x 185 ft  Sled pushing 2 x 50 ft @ 20 lbs Sled pulling 2 x 50 ft (no weight)       PATIENT EDUCATION:  Education details:n/a Person educated: n/a Education method: n/a Education comprehension: n/a   HOME EXERCISE PROGRAM: Access Code: [email or web address] URL: [email or web address] Date: 12/15/2021 Prepared by: Hessie Diener  Exercises - Seated March  - 2 x daily - 7 x weekly - 2 sets - 10 reps - Seated Long Arc Quad  - 2 x daily - 7 x weekly - 2 sets - 10 reps - Heel Raises with Counter Support  - 2 x daily - 7 x weekly - 2 sets - 10 reps - Supine Bridge  - 1 x daily - 7 x weekly - 2 sets - 10 reps - Small Range Straight Leg Raise  - 1 x daily - 7 x weekly - 2 sets - 10 reps - Hooklying Clamshell with Resistance  - 1 x daily - 7 x weekly - 2 sets - 10 reps - Supine Lower Trunk Rotation  - 1 x daily - 7 x weekly - 1 sets - 5 reps - 5-10 hold - Hooklying Single Knee to Chest  - 1 x daily - 7 x weekly - 1 sets - 2-3 reps - 30 hold - Hooklying Hamstring Stretch with Strap  - 1 x daily - 7 x weekly - 1 sets - 2-3 reps - 30 hold - Pelvic tilt  - 1 x daily - 7 x weekly - 2 sets - 10 reps - 5 hold - Supine March with Posterior Pelvic Tilt  - 1 x daily - 7 x weekly - 2 sets - 10 reps    ASSESSMENT: CLINICAL IMPRESSION: Patient tolerated session well today with focus on progression of function strengthening. She is challenged with step ups while holding dumbbells with patient showing occasional signs of instability with stepping up on the RLE compared to the LLE. She required initial cues for proper form with dead lift with ability to correct. No reports of back pain throughout session.     OBJECTIVE IMPAIRMENTS Abnormal gait, decreased activity tolerance, decreased balance, decreased endurance,  decreased knowledge of use of DME, decreased mobility, difficulty walking, decreased ROM, decreased strength, improper body mechanics, postural dysfunction, and pain.    ACTIVITY LIMITATIONS carrying, lifting, bending, sitting, standing, squatting, stairs, transfers, bed mobility, bathing, toileting, and locomotion level   PARTICIPATION LIMITATIONS: meal prep, cleaning, laundry, driving, shopping, community activity, and yard work   PERSONAL FACTORS Age and Fitness are also affecting patient's functional outcome.      GOALS: Goals reviewed with patient? Yes   SHORT TERM GOALS: Target date: 12/09/2021   Patient will require  no cues for proper hand placement when performing sit to stand to improve safety with transfers.  Baseline: constant cues to not push from the RW; 11/22/21 unable to recall proper placement; 12/07/21: able to safely transfer with her [facility] and recall hand positioning   Goal status: MET   2.  Patient will be Mod I with supine <>sit transfer.  Baseline:  Goal status: MET   3.  Patient will maintain SLS for at least 2 seconds bilaterally to improve gait stability.  Baseline: unable 12/13/21: able to hold SLS for 3 sec bilaterally  Goal status: MET   4.  Patient will complete TUG in </=25 seconds to signify a reduction in fall risk  Baseline:  Goal status: MET     LONG TERM GOALS: Target date: 01/08/2022   Patient will demonstrate proper bending and lifting mechanics to reduce stress on her back.  Baseline: unable  Status:12/13/21:  Is weaning from back brace  Status: 01/06/22: progressing lifting as appropriate  Goal status: ONGOING   2.  Patient will ambulate community and household distances with LRAD.  Baseline:  Status: 12/13/21: uses SPC at grocery store , mostly no AD use in home  Goal status: MET   3.  Patient will complete 5 x STS in </= 20 seconds to improve her functional strength.   Baseline:  Goal status: MET    4.  Patient will demonstrate 4/5 hip  flexor strength to improve ability to negotiate stairs and curbs. Baseline: R 3+/5, L 4-/5 Goal status: ONGOING   5.  Patient will demonstrate at least 4-/5 bilateral hip abductor strength to improve stability about the chain with walking activity.  Baseline: 3-/5 bilateral Goal status: partially met    6.  Patient will tolerate at least 30 minutes of standing activity in order to cook for her family.  Baseline: see above  Status: 12/13/21: can tolerate 15-20 minutes  Status: 01/04/22: can tolerate 30 minutes in store and kitchen Goal status: MET             7. Patient will score at least 57% on FOTO to signify clinically meaningful improvement in functional abilities.                       Baseline: 41% Status: 11/30/21: 54%        Status: 12/13/21: 60%                      Goal status: MET   8. Patient will report no limitations with cleaning activities around her home as it relates to her back.    Baseline: has not completed sweeping, mopping, vacuuming, and cleaning bath tub   Goal Status: Initial  PLAN: PT FREQUENCY: 2x/week for 1 week, then 1 x week   PT DURATION: 4 weeks   PLANNED INTERVENTIONS: Therapeutic exercises, Therapeutic activity, Neuromuscular re-education, Balance training, Gait training, Patient/Family education, Self Care, Stair training, DME instructions, Cryotherapy, Moist heat, Manual therapy, and Re-evaluation.   PLAN FOR NEXT SESSION: lifting/bending, pushing/pulling, review floor transfer if needed   Gwendolyn Grant, PT, DPT, ATC 01/21/22 10:12 AM

## 2022-01-21 ENCOUNTER — Ambulatory Visit: Payer: Medicare Other | Attending: Specialist

## 2022-01-21 DIAGNOSIS — R2689 Other abnormalities of gait and mobility: Secondary | ICD-10-CM | POA: Diagnosis present

## 2022-01-21 DIAGNOSIS — M6281 Muscle weakness (generalized): Secondary | ICD-10-CM | POA: Diagnosis present

## 2022-01-21 DIAGNOSIS — M5459 Other low back pain: Secondary | ICD-10-CM

## 2022-01-28 ENCOUNTER — Ambulatory Visit: Payer: Medicare Other | Admitting: Physical Therapy

## 2022-01-28 ENCOUNTER — Encounter: Payer: Self-pay | Admitting: Physical Therapy

## 2022-01-28 DIAGNOSIS — M5459 Other low back pain: Secondary | ICD-10-CM

## 2022-01-28 DIAGNOSIS — M6281 Muscle weakness (generalized): Secondary | ICD-10-CM | POA: Diagnosis not present

## 2022-01-28 DIAGNOSIS — R2689 Other abnormalities of gait and mobility: Secondary | ICD-10-CM

## 2022-01-28 NOTE — Therapy (Signed)
OUTPATIENT PHYSICAL THERAPY TREATMENT NOTE         Patient Name: Carol Wilkins MRN: [id number] DOB:[birth year], 66 y.o., female Today's Date: 01/28/2022  PCP: Roselee Nova REFERRING PROVIDER: Jessy Oto, MD  END OF SESSION:   PT End of Session - 01/28/22 0932     Visit Number 21    Number of Visits 22    Date for PT Re-Evaluation 02/05/22    Authorization Type UHC MCR    Progress Note Due on Visit 19    PT Start Time 0932    PT Stop Time 1015    PT Time Calculation (min) 43 min                      Past Medical History:  Diagnosis Date   Anxiety attack    Arthritis    Diabetes mellitus without complication ([place])    type 2 on metformin   High cholesterol    Hypertension    Past Surgical History:  Procedure Laterality Date   ABDOMINAL HYSTERECTOMY     in early 75's   CHOLECYSTECTOMY     in the 90's   TEE WITHOUT CARDIOVERSION N/A 07/14/2015   Procedure: TRANSESOPHAGEAL ECHOCARDIOGRAM (TEE);  Surgeon: Adrian Prows, MD;  Location: [facility] ENDOSCOPY;  Service: Cardiovascular;  Laterality: N/A;   [place]   Patient Active Problem List   Diagnosis Date Noted   Spinal stenosis, lumbar region with neurogenic claudication    Spondylolisthesis, lumbar region    Fusion of spine of lumbar region 08/31/2021   Colon cancer screening 09/09/2020   Constipation 09/09/2020   Flatulence, eructation and gas pain 09/09/2020   Irritable bowel syndrome 09/09/2020   Personal history of colonic polyps 09/09/2020   Rectal bleeding 09/09/2020   Pes planus 09/09/2020   Posterior tibial tendon dysfunction (PTTD) of both lower extremities 09/09/2020   Hav (hallux abducto valgus), unspecified laterality 09/09/2020   Capsulitis 09/09/2020   Low back pain 01/25/2012   Abdominal pain 11/02/2011   Obesity (BMI 30.0-34.9) 10/31/2011   Post herpetic neuralgia 09/14/2011   DEPRESSION 12/22/2006   HYPERTENSION 12/22/2006   ALLERGIC  RHINITIS 12/22/2006   PALPITATIONS, HX OF 12/22/2006    REFERRING DIAG: Z98.1 (ICD-10-CM) - S/P lumbar fusion M43.16 (ICD-10-CM) - Spondylolisthesis at L3-L4 level M54.16 (ICD-10-CM) - Radiculopathy, lumbar region   THERAPY DIAG:  Muscle weakness (generalized)  Other abnormalities of gait and mobility  Other low back pain  Rationale for Evaluation and Treatment Rehabilitation  PERTINENT HISTORY: s/p L3-4 and L4-5 TLIF 08/31/21  PRECAUTIONS: Update 12/10/21: Wean from brace 1/2 days in brace for 2 weeks then discontinue May start progressive ROM of the lumbar spine with core strengthening exercises, no extreme stretching or flexibility training as healing is progressive over the year following the lumbar fusion. LE strengthening and hamstring stretching, aerobic exercise. No lifting greater than 10 lbs  Update: 01/11/22: per provider note can start lifting up to 25-30 lbs gradually working up to this   SUBJECTIVE: Patient reports she is feeling good without pain. She was able to vacuum her living room without much difficulty. She is using activity pacing strategies to no over do it.    PAIN:  Are you having pain? No   OBJECTIVE: (objective measures completed at initial evaluation unless otherwise dated) PATIENT SURVEYS:  FOTO 41% (predicted 57%)  11/30/21: 54% 12/13/21: 60%   POSTURE: rounded shoulders and forward head  LUMBAR ROM:  Deferred due to post-op acuity and current ROM restrictions  Active  A/PROM  01/06/22  Flexion  25% limited   Extension  50% limited   Right lateral flexion  25% limited  Left lateral flexion  25% limited   Right rotation  [facility]  Left rotation  WFL   (Blank rows = not tested)   LOWER EXTREMITY MMT:     MMT Right eval Left eval 01/06/22 Right 01/06/22 Left  01/13/22  Hip flexion 3+ 4- 4- 4- 4/5 bilateral  Hip extension         Hip abduction 3- 3- 4- 3+   Hip adduction         Hip internal rotation         Hip external rotation         Knee  flexion 5 5 5 5    Knee extension 5 5 5 5    Ankle dorsiflexion 4 4 5 5    Ankle plantarflexion         Ankle inversion         Ankle eversion          (Blank rows = not tested)   FUNCTIONAL TESTS:                        SLS: unable 12/07/21: LLE 3 seconds; RLE 1 second :  12/13/21: 3 sec best bilat  01/06/22: 4 sec LLE; 2 sec RLE  5 X STS: 28.3 seconds with BUE support:  12/13/21 : 25.3 sec without UE 12/30/21: 19.3 seconds   TUG: 35 seconds  11/18/21: 26 seconds with SPC  12/07/21: 23.6 seconds with SPC  12/13/21: 23.0 secounds with SPC    Sit <> stand: requires cues for proper hand placement (continues to put hands on RW) Sit to supine: Min A  Supine to Sit: Mod A  11/22/21: Sit <>supine: Mod I (increased time)   GAIT: - assessed 12/02/2021 Distance walked: - Assistive device utilized: SPC Level of assistance: Modified independence Comments: slow gait speed      TODAY'S TREATMENT  [facility] Adult PT Treatment:                                                DATE: 01/28/22 Therapeutic Exercise: NuStep level 6 x 5 minutes  Cybex chest press,3 sets at 15 lbs  STS 2  x 10; 10 lb kettle bell  Bent over row 2 x 10; 5 lbs in each hand   Therapeutic Activity: Step ups with 5 lb dumbbells in each hand 8 inch step 2 x 10  Farmers carry 10 lb kettlebell in each hand x 185 ft    TODAY'S TREATMENT  [facility] Adult PT Treatment:                                                DATE: 01/20/22 Therapeutic Exercise: NuStep level 6 x 5 minutes  Squats 2  x 10; 10 lb kettle bell  Bent over row 2 x 10; 5 lbs  Dead lift with 5 lb kettle bell 2 x 10  Cybex chest press 3 x 10; 1st set at 10 lbs, 2-3 sets at 15 lbs  Therapeutic Activity: Step ups with 5 lb dumbbells in each hand 6 inch step 2 x 10  Farmers carry 10 lb kettlebell in each hand x 185 ft     [facility] Adult PT Treatment:                                                DATE: 01/13/22 Therapeutic Exercise: NuStep level 6 x 5 minutes UE/LE Leg  press 3 x 10; 20 lbs  Step ups 8 inch 2 x 10 each  Cybex chest press 3 x 10 @ 10 lbs   Therapeutic Activity: Floor transfer demonstration and performed.  Farmers carry 10 lb kettlebell in each hand x 185 ft   [facility] Adult PT Treatment:                                                DATE: 01/10/22 Therapeutic Exercise: NuStep level 6 x 5 minutes UE/LE  Squat to chair 2 x 8; 10 lbs  Mini lunge with single UE support 2 x 10 bilateral  Resisted row 2 x 10 @ 10 lbs   Therapeutic Activity: Farmers carry 5 lb kettlebell in each hand x 185 ft  Sled pushing 2 x 50 ft @ 20 lbs Sled pulling 2 x 50 ft (no weight)       PATIENT EDUCATION:  Education details:n/a Person educated: n/a Education method: n/a Education comprehension: n/a   HOME EXERCISE PROGRAM: Access Code: [email or web address] URL: [email or web address] Date: 12/15/2021 Prepared by: Hessie Diener  Exercises - Seated March  - 2 x daily - 7 x weekly - 2 sets - 10 reps - Seated Long Arc Quad  - 2 x daily - 7 x weekly - 2 sets - 10 reps - Heel Raises with Counter Support  - 2 x daily - 7 x weekly - 2 sets - 10 reps - Supine Bridge  - 1 x daily - 7 x weekly - 2 sets - 10 reps - Small Range Straight Leg Raise  - 1 x daily - 7 x weekly - 2 sets - 10 reps - Hooklying Clamshell with Resistance  - 1 x daily - 7 x weekly - 2 sets - 10 reps - Supine Lower Trunk Rotation  - 1 x daily - 7 x weekly - 1 sets - 5 reps - 5-10 hold - Hooklying Single Knee to Chest  - 1 x daily - 7 x weekly - 1 sets - 2-3 reps - 30 hold - Hooklying Hamstring Stretch with Strap  - 1 x daily - 7 x weekly - 1 sets - 2-3 reps - 30 hold - Pelvic tilt  - 1 x daily - 7 x weekly - 2 sets - 10 reps - 5 hold - Supine March with Posterior Pelvic Tilt  - 1 x daily - 7 x weekly - 2 sets - 10 reps    ASSESSMENT: CLINICAL IMPRESSION: Patient tolerated session well today with focus on progression of function strengthening. She is challenged with step ups while  holding dumbbells with patient showing occasional signs of instability with stepping up on the LLE compared to the RLE today. She reports improved tolerance to vacuuming when using activity pacing strategies at home.  No reports of back pain throughout session.     OBJECTIVE IMPAIRMENTS Abnormal gait, decreased activity tolerance, decreased balance, decreased endurance, decreased knowledge of use of DME, decreased mobility, difficulty walking, decreased ROM, decreased strength, improper body mechanics, postural dysfunction, and pain.    ACTIVITY LIMITATIONS carrying, lifting, bending, sitting, standing, squatting, stairs, transfers, bed mobility, bathing, toileting, and locomotion level   PARTICIPATION LIMITATIONS: meal prep, cleaning, laundry, driving, shopping, community activity, and yard work   PERSONAL FACTORS Age and Fitness are also affecting patient's functional outcome.      GOALS: Goals reviewed with patient? Yes   SHORT TERM GOALS: Target date: 12/09/2021   Patient will require no cues for proper hand placement when performing sit to stand to improve safety with transfers.  Baseline: constant cues to not push from the RW; 11/22/21 unable to recall proper placement; 12/07/21: able to safely transfer with her [facility] and recall hand positioning   Goal status: MET   2.  Patient will be Mod I with supine <>sit transfer.  Baseline:  Goal status: MET   3.  Patient will maintain SLS for at least 2 seconds bilaterally to improve gait stability.  Baseline: unable 12/13/21: able to hold SLS for 3 sec bilaterally  Goal status: MET   4.  Patient will complete TUG in </=25 seconds to signify a reduction in fall risk  Baseline:  Goal status: MET     LONG TERM GOALS: Target date: 01/08/2022   Patient will demonstrate proper bending and lifting mechanics to reduce stress on her back.  Baseline: unable  Status:12/13/21:  Is weaning from back brace  Status: 01/06/22: progressing lifting as  appropriate  Goal status: ONGOING   2.  Patient will ambulate community and household distances with LRAD.  Baseline:  Status: 12/13/21: uses SPC at grocery store , mostly no AD use in home  Goal status: MET   3.  Patient will complete 5 x STS in </= 20 seconds to improve her functional strength.   Baseline:  Goal status: MET    4.  Patient will demonstrate 4/5 hip flexor strength to improve ability to negotiate stairs and curbs. Baseline: R 3+/5, L 4-/5 Goal status: ONGOING   5.  Patient will demonstrate at least 4-/5 bilateral hip abductor strength to improve stability about the chain with walking activity.  Baseline: 3-/5 bilateral Goal status: partially met    6.  Patient will tolerate at least 30 minutes of standing activity in order to cook for her family.  Baseline: see above  Status: 12/13/21: can tolerate 15-20 minutes  Status: 01/04/22: can tolerate 30 minutes in store and kitchen Goal status: MET             7. Patient will score at least 57% on FOTO to signify clinically meaningful improvement in functional abilities.                       Baseline: 41% Status: 11/30/21: 54%        Status: 12/13/21: 60%                      Goal status: MET   8. Patient will report no limitations with cleaning activities around her home as it relates to her back.    Baseline: has not completed sweeping, mopping, vacuuming, and cleaning bath tub   Goal Status: Initial  PLAN: PT FREQUENCY: 2x/week for 1 week, then 1 x week   PT  DURATION: 4 weeks   PLANNED INTERVENTIONS: Therapeutic exercises, Therapeutic activity, Neuromuscular re-education, Balance training, Gait training, Patient/Family education, Self Care, Stair training, DME instructions, Cryotherapy, Moist heat, Manual therapy, and Re-evaluation.   PLAN FOR NEXT SESSION: lifting/bending, pushing/pulling, review floor transfer if needed , FOTO? MMT  Hessie Diener, PTA 01/28/22 10:00 AM Phone: [phone] Fax: [phone]

## 2022-02-04 ENCOUNTER — Ambulatory Visit: Payer: Medicare Other

## 2022-02-04 DIAGNOSIS — M6281 Muscle weakness (generalized): Secondary | ICD-10-CM

## 2022-02-04 DIAGNOSIS — R2689 Other abnormalities of gait and mobility: Secondary | ICD-10-CM

## 2022-02-04 DIAGNOSIS — M5459 Other low back pain: Secondary | ICD-10-CM

## 2022-02-04 NOTE — Therapy (Signed)
OUTPATIENT PHYSICAL THERAPY TREATMENT NOTE  PHYSICAL THERAPY DISCHARGE SUMMARY  Visits from Start of Care: 22  Current functional level related to goals / functional outcomes: All goals met    Remaining deficits: N/a   Education / Equipment: See education below    Patient agrees to discharge. Patient goals were met. Patient is being discharged due to meeting the stated rehab goals.          Patient Name: Carol Wilkins MRN: [id number] DOB:[birth year], 66 y.o., female Today's Date: 02/04/2022  PCP: Roselee Nova REFERRING PROVIDER: Jessy Oto, MD  END OF SESSION:   PT End of Session - 02/04/22 0932     Visit Number 22    Number of Visits 22    Date for PT Re-Evaluation 02/05/22    Authorization Type UHC MCR    Progress Note Due on Visit 32    PT Start Time 0932    PT Stop Time 0959    PT Time Calculation (min) 27 min    Activity Tolerance Patient tolerated treatment well    Behavior During Therapy WFL for tasks assessed/performed                      Past Medical History:  Diagnosis Date   Anxiety attack    Arthritis    Diabetes mellitus without complication ([place])    type 2 on metformin   High cholesterol    Hypertension    Past Surgical History:  Procedure Laterality Date   ABDOMINAL HYSTERECTOMY     in early 34's   CHOLECYSTECTOMY     in the 90's   TEE WITHOUT CARDIOVERSION N/A 07/14/2015   Procedure: TRANSESOPHAGEAL ECHOCARDIOGRAM (TEE);  Surgeon: Adrian Prows, MD;  Location: [facility] ENDOSCOPY;  Service: Cardiovascular;  Laterality: N/A;   [place]   Patient Active Problem List   Diagnosis Date Noted   Spinal stenosis, lumbar region with neurogenic claudication    Spondylolisthesis, lumbar region    Fusion of spine of lumbar region 08/31/2021   Colon cancer screening 09/09/2020   Constipation 09/09/2020   Flatulence, eructation and gas pain 09/09/2020   Irritable bowel syndrome 09/09/2020    Personal history of colonic polyps 09/09/2020   Rectal bleeding 09/09/2020   Pes planus 09/09/2020   Posterior tibial tendon dysfunction (PTTD) of both lower extremities 09/09/2020   Hav (hallux abducto valgus), unspecified laterality 09/09/2020   Capsulitis 09/09/2020   Low back pain 01/25/2012   Abdominal pain 11/02/2011   Obesity (BMI 30.0-34.9) 10/31/2011   Post herpetic neuralgia 09/14/2011   DEPRESSION 12/22/2006   HYPERTENSION 12/22/2006   ALLERGIC RHINITIS 12/22/2006   PALPITATIONS, HX OF 12/22/2006    REFERRING DIAG: Z98.1 (ICD-10-CM) - S/P lumbar fusion M43.16 (ICD-10-CM) - Spondylolisthesis at L3-L4 level M54.16 (ICD-10-CM) - Radiculopathy, lumbar region   THERAPY DIAG:  Muscle weakness (generalized)  Other abnormalities of gait and mobility  Other low back pain  Rationale for Evaluation and Treatment Rehabilitation  PERTINENT HISTORY: s/p L3-4 and L4-5 TLIF 08/31/21  PRECAUTIONS: Update 12/10/21: Wean from brace 1/2 days in brace for 2 weeks then discontinue May start progressive ROM of the lumbar spine with core strengthening exercises, no extreme stretching or flexibility training as healing is progressive over the year following the lumbar fusion. LE strengthening and hamstring stretching, aerobic exercise. No lifting greater than 10 lbs  Update: 01/11/22: per provider note can start lifting up to 25-30 lbs  gradually working up to this   SUBJECTIVE: "I have been feeling fine." Patient reports she has been able to complete cooking and cleaning without limitations. She feels that she is ready for d/c.    PAIN:  Are you having pain? No   OBJECTIVE: (objective measures completed at initial evaluation unless otherwise dated) PATIENT SURVEYS:  FOTO 41% (predicted 57%)  11/30/21: 54% 12/13/21: 60% 02/04/22: 79%   POSTURE: rounded shoulders and forward head   LUMBAR ROM:  Deferred due to post-op acuity and current ROM restrictions  Active  A/PROM  01/06/22   Flexion  25% limited   Extension  50% limited   Right lateral flexion  25% limited  Left lateral flexion  25% limited   Right rotation  [facility]  Left rotation  WFL   (Blank rows = not tested)   LOWER EXTREMITY MMT:     MMT Right eval Left eval 01/06/22 Right 01/06/22 Left  01/13/22 02/04/22  Hip flexion 3+ 4- 4- 4- 4/5 bilateral 4+/5 bilateral   Hip extension          Hip abduction 3- 3- 4- 3+  Lt: 4/5; Rt: 4+/5   Hip adduction          Hip internal rotation          Hip external rotation          Knee flexion 5 5 5 5     Knee extension 5 5 5 5     Ankle dorsiflexion 4 4 5 5     Ankle plantarflexion          Ankle inversion          Ankle eversion           (Blank rows = not tested)   FUNCTIONAL TESTS:                        SLS: unable 12/07/21: LLE 3 seconds; RLE 1 second :  12/13/21: 3 sec best bilat  01/06/22: 4 sec LLE; 2 sec RLE  5 X STS: 28.3 seconds with BUE support:  12/13/21 : 25.3 sec without UE 12/30/21: 19.3 seconds   TUG: 35 seconds  11/18/21: 26 seconds with SPC  12/07/21: 23.6 seconds with SPC  12/13/21: 23.0 secounds with SPC    Sit <> stand: requires cues for proper hand placement (continues to put hands on RW) Sit to supine: Min A  Supine to Sit: Mod A  11/22/21: Sit <>supine: Mod I (increased time)   GAIT: - assessed 12/02/2021 Distance walked: - Assistive device utilized: SPC Level of assistance: Modified independence Comments: slow gait speed      TODAY'S TREATMENT  [facility] Adult PT Treatment:                                                DATE: 02/04/22 Therapeutic Exercise: Reviewed and updated advanced HEP demonstrating and returning demo as needed with exercises. Discussed frequency, sets, reps, resistance.   Therapeutic Activity: Re-assessment of strength and FOTO educating patient on progress towards goals.    [facility] Adult PT Treatment:  DATE: 01/28/22 Therapeutic Exercise: NuStep level 6 x 5 minutes   Cybex chest press,3 sets at 15 lbs  STS 2  x 10; 10 lb kettle bell  Bent over row 2 x 10; 5 lbs in each hand   Therapeutic Activity: Step ups with 5 lb dumbbells in each hand 8 inch step 2 x 10  Farmers carry 10 lb kettlebell in each hand x 185 ft    TODAY'S TREATMENT  [facility] Adult PT Treatment:                                                DATE: 01/20/22 Therapeutic Exercise: NuStep level 6 x 5 minutes  Squats 2  x 10; 10 lb kettle bell  Bent over row 2 x 10; 5 lbs  Dead lift with 5 lb kettle bell 2 x 10  Cybex chest press 3 x 10; 1st set at 10 lbs, 2-3 sets at 15 lbs   Therapeutic Activity: Step ups with 5 lb dumbbells in each hand 6 inch step 2 x 10  Farmers carry 10 lb kettlebell in each hand x 185 ft      PATIENT EDUCATION:  Education details:see treatment; d/c education Person educated: patient Education method: Systems developer, handout, cues Education comprehension: returned demo   HOME EXERCISE PROGRAM: Access Code: [email or web address] URL: [email or web address] Date: 02/04/2022 Prepared by: Gwendolyn Grant  Exercises - Supine Lower Trunk Rotation  - 1 x daily - 7 x weekly - 1 sets - 10 reps - 5-10 hold - Standing Lumbar Extension  - 1 x daily - 7 x weekly - 1 sets - 5 reps - 5 sec hold - Hooklying Single Knee to Chest  - 1 x daily - 7 x weekly - 1 sets - 10 reps - 10 sec hold - Hooklying Hamstring Stretch with Strap  - 1 x daily - 7 x weekly - 1 sets - 2-3 reps - 30 hold - Pelvic tilt  - 1 x daily - 7 x weekly - 2 sets - 10 reps - 5 hold - Supine March with Posterior Pelvic Tilt  - 1 x daily - 3 x weekly - 2 sets - 10 reps - Heel Raises with Counter Support  - 1 x daily - 3 x weekly - 2 sets - 10 reps - Sit to Stand  - 1 x daily - 3 x weekly - 2 sets - 10 reps - Hip Extension with Resistance Loop  - 1 x daily - 3 x weekly - 2 sets - 10 reps - Hip Abduction with Resistance Loop  - 1 x daily - 3 x weekly - 2 sets - 10 reps - Standing Marching  - 1 x daily - 3 x weekly - 2  sets - 10 reps - Step Up  - 1 x daily - 3 x weekly - 2 sets - 10 reps - Standing Shoulder Row with Anchored Resistance  - 1 x daily - 3 x weekly - 2 sets - 10 reps    ASSESSMENT: CLINICAL IMPRESSION: [place] has made excellent functional progress s/p L3-4 and L4-5 TLIF on 08/31/21. She has met all established functional goals and has no complaints of back pain. She demonstrates improvements in strength, balance, transfers, gait speed, and has excellent form with lifting moderate loads. She demonstrates independence with advanced home program and is appropriate for  discharge at this time with patient in agreement with this plan.     OBJECTIVE IMPAIRMENTS Abnormal gait, decreased activity tolerance, decreased balance, decreased endurance, decreased knowledge of use of DME, decreased mobility, difficulty walking, decreased ROM, decreased strength, improper body mechanics, postural dysfunction, and pain.    ACTIVITY LIMITATIONS carrying, lifting, bending, sitting, standing, squatting, stairs, transfers, bed mobility, bathing, toileting, and locomotion level   PARTICIPATION LIMITATIONS: meal prep, cleaning, laundry, driving, shopping, community activity, and yard work   PERSONAL FACTORS Age and Fitness are also affecting patient's functional outcome.      GOALS: Goals reviewed with patient? Yes   SHORT TERM GOALS: Target date: 12/09/2021   Patient will require no cues for proper hand placement when performing sit to stand to improve safety with transfers.  Baseline: constant cues to not push from the RW; 11/22/21 unable to recall proper placement; 12/07/21: able to safely transfer with her [facility] and recall hand positioning   Goal status: MET   2.  Patient will be Mod I with supine <>sit transfer.  Baseline:  Goal status: MET   3.  Patient will maintain SLS for at least 2 seconds bilaterally to improve gait stability.  Baseline: unable 12/13/21: able to hold SLS for 3 sec bilaterally  Goal status:  MET   4.  Patient will complete TUG in </=25 seconds to signify a reduction in fall risk  Baseline:  Goal status: MET     LONG TERM GOALS: Target date: 01/08/2022   Patient will demonstrate proper bending and lifting mechanics to reduce stress on her back.  Baseline: unable  Status:12/13/21:  Is weaning from back brace  Status: 01/06/22: progressing lifting as appropriate  02/04/22: has demonstrated proper lifting at moderate loads at recent PT sessions.  Goal status: met    2.  Patient will ambulate community and household distances with LRAD.  Baseline:  Status: 12/13/21: uses SPC at grocery store , mostly no AD use in home  Goal status: MET   3.  Patient will complete 5 x STS in </= 20 seconds to improve her functional strength.   Baseline:  Goal status: MET    4.  Patient will demonstrate 4/5 hip flexor strength to improve ability to negotiate stairs and curbs. Baseline: R 3+/5, L 4-/5 Goal status: met    5.  Patient will demonstrate at least 4-/5 bilateral hip abductor strength to improve stability about the chain with walking activity.  Baseline: 3-/5 bilateral Goal status: partially met    6.  Patient will tolerate at least 30 minutes of standing activity in order to cook for her family.  Baseline: see above  Status: 12/13/21: can tolerate 15-20 minutes  Status: 01/04/22: can tolerate 30 minutes in store and kitchen Goal status: MET             7. Patient will score at least 57% on FOTO to signify clinically meaningful improvement in functional abilities.                       Baseline: 41% Status: 11/30/21: 54%        Status: 12/13/21: 60%                      Goal status: MET   8. Patient will report no limitations with cleaning activities around her home as it relates to her back.    Baseline: has not completed sweeping, mopping, vacuuming, and cleaning bath tub  Goal Status: met   PLAN: PT FREQUENCY: n/a   PT DURATION: n/a   PLANNED INTERVENTIONS: Therapeutic  exercises, Therapeutic activity, Neuromuscular re-education, Balance training, Gait training, Patient/Family education, Self Care, Stair training, DME instructions, Cryotherapy, Moist heat, Manual therapy, and Re-evaluation.   PLAN FOR NEXT SESSION: n/a Gwendolyn Grant, PT, DPT, ATC 02/04/22 10:04 AM

## 2022-02-14 ENCOUNTER — Ambulatory Visit: Payer: Medicare Other

## 2022-02-24 ENCOUNTER — Ambulatory Visit
Admission: RE | Admit: 2022-02-24 | Discharge: 2022-02-24 | Disposition: A | Discharge status: Home / Self Care | Payer: Medicare Other | Source: Ambulatory Visit | Attending: Family Medicine | Admitting: Family Medicine

## 2022-02-24 DIAGNOSIS — Z1231 Encounter for screening mammogram for malignant neoplasm of breast: Secondary | ICD-10-CM

## 2022-03-18 ENCOUNTER — Ambulatory Visit
Admission: RE | Admit: 2022-03-18 | Discharge: 2022-03-18 | Disposition: A | Discharge status: Home / Self Care | Payer: Medicare Other | Source: Ambulatory Visit | Attending: Family Medicine | Admitting: Family Medicine

## 2022-03-18 ENCOUNTER — Other Ambulatory Visit: Payer: Self-pay | Admitting: Family Medicine

## 2022-03-18 DIAGNOSIS — M25552 Pain in left hip: Secondary | ICD-10-CM

## 2022-04-13 ENCOUNTER — Ambulatory Visit: Payer: Medicare Other | Admitting: Orthopaedic Surgery

## 2022-05-02 ENCOUNTER — Encounter: Payer: Self-pay | Admitting: Orthopaedic Surgery

## 2022-05-02 ENCOUNTER — Ambulatory Visit (INDEPENDENT_AMBULATORY_CARE_PROVIDER_SITE_OTHER): Payer: 59 | Admitting: Orthopaedic Surgery

## 2022-05-02 DIAGNOSIS — M25551 Pain in right hip: Secondary | ICD-10-CM | POA: Diagnosis not present

## 2022-05-02 NOTE — Progress Notes (Signed)
The patient is someone who has a history of extensive lumbar spine surgery done in May of this past year by Dr. Louanne Skye.  She does have nagging pain in both hips from time to time more so on the right than left and some of the pains in the groin.  Right now she is not interested in either surgery as she has been continuing to recover from her back surgery.  She was sent to me to take a look at her hips.  She does have x-rays of her pelvis and hips within the last 6 months for me to review.  On exam both hips move smoothly and fluidly with no blocks or rotation.  There is a little bit more pain on the right side than the left in terms of pain in the groin.  An AP pelvis and lateral both hips on the canopy system shows definitely joint space narrowing on the right hip comparing the right and left hips.  Left hip looks more normal.  The right hip definitely shows some overhang of osteophytes and again narrowing.  If it gets to where the pain in her groin is bothering her enough, we would consider her for a right hip replacement.  Right now that is not the case for her and she will let us know if things worsen.  All question concerns were answered addressed.  Follow-up is as needed.

## 2022-05-11 ENCOUNTER — Ambulatory Visit (INDEPENDENT_AMBULATORY_CARE_PROVIDER_SITE_OTHER): Payer: 59 | Admitting: Orthopedic Surgery

## 2022-05-11 ENCOUNTER — Ambulatory Visit (INDEPENDENT_AMBULATORY_CARE_PROVIDER_SITE_OTHER): Payer: 59

## 2022-05-11 ENCOUNTER — Encounter: Payer: Self-pay | Admitting: Orthopedic Surgery

## 2022-05-11 VITALS — BP 118/71 | HR 80 | Ht 63.0 in | Wt 145.0 lb

## 2022-05-11 DIAGNOSIS — Z981 Arthrodesis status: Secondary | ICD-10-CM

## 2022-05-11 NOTE — Progress Notes (Addendum)
Orthopedic Spine Surgery Office Note  Assessment: Patient is a 67 y.o. female who is presenting for routine follow-up after a L3-5 TLIF and posterior instrumented spinal fusion with Dr. Louanne Skye.  She is not having any leg pain and only having periodic right sided back pain   Plan: -Operative plans complete -Out of bed as tolerated, no brace -Activity as tolerated, no spine specific restrictions -Can use Tylenol or ibuprofen as needed for the occasional back pain -Recommended she continue with the home exercises previously prescribed by physical therapy -Patient should return to office in 26 weeks, x-rays at next visit: AP/lateral/flex/ex lumbar   Patient expressed understanding of the plan and all questions were answered to the patient's satisfaction.   ___________________________________________________________________________   History:  Patient is a 67 y.o. female who presents today for lumbar spine.  Patient underwent L3-5 TLIF and posterior instrument spinal fusion with Dr. Louanne Skye on 08/31/2021.  She has been doing well since surgery.  She does have some occasional right-sided paraspinal pain in the lumbar spine.  She feels that if she is particularly active.  She says that she did not have this when she was doing her home exercises.  She has quit doing those within the last couple months.  She does not have any radiating pain into the legs.  Has not noticed any redness or drainage around her incision.   Weakness: Denies Symptoms of imbalance: Denies Paresthesias and numbness: Denies Bowel or bladder incontinence: Denies Saddle anesthesia: Denies  Review of systems: Denies fevers and chills, night sweats, unexplained weight loss, pain that wakes them at night  Past medical history: HLD HTN Chronic pain DM (last A1C was 5.8 on 08/26/2021) Osteoarthritis  Allergies: penicillin  Past surgical history:  L3-5 TLIF and PSIF Cholecystectomy Hysterectomy Tubal ligation  Social  history: Denies use of nicotine product (smoking, vaping, patches, smokeless) Alcohol use: denies Denies recreational drug use   Physical Exam:  General: no acute distress, appears stated age Neurologic: alert, answering questions appropriately, following commands Respiratory: unlabored breathing on room air, symmetric chest rise Psychiatric: appropriate affect, normal cadence to speech   MSK (spine):  -Strength exam      Left  Right EHL    4/5  4/5 TA    5/5  5/5 GSC    5/5  5/5 Knee extension  5/5  5/5 Hip flexion   5/5  5/5  -Sensory exam    Sensation intact to light touch in L3-S1 nerve distributions of bilateral lower extremities  Imaging: XR of the lumbar spine from 05/11/2022 was independently reviewed and interpreted, showing L3-5 screws in place with no lucency or backing out. The L3/4 TLIF cage appears to violate the inferior end plate of L3. Lumbar scoliosis with apex to the right at L2/3. PI of 72, LL of 50.    Patient name: Carol Wilkins Patient MRN: [id number] Date of visit: 05/11/22

## 2022-11-09 ENCOUNTER — Ambulatory Visit (INDEPENDENT_AMBULATORY_CARE_PROVIDER_SITE_OTHER): Payer: 59 | Admitting: Orthopedic Surgery

## 2022-11-09 ENCOUNTER — Ambulatory Visit: Payer: 59 | Admitting: Orthopedic Surgery

## 2022-11-09 ENCOUNTER — Other Ambulatory Visit (INDEPENDENT_AMBULATORY_CARE_PROVIDER_SITE_OTHER): Payer: 59

## 2022-11-09 DIAGNOSIS — Z981 Arthrodesis status: Secondary | ICD-10-CM | POA: Diagnosis not present

## 2022-11-09 NOTE — Progress Notes (Signed)
Orthopedic Spine Surgery Office Note   Assessment: Patient is a 67 y.o. female who is presenting for routine follow-up after a L3-5 TLIF and posterior instrumented spinal fusion with Dr. Otelia Sergeant.  Has low back pain but is tolerable with Tylenol.  No radiating leg pain.     Plan: -No operative plans at this time -Told her she can use 1000 mg of Tylenol 3 times daily -No spine restrictions at this point -She should continue with the home exercises previously prescribed by physical therapy -Patient should return to office in 6 months, x-rays at next visit: AP/lateral/flex/ex lumbar     Patient expressed understanding of the plan and all questions were answered to the patient's satisfaction.    ___________________________________________________________________________     History:   Patient is a 67 y.o. female who presents today for lumbar spine.  Patient underwent L3-5 TLIF and posterior instrument spinal fusion with Dr. Otelia Sergeant on 08/31/2021.  She has had low back pain on a daily basis. She said it is currently tolerable with tylenol. She is not having any pain radiating into either lower extremity. Denies paresthesias and numbness. No bowel or bladder incontinence. No saddle anesthesia.     Physical Exam:   General: no acute distress, appears stated age Neurologic: alert, answering questions appropriately, following commands Respiratory: unlabored breathing on room air, symmetric chest rise Psychiatric: appropriate affect, normal cadence to speech     MSK (spine):   -Strength exam                                                   Left                  Right EHL                              4/5                  4/5 TA                                 5/5                  5/5 GSC                             5/5                  5/5 Knee extension            5/5                  5/5 Hip flexion                    5/5                  5/5   -Sensory exam                            Sensation intact to light touch in L3-S1 nerve distributions of bilateral lower extremities   Imaging: XR of the lumbar spine from 11/09/2022 was independently reviewed and interpreted, showing L3-5  posterior instrumentation in place with no lucency around the screws. None of the screws are backing out. No evidence of instability on flexion/extension views. The L3/4 TLIF cage appears to violate the inferior end plate of L3. Lumbar scoliosis with apex to the right at L2/3.      Patient name: Carol Wilkins Patient MRN: [id number] Date of visit: 11/09/22

## 2022-12-12 ENCOUNTER — Ambulatory Visit: Payer: 59 | Admitting: Podiatry

## 2022-12-12 ENCOUNTER — Encounter: Payer: Self-pay | Admitting: Podiatry

## 2022-12-12 DIAGNOSIS — M76822 Posterior tibial tendinitis, left leg: Secondary | ICD-10-CM

## 2022-12-12 DIAGNOSIS — M76821 Posterior tibial tendinitis, right leg: Secondary | ICD-10-CM

## 2022-12-12 DIAGNOSIS — M2141 Flat foot [pes planus] (acquired), right foot: Secondary | ICD-10-CM | POA: Diagnosis not present

## 2022-12-12 DIAGNOSIS — M779 Enthesopathy, unspecified: Secondary | ICD-10-CM

## 2022-12-12 DIAGNOSIS — M2142 Flat foot [pes planus] (acquired), left foot: Secondary | ICD-10-CM | POA: Diagnosis not present

## 2022-12-12 NOTE — Progress Notes (Signed)
Subjective:   Patient ID: Carol Wilkins, female   DOB: 67 y.o.   MRN: [id number]   HPI Patient presents stating she is having generalized pain in her feet and knows that she is flat-footed and is also on her feet quite a bit with activity.  No other changes health history   ROS      Objective:  Physical Exam  Neurovascular status intact with significant diminishment of arch height bilateral inflammation around the posterior tibial tendon bilateral forefoot inflammation localized to the MPJs and history of neuropathic leg disease with back issues     Assessment:  Inflammatory condition secondary to foot structure with posterior tibial tendinitis along with inflammatory capsulitis and neuropathy bilateral      Plan:  H&P reviewed all conditions discussed.  At this point we are going to try a well supported over-the-counter insole in conjunction with good shoes stretching exercises.  If symptoms persist may have to become more aggressive but hopefully this will keep it under control

## 2023-01-17 ENCOUNTER — Other Ambulatory Visit: Payer: Self-pay | Admitting: Family Medicine

## 2023-01-17 DIAGNOSIS — Z1231 Encounter for screening mammogram for malignant neoplasm of breast: Secondary | ICD-10-CM

## 2023-02-27 ENCOUNTER — Ambulatory Visit
Admission: RE | Admit: 2023-02-27 | Discharge: 2023-02-27 | Disposition: A | Discharge status: Home / Self Care | Payer: 59 | Source: Ambulatory Visit | Attending: Family Medicine | Admitting: Family Medicine

## 2023-02-27 DIAGNOSIS — Z1231 Encounter for screening mammogram for malignant neoplasm of breast: Secondary | ICD-10-CM

## 2023-03-23 ENCOUNTER — Ambulatory Visit: Payer: 59 | Admitting: Orthopedic Surgery

## 2023-03-23 ENCOUNTER — Other Ambulatory Visit (INDEPENDENT_AMBULATORY_CARE_PROVIDER_SITE_OTHER): Payer: 59

## 2023-03-23 DIAGNOSIS — Z981 Arthrodesis status: Secondary | ICD-10-CM | POA: Diagnosis not present

## 2023-03-23 MED ORDER — CYCLOBENZAPRINE HCL 10 MG PO TABS: 10.0000 mg | ORAL_TABLET | Freq: Three times a day (TID) | ORAL | 0 refills | Status: AC | PRN

## 2023-03-23 NOTE — Progress Notes (Signed)
Orthopedic Spine Surgery Office Note   Assessment: Patient is a 67 y.o. female who is presenting for routine follow-up after a L3-5 TLIF and posterior instrumented spinal fusion with Dr. Otelia Sergeant.  Has acute worsening of her chronic low back pain     Plan: -No operative plans at this time -Told her she can use 1000 mg of Tylenol 3 times daily -Prescribed flexeril. Told her we could do a medrol dosepak too. She will call if the flexeril is not giving her adequate relief and we will try the dose pak -Patient should return to office in 6 months, x-rays at next visit: AP/lateral/flex/ex lumbar     Patient expressed understanding of the plan and all questions were answered to the patient's satisfaction.    ___________________________________________________________________________     History:   Patient is a 67 y.o. female who presents today for follow up on her lumbar spine.  Patient underwent L3-5 TLIF and posterior instrument spinal fusion with Dr. Otelia Sergeant on 08/31/2021. She had chronic low back pain after th surgery but her pain has acutely worsened recently. Previously, tylenol controlled her pain but that is not currently giving her adequate relief. She feels the pain in the right lumbar spine near the L2 or L3 area. No pain on the left side. No pain radiating into either lower extremity. There was no trauma or injury that preceded the onset of this worsening pain.     Physical Exam:   General: no acute distress, appears stated age Neurologic: alert, answering questions appropriately, following commands Respiratory: unlabored breathing on room air, symmetric chest rise Psychiatric: appropriate affect, normal cadence to speech     MSK (spine):   -Strength exam                                                   Left                  Right EHL                              4/5                  4/5 TA                                 5/5                  5/5 GSC                              5/5                  5/5 Knee extension            5/5                  5/5 Hip flexion                    5/5                  5/5   -Sensory exam  Sensation intact to light touch in L3-S1 nerve distributions of bilateral lower extremities   Imaging: XRs of the lumbar spine from 03/23/2023 were independently reviewed and interpreted, showing L3-5 posterior instrumentation in place with no lucency around the screws. Screws and interbody devices are in similar position to films from 10/2022.  No evidence of instability on flexion/extension views.     Patient name: Carol Wilkins Patient MRN: [id number] Date of visit: 03/23/23

## 2023-05-11 ENCOUNTER — Ambulatory Visit: Payer: 59 | Admitting: Orthopedic Surgery

## 2023-09-21 ENCOUNTER — Other Ambulatory Visit (INDEPENDENT_AMBULATORY_CARE_PROVIDER_SITE_OTHER)

## 2023-09-21 ENCOUNTER — Ambulatory Visit (INDEPENDENT_AMBULATORY_CARE_PROVIDER_SITE_OTHER): Payer: 59 | Admitting: Orthopedic Surgery

## 2023-09-21 DIAGNOSIS — Z981 Arthrodesis status: Secondary | ICD-10-CM

## 2023-09-21 NOTE — Progress Notes (Signed)
 Orthopedic Spine Surgery Office Note   Assessment: Patient is a 68 y.o. female who is presenting for routine follow-up after a L3-5 TLIF and posterior instrumented spinal fusion with Dr. Richardo Chandler.  Is doing well with some periodic back soreness. No radicular pain     Plan: -No operative plans at this time -Pain control: can continue with tylenol  as needed -Patient should return to office in 6 months, x-rays at next visit: AP/lateral/flex/ex lumbar     Patient expressed understanding of the plan and all questions were answered to the patient's satisfaction.    ___________________________________________________________________________     History:   Patient is a 68 y.o. female who presents today for follow up on her lumbar spine. Patient underwent L3-5 TLIF with Dr. Richardo Chandler on 08/31/2021.  Patient says she gets some periodic low back pain if she is active around the house but otherwise does not deal with any regular back pain.  No pain rating into either lower extremity.  He has been using Tylenol  for pain control and feels that it makes it tolerable.  She is also will sit down if her back starts hurting and that helps.     Physical Exam:   General: no acute distress, appears stated age Neurologic: alert, answering questions appropriately, following commands Respiratory: unlabored breathing on room air, symmetric chest rise Psychiatric: appropriate affect, normal cadence to speech     MSK (spine):   -Strength exam                                                   Left                  Right EHL                              4/5                  4/5 TA                                 5/5                  5/5 GSC                             5/5                  5/5 Knee extension            5/5                  5/5 Hip flexion                    5/5                  5/5   -Sensory exam                           Sensation intact to light touch in L3-S1 nerve distributions of bilateral  lower extremities   Imaging: XRs of the lumbar spine from 09/21/2023 were independently reviewed and interpreted, showing lumbar degenerative scoliosis with  apex to the right.  Posterior spinal instrumentation from L1 3 to L5.  No lucency seen around the screws.  No screws backed out.  There are interbody devices at L3/4 and L4/5.  The L4/5 interbody appears to have subsided into the L5 vertebra and the L3/4 appears to have subsided within the L3 vertebra.  No fracture or dislocation seen.  No evidence of instability on flexion/extension views.     Patient name: Carol Wilkins Patient MRN: [id number] Date of visit: 09/21/23

## 2024-02-19 ENCOUNTER — Encounter: Payer: Self-pay | Admitting: Radiology

## 2024-03-21 ENCOUNTER — Ambulatory Visit: Admitting: Orthopedic Surgery

## 2024-03-29 ENCOUNTER — Other Ambulatory Visit: Payer: Self-pay | Admitting: Family Medicine

## 2024-03-29 DIAGNOSIS — Z1231 Encounter for screening mammogram for malignant neoplasm of breast: Secondary | ICD-10-CM

## 2024-04-05 ENCOUNTER — Other Ambulatory Visit: Payer: Self-pay | Admitting: Medical Genetics

## 2024-04-12 ENCOUNTER — Ambulatory Visit
Admission: RE | Admit: 2024-04-12 | Discharge: 2024-04-12 | Disposition: A | Discharge status: Home / Self Care | Source: Ambulatory Visit | Attending: Family Medicine | Admitting: Family Medicine

## 2024-04-12 DIAGNOSIS — Z1231 Encounter for screening mammogram for malignant neoplasm of breast: Secondary | ICD-10-CM

## 2024-05-13 ENCOUNTER — Ambulatory Visit: Admitting: Orthopedic Surgery

## 2024-05-22 ENCOUNTER — Other Ambulatory Visit: Payer: Self-pay | Admitting: Medical Genetics

## 2024-05-22 DIAGNOSIS — Z006 Encounter for examination for normal comparison and control in clinical research program: Secondary | ICD-10-CM

## 2024-05-23 ENCOUNTER — Ambulatory Visit: Admitting: Orthopedic Surgery

## 2024-05-23 ENCOUNTER — Other Ambulatory Visit: Payer: Self-pay

## 2024-05-23 DIAGNOSIS — Z981 Arthrodesis status: Secondary | ICD-10-CM

## 2024-05-23 NOTE — Progress Notes (Signed)
 Orthopedic Spine Surgery Office Note   Assessment: Patient is a 69 y.o. female who is presenting for routine follow-up after a L3-5 TLIF and posterior instrumented spinal fusion with Dr. Lucilla. Continues to do well. No complaints at this time.     Plan: -No operative plans at this time -No spine specific precautions -Pain control: can continue with tylenol  as needed -Patient should return to office in 6 months, x-rays at next visit: AP/lateral/flex/ex lumbar     Patient expressed understanding of the plan and all questions were answered to the patient's satisfaction.    ___________________________________________________________________________     History:   Patient is a 69 y.o. female who presents today for follow up on her lumbar spine. Patient is now about 2.5 years out from surgery with Dr. Lucilla. She says she has mild back pain every once awhile but nothing more significant than that. She is not having any radiating leg pain. Her pain has been well controlled with tylenol .      Physical Exam:   General: no acute distress, appears stated age Neurologic: alert, answering questions appropriately, following commands Respiratory: unlabored breathing on room air, symmetric chest rise Psychiatric: appropriate affect, normal cadence to speech     MSK (spine):   -Strength exam                                                   Left                  Right EHL                              4/5                  4/5 TA                                 5/5                  5/5 GSC                             5/5                  5/5 Knee extension            5/5                  5/5 Hip flexion                    5/5                  5/5   -Sensory exam                           Sensation intact to light touch in L3-S1 nerve distributions of bilateral lower extremities   Imaging: XRs of the lumbar spine from 05/23/2024 were independently reviewed and interpreted, showing interbody  devices at L3/4 and L4/5. Interbodies are similar position to prior films - subsidence seen with both interbodies. There is posterior instrumentation from L3-5. No lucency seen around the screws. Disc height  loss at L5/S1. No fracture or dislocation seen. Thoracolumbar scoliosis. No evidence of instability on flexion/extension views.      Patient name: Carol Wilkins Patient MRN: [id number] Date of visit: 05/23/24

## 2024-05-24 ENCOUNTER — Emergency Department (HOSPITAL_COMMUNITY)

## 2024-05-24 ENCOUNTER — Emergency Department (HOSPITAL_COMMUNITY)
Admission: EM | Admit: 2024-05-24 | Discharge: 2024-05-24 | Disposition: A | Discharge status: Home / Self Care | Source: Ambulatory Visit | Attending: Emergency Medicine | Admitting: Emergency Medicine

## 2024-05-24 DIAGNOSIS — R1013 Epigastric pain: Secondary | ICD-10-CM

## 2024-05-24 DIAGNOSIS — R7989 Other specified abnormal findings of blood chemistry: Secondary | ICD-10-CM

## 2024-05-24 LAB — CBC
HCT: 37.7 % (ref 36.0–46.0)
Hemoglobin: 11.7 g/dL — ABNORMAL LOW (ref 12.0–15.0)
MCH: 27.2 pg (ref 26.0–34.0)
MCHC: 31 g/dL (ref 30.0–36.0)
MCV: 87.7 fL (ref 80.0–100.0)
Platelets: 187 10*3/uL (ref 150–400)
RBC: 4.3 MIL/uL (ref 3.87–5.11)
RDW: 15.1 % (ref 11.5–15.5)
WBC: 3.6 10*3/uL — ABNORMAL LOW (ref 4.0–10.5)
nRBC: 0 % (ref 0.0–0.2)

## 2024-05-24 LAB — BASIC METABOLIC PANEL WITH GFR
Anion gap: 11 (ref 5–15)
BUN: 16 mg/dL (ref 8–23)
CO2: 26 mmol/L (ref 22–32)
Calcium: 10.3 mg/dL (ref 8.9–10.3)
Chloride: 106 mmol/L (ref 98–111)
Creatinine, Ser: 0.9 mg/dL (ref 0.44–1.00)
GFR, Estimated: >60 mL/min
Glucose, Bld: 78 mg/dL (ref 70–99)
Potassium: 3.8 mmol/L (ref 3.5–5.1)
Sodium: 143 mmol/L (ref 135–145)

## 2024-05-24 LAB — TROPONIN T, HIGH SENSITIVITY
Troponin T High Sensitivity: 25 ng/L — ABNORMAL HIGH (ref 0–19)
Troponin T High Sensitivity: 21 ng/L — ABNORMAL HIGH (ref 0–19)

## 2024-05-24 MED ORDER — ACETAMINOPHEN 500 MG PO TABS: 1000.0000 mg | ORAL_TABLET | Freq: Once | ORAL | Status: DC

## 2024-05-24 NOTE — ED Provider Notes (Cosign Needed)
 " [place] [facility] AT [facility] Provider Note   CSN: [id number] Arrival date & time: 05/24/24  1236     Patient presents with: Chest Pain   Carol Wilkins is a 69 y.o. female.   69 year old female presenting with chest pain.  Patient notes that her symptoms began last week when her son passed away, she describes pain in the inferior substernal/epigastric that is sharp at times and intermittent in nature, pain is not currently present.  Reports increased belching as well.  Pain is not exacerbated by exertion, seems to come on randomly.  Denies shortness of breath, nausea/vomiting/diarrhea, fever.   Chest Pain      Prior to Admission medications  Medication Sig Start Date End Date Taking? Authorizing Provider  acetaminophen  (TYLENOL ) 500 MG tablet Take 2 tablets (1,000 mg total) by mouth every 6 (six) hours as needed. 02/01/20   Armenta Canning, MD  amLODipine  (NORVASC ) 10 MG tablet Take 0.5 tablets (5 mg total) by mouth daily. 09/03/21   Nitka, James E, MD  atorvastatin  (LIPITOR) 20 MG tablet Take 1 tablet by mouth daily. 07/23/20   [provider]  Azilsartan Medoxomil 40 MG TABS Take 40 mg by mouth daily.    [provider]  buPROPion  (WELLBUTRIN  SR) 150 MG 12 hr tablet Take 150 mg by mouth daily. 06/01/21   [provider]  cyclobenzaprine  (FLEXERIL ) 10 MG tablet Take 1 tablet (10 mg total) by mouth 3 (three) times daily as needed (pain, muscle spasms). 03/23/23   Georgina Ozell LABOR, MD  fexofenadine (ALLEGRA) 180 MG tablet Take 180 mg by mouth daily as needed for allergies or rhinitis.    [provider]  fluticasone  (FLONASE ) 50 MCG/ACT nasal spray Place 1 spray into both nostrils daily as needed for allergies or rhinitis.    [provider]  gabapentin  (NEURONTIN ) 100 MG capsule TAKE 1 CAPSULE(100 MG) BY MOUTH AT BEDTIME 06/30/21   Nitka, James E, MD  gabapentin  (NEURONTIN ) 300 MG capsule TAKE 1 CAPSULE(300 MG)  BY MOUTH TWICE DAILY 11/10/21   Nitka, James E, MD  hydrochlorothiazide  (HYDRODIURIL ) 25 MG tablet Take 25 mg by mouth daily.  02/23/17   [provider]  metFORMIN (GLUCOPHAGE) 500 MG tablet Take 500 mg by mouth daily.    [provider]  pregabalin  (LYRICA ) 75 MG capsule Take 1 capsule (75 mg total) by mouth 2 (two) times daily. 10/14/21   Nitka, James E, MD  Semaglutide ,0.25 or 0.5MG /DOS, (OZEMPIC , 0.25 OR 0.5 MG/DOSE,) 2 MG/1.5ML SOPN Inject 0.5 mg into the skin every Thursday.    [provider]  Vitamin D , Ergocalciferol , (DRISDOL ) 1.25 MG (50000 UNIT) CAPS capsule Take 50,000 Units by mouth every Wednesday. 07/15/21   [provider]    Allergies: Penicillins    Review of Systems  Cardiovascular:  Positive for chest pain.    Updated Vital Signs  Vitals:   05/24/24 1242  BP: (!) 155/68  Pulse: 81  Resp: 19  Temp: 98.5 F (36.9 C)  TempSrc: Oral  SpO2: 100%     Physical Exam Vitals and nursing note reviewed.  Constitutional:      Appearance: She is well-developed.  HENT:     Head: Normocephalic and atraumatic.  Eyes:     Extraocular Movements: Extraocular movements intact.     Pupils: Pupils are equal, round, and reactive to light.  Cardiovascular:     Rate and Rhythm: Normal rate and regular rhythm.     Pulses:  Radial pulses are 2+ on the right side and 2+ on the left side.     Heart sounds: Normal heart sounds.  Pulmonary:     Effort: Pulmonary effort is normal.     Breath sounds: Normal breath sounds.  Musculoskeletal:     Cervical back: Normal range of motion.     Right lower leg: No edema.     Left lower leg: No edema.     Comments: Moves all extremities spontaneously without difficulty  Skin:    General: Skin is warm and dry.  Neurological:     General: No focal deficit present.     Mental Status: She is alert and oriented to person, place, and time.     (all labs ordered are listed, but only abnormal results  are displayed) Labs Reviewed  CBC - Abnormal; Notable for the following components:      Result Value   WBC 3.6 (*)    Hemoglobin 11.7 (*)    All other components within normal limits  TROPONIN T, HIGH SENSITIVITY - Abnormal; Notable for the following components:   Troponin T High Sensitivity 25 (*)    All other components within normal limits  BASIC METABOLIC PANEL WITH GFR  TROPONIN T, HIGH SENSITIVITY    EKG: None  Radiology: XR Lumbar Spine Complete Result Date: 05/23/2024 XRs of the lumbar spine from 05/23/2024 were independently reviewed and interpreted, showing interbody devices at L3/4 and L4/5. Interbodies are similar position to prior films - subsidence seen with both interbodies. There is posterior instrumentation from L3-5. No lucency seen around the screws. Disc height loss at L5/S1. No fracture or dislocation seen. Thoracolumbar scoliosis. No evidence of instability on flexion/extension views.     Procedures   Medications Ordered in the ED - No data to display                                  Medical Decision Making This patient presents to the ED for concern of chest pain, this involves an extensive number of treatment options, and is a complaint that carries with it a high risk of complications and morbidity.  The differential diagnosis includes ACS, arrhythmia/dysrhythmia, GERD, anxiety, stress   Co morbidities that complicate the patient evaluation  Hypertension   Additional history obtained:  Additional history obtained from record review External records from outside source obtained and reviewed including prior orthopedic note   Lab Tests:  I Ordered, and personally interpreted labs.  The pertinent results include: CBC with leukopenia with white blood cell count 3.6, hemoglobin of 11.7 is improved from previous.  BMP within normal limits.  Initial troponin of 25.     Imaging Studies ordered:  I ordered imaging studies including CXR  I  independently visualized and interpreted imaging which showed 1. No acute cardiopulmonary abnormality. 2. Aortic Atherosclerosis (ICD10-I70.0).  I agree with the radiologist interpretation   Cardiac Monitoring: / EKG:  The patient was maintained on a cardiac monitor.  I personally viewed and interpreted the cardiac monitored which showed an underlying rhythm of: NSR    Problem List / ED Course / Critical interventions / Medication management I have reviewed the patients home medicines and have made adjustments as needed   Social Determinants of Health:  Former tobacco use   Test / Admission - Considered:  Physical exam is notable as above, patient is well-appearing and in no acute distress.  Patient notes  recent loss of her son last week, around the time of her symptom onset.  Patient believes that there may be an anxiety component to her symptoms, but was sent by her PCPs office due to abnormal EKG in their office today.  Will proceed with ACS workup. Labs are notable as above, initial troponin 25, patient is not actively having chest pain however given new elevation in troponin I do feel that she should stay for collection of second troponin.  Patient handed off to Lonni Camp PA-C at shift change, see their note for remainder of work up as well as discussion regarding plan/dispo.      Amount and/or Complexity of Data Reviewed Labs: ordered. Radiology: ordered.        Final diagnoses:  None    ED Discharge Orders     None          Glendia Rocky SAILOR, [place] 05/24/24 1515  "

## 2024-05-24 NOTE — ED Provider Notes (Signed)
 Epigastric pain x 1 week. Son recently passed away.  Awaits 2nd trop.    Second troponin is 21 improved from prior.  Patient does not endorse any active chest pain.  Does endorse some headache due to pain had food throughout the day today.  Tylenol  given.  Patient stable for discharge.  Outpatient follow-up recommended.  Will also give cardiology referral.  BP 137/63 (BP Location: Left Arm)   Pulse 68   Temp 98.1 F (36.7 C) (Oral)   Resp 16   SpO2 100%   Results for orders placed or performed during the hospital encounter of 05/24/24  Basic metabolic panel   Collection Time: 05/24/24  2:18 PM  Result Value Ref Range   Sodium 143 135 - 145 mmol/L   Potassium 3.8 3.5 - 5.1 mmol/L   Chloride 106 98 - 111 mmol/L   CO2 26 22 - 32 mmol/L   Glucose, Bld 78 70 - 99 mg/dL   BUN 16 8 - 23 mg/dL   Creatinine, Ser 9.09 0.44 - 1.00 mg/dL   Calcium  10.3 8.9 - 10.3 mg/dL   GFR, Estimated >39 >39 mL/min   Anion gap 11 5 - 15  CBC   Collection Time: 05/24/24  2:18 PM  Result Value Ref Range   WBC 3.6 (L) 4.0 - 10.5 K/uL   RBC 4.30 3.87 - 5.11 MIL/uL   Hemoglobin 11.7 (L) 12.0 - 15.0 g/dL   HCT 62.2 63.9 - 53.9 %   MCV 87.7 80.0 - 100.0 fL   MCH 27.2 26.0 - 34.0 pg   MCHC 31.0 30.0 - 36.0 g/dL   RDW 84.8 88.4 - 84.4 %   Platelets 187 150 - 400 K/uL   nRBC 0.0 0.0 - 0.2 %  Troponin T, High Sensitivity   Collection Time: 05/24/24  2:18 PM  Result Value Ref Range   Troponin T High Sensitivity 25 (H) 0 - 19 ng/L  Troponin T, High Sensitivity   Collection Time: 05/24/24  4:32 PM  Result Value Ref Range   Troponin T High Sensitivity 21 (H) 0 - 19 ng/L   DG Chest 2 View Result Date: 05/24/2024 EXAM: 2 VIEW(S) XRAY OF THE CHEST 05/24/2024 01:15:06 PM COMPARISON: 02/01/2020. CLINICAL HISTORY: Chest pain. FINDINGS: LUNGS AND PLEURA: No focal pulmonary opacity. No pleural effusion. No pneumothorax. HEART AND MEDIASTINUM: No acute abnormality of the cardiac and mediastinal silhouettes. Aortic  atherosclerosis. BONES AND SOFT TISSUES: S-shaped thoracolumbar curvature with lumbar fusion hardware and degenerative changes. Cholecystectomy clips noted. IMPRESSION: 1. No acute cardiopulmonary abnormality. 2. Aortic Atherosclerosis (ICD10-I70.0). Electronically signed by: Ryan Chess MD 05/24/2024 02:29 PM EST RP Workstation: [id number]   XR Lumbar Spine Complete Result Date: 05/23/2024 XRs of the lumbar spine from 05/23/2024 were independently reviewed and interpreted, showing interbody devices at L3/4 and L4/5. Interbodies are similar position to prior films - subsidence seen with both interbodies. There is posterior instrumentation from L3-5. No lucency seen around the screws. Disc height loss at L5/S1. No fracture or dislocation seen. Thoracolumbar scoliosis. No evidence of instability on flexion/extension views.      Nivia Colon, PA-C 05/24/24 [id number]    Ellouise, Victoria K, DO 05/24/24 [id number]

## 2024-05-24 NOTE — Discharge Instructions (Addendum)
 Your heart enzyme are a bit elevated today.  Our cardiology office will reach out to you for an outpatient follow-up.  Return if you have any concern.

## 2024-05-24 NOTE — ED Triage Notes (Signed)
 Pt reports lost her son last week and started having chest pain intermittently, was not sure if it was anxiety related. Went to PCP and had an EKG completed which they said was abnormal and they sent her here for further workout

## 2024-05-30 ENCOUNTER — Other Ambulatory Visit (HOSPITAL_COMMUNITY)

## 2024-11-20 ENCOUNTER — Ambulatory Visit: Admitting: Orthopedic Surgery
